# Patient Record
Sex: Male | Born: 1970
Health system: Southern US, Community
[De-identification: ages and names within clinical notes are randomized; demographics above are authoritative.]

## PROBLEM LIST (undated history)

## (undated) DIAGNOSIS — I2510 Atherosclerotic heart disease of native coronary artery without angina pectoris: Secondary | ICD-10-CM

## (undated) DIAGNOSIS — E119 Type 2 diabetes mellitus without complications: Secondary | ICD-10-CM

## (undated) DIAGNOSIS — E785 Hyperlipidemia, unspecified: Secondary | ICD-10-CM

## (undated) DIAGNOSIS — L409 Psoriasis, unspecified: Secondary | ICD-10-CM

## (undated) DIAGNOSIS — K219 Gastro-esophageal reflux disease without esophagitis: Secondary | ICD-10-CM

## (undated) DIAGNOSIS — Z720 Tobacco use: Secondary | ICD-10-CM

## (undated) DIAGNOSIS — I10 Essential (primary) hypertension: Secondary | ICD-10-CM

## (undated) DIAGNOSIS — I219 Acute myocardial infarction, unspecified: Secondary | ICD-10-CM

## (undated) HISTORY — DX: Type 2 diabetes mellitus without complications: E11.9

## (undated) HISTORY — DX: Psoriasis, unspecified: L40.9

## (undated) HISTORY — DX: Acute myocardial infarction, unspecified: I21.9

---

## 1999-05-16 ENCOUNTER — Emergency Department (HOSPITAL_COMMUNITY): Admission: EM | Admit: 1999-05-16 | Discharge: 1999-05-16 | Payer: Self-pay | Admitting: *Deleted

## 2001-07-13 ENCOUNTER — Encounter: Payer: Self-pay | Admitting: Emergency Medicine

## 2001-07-13 ENCOUNTER — Emergency Department (HOSPITAL_COMMUNITY): Admission: EM | Admit: 2001-07-13 | Discharge: 2001-07-13 | Payer: Self-pay | Admitting: Emergency Medicine

## 2003-02-06 ENCOUNTER — Emergency Department (HOSPITAL_COMMUNITY): Admission: EM | Admit: 2003-02-06 | Discharge: 2003-02-06 | Payer: Self-pay | Admitting: Emergency Medicine

## 2004-04-19 ENCOUNTER — Emergency Department (HOSPITAL_COMMUNITY): Admission: EM | Admit: 2004-04-19 | Discharge: 2004-04-19 | Payer: Self-pay | Admitting: Family Medicine

## 2004-04-27 ENCOUNTER — Emergency Department (HOSPITAL_COMMUNITY): Admission: EM | Admit: 2004-04-27 | Discharge: 2004-04-27 | Payer: Self-pay | Admitting: Family Medicine

## 2005-11-08 ENCOUNTER — Emergency Department (HOSPITAL_COMMUNITY): Admission: EM | Admit: 2005-11-08 | Discharge: 2005-11-08 | Payer: Self-pay | Admitting: Emergency Medicine

## 2007-04-28 ENCOUNTER — Emergency Department (HOSPITAL_COMMUNITY): Admission: EM | Admit: 2007-04-28 | Discharge: 2007-04-28 | Payer: Self-pay | Admitting: Family Medicine

## 2008-02-03 ENCOUNTER — Emergency Department (HOSPITAL_COMMUNITY): Admission: EM | Admit: 2008-02-03 | Discharge: 2008-02-03 | Payer: Self-pay | Admitting: Family Medicine

## 2010-03-16 ENCOUNTER — Emergency Department (HOSPITAL_COMMUNITY): Admission: EM | Admit: 2010-03-16 | Discharge: 2010-03-17 | Payer: Self-pay | Admitting: Emergency Medicine

## 2011-05-11 ENCOUNTER — Emergency Department (HOSPITAL_COMMUNITY)
Admission: EM | Admit: 2011-05-11 | Discharge: 2011-05-11 | Disposition: A | Discharge status: Home / Self Care | Payer: Commercial Managed Care - PPO | Attending: Emergency Medicine | Admitting: Emergency Medicine

## 2011-05-11 DIAGNOSIS — R109 Unspecified abdominal pain: Secondary | ICD-10-CM | POA: Insufficient documentation

## 2011-05-11 DIAGNOSIS — R110 Nausea: Secondary | ICD-10-CM | POA: Insufficient documentation

## 2011-05-11 LAB — COMPREHENSIVE METABOLIC PANEL
ALT: 30 U/L (ref 0–53)
AST: 26 U/L (ref 0–37)
Albumin: 3.7 g/dL (ref 3.5–5.2)
Alkaline Phosphatase: 78 U/L (ref 39–117)
BUN: 7 mg/dL (ref 6–23)
CO2: 27 mEq/L (ref 19–32)
Calcium: 9.2 mg/dL (ref 8.4–10.5)
Chloride: 102 mEq/L (ref 96–112)
Creatinine, Ser: 0.86 mg/dL (ref 0.50–1.35)
GFR calc Af Amer: >60 mL/min (ref >60)
GFR calc non Af Amer: >60 mL/min (ref >60)
Glucose, Bld: 106 mg/dL — ABNORMAL HIGH (ref 70–99)
Potassium: 4.1 mEq/L (ref 3.5–5.1)
Sodium: 138 mEq/L (ref 135–145)
Total Bilirubin: 0.3 mg/dL (ref 0.3–1.2)
Total Protein: 7 g/dL (ref 6.0–8.3)

## 2011-05-11 LAB — URINALYSIS, ROUTINE W REFLEX MICROSCOPIC
Glucose, UA: NEGATIVE mg/dL
Leukocytes, UA: NEGATIVE
Protein, ur: NEGATIVE mg/dL
Specific Gravity, Urine: 1.02 (ref 1.005–1.030)
pH: 7.5 (ref 5.0–8.0)

## 2011-05-11 LAB — URINE MICROSCOPIC-ADD ON

## 2011-05-11 LAB — DIFFERENTIAL
Basophils Absolute: 0 10*3/uL (ref 0.0–0.1)
Basophils Relative: 0 % (ref 0–1)
Eosinophils Absolute: 0 10*3/uL (ref 0.0–0.7)
Eosinophils Relative: 0 % (ref 0–5)
Lymphocytes Relative: 29 % (ref 12–46)
Lymphs Abs: 2.5 10*3/uL (ref 0.7–4.0)
Monocytes Absolute: 0.5 10*3/uL (ref 0.1–1.0)
Monocytes Relative: 5 % (ref 3–12)
Neutro Abs: 5.6 10*3/uL (ref 1.7–7.7)
Neutrophils Relative %: 65 % (ref 43–77)

## 2011-05-11 LAB — CBC
HCT: 39.9 % (ref 39.0–52.0)
Hemoglobin: 14.5 g/dL (ref 13.0–17.0)
MCH: 30.7 pg (ref 26.0–34.0)
MCHC: 36.3 g/dL — ABNORMAL HIGH (ref 30.0–36.0)
MCV: 84.5 fL (ref 78.0–100.0)
Platelets: 190 10*3/uL (ref 150–400)
RBC: 4.72 MIL/uL (ref 4.22–5.81)
RDW: 12.2 % (ref 11.5–15.5)
WBC: 8.6 10*3/uL (ref 4.0–10.5)

## 2011-05-12 ENCOUNTER — Emergency Department (HOSPITAL_COMMUNITY): Payer: Commercial Managed Care - PPO

## 2011-05-12 ENCOUNTER — Emergency Department (HOSPITAL_COMMUNITY)
Admission: EM | Admit: 2011-05-12 | Discharge: 2011-05-12 | Disposition: A | Discharge status: Home / Self Care | Payer: Commercial Managed Care - PPO | Attending: Emergency Medicine | Admitting: Emergency Medicine

## 2011-05-12 DIAGNOSIS — R110 Nausea: Secondary | ICD-10-CM | POA: Insufficient documentation

## 2011-05-12 DIAGNOSIS — R109 Unspecified abdominal pain: Secondary | ICD-10-CM | POA: Insufficient documentation

## 2011-05-12 LAB — POCT I-STAT, CHEM 8
Creatinine, Ser: 1.1 mg/dL (ref 0.50–1.35)
HCT: 42 % (ref 39.0–52.0)
Hemoglobin: 14.3 g/dL (ref 13.0–17.0)
Potassium: 3.3 mEq/L — ABNORMAL LOW (ref 3.5–5.1)
Sodium: 139 mEq/L (ref 135–145)
TCO2: 27 mmol/L (ref 0–100)

## 2011-05-12 LAB — CBC
Hemoglobin: 13.4 g/dL (ref 13.0–17.0)
MCH: 29.1 pg (ref 26.0–34.0)
MCHC: 34.2 g/dL (ref 30.0–36.0)
Platelets: 187 10*3/uL (ref 150–400)
RBC: 4.6 MIL/uL (ref 4.22–5.81)

## 2011-05-12 LAB — URINALYSIS, ROUTINE W REFLEX MICROSCOPIC
Hgb urine dipstick: NEGATIVE
Nitrite: NEGATIVE
Protein, ur: NEGATIVE mg/dL
Specific Gravity, Urine: 1.019 (ref 1.005–1.030)
Urobilinogen, UA: 0.2 mg/dL (ref 0.0–1.0)

## 2011-05-12 LAB — DIFFERENTIAL
Basophils Absolute: 0 10*3/uL (ref 0.0–0.1)
Basophils Relative: 0 % (ref 0–1)
Eosinophils Absolute: 0.1 10*3/uL (ref 0.0–0.7)
Monocytes Absolute: 0.5 10*3/uL (ref 0.1–1.0)
Monocytes Relative: 8 % (ref 3–12)
Neutro Abs: 3.7 10*3/uL (ref 1.7–7.7)
Neutrophils Relative %: 54 % (ref 43–77)

## 2012-12-20 DIAGNOSIS — I10 Essential (primary) hypertension: Secondary | ICD-10-CM | POA: Diagnosis present

## 2012-12-20 DIAGNOSIS — I214 Non-ST elevation (NSTEMI) myocardial infarction: Principal | ICD-10-CM | POA: Diagnosis present

## 2012-12-20 DIAGNOSIS — Z8249 Family history of ischemic heart disease and other diseases of the circulatory system: Secondary | ICD-10-CM

## 2012-12-20 DIAGNOSIS — K219 Gastro-esophageal reflux disease without esophagitis: Secondary | ICD-10-CM | POA: Diagnosis present

## 2012-12-20 DIAGNOSIS — F17200 Nicotine dependence, unspecified, uncomplicated: Secondary | ICD-10-CM | POA: Diagnosis present

## 2012-12-20 DIAGNOSIS — Z7982 Long term (current) use of aspirin: Secondary | ICD-10-CM

## 2012-12-20 DIAGNOSIS — I2510 Atherosclerotic heart disease of native coronary artery without angina pectoris: Secondary | ICD-10-CM | POA: Diagnosis present

## 2012-12-20 DIAGNOSIS — E785 Hyperlipidemia, unspecified: Secondary | ICD-10-CM | POA: Diagnosis present

## 2012-12-20 DIAGNOSIS — Z79899 Other long term (current) drug therapy: Secondary | ICD-10-CM

## 2012-12-20 NOTE — ED Notes (Signed)
Pt states for last month he has been having burning CP every other day.  Currently pain has come every day x 3 days. Pain intermittent. Pain exacerbated by exertion (walking). Activity at time of onset was sitting watching movie.

## 2012-12-20 NOTE — ED Notes (Signed)
Pain currently resolved

## 2012-12-21 ENCOUNTER — Encounter (HOSPITAL_COMMUNITY)
Admission: EM | Disposition: A | Discharge status: Home / Self Care | Payer: Self-pay | Source: Home / Self Care | Attending: Internal Medicine

## 2012-12-21 ENCOUNTER — Emergency Department (HOSPITAL_COMMUNITY)
Admit: 2012-12-21 | Discharge: 2012-12-21 | Disposition: A | Discharge status: Home / Self Care | Payer: Self-pay | Attending: Emergency Medicine | Admitting: Emergency Medicine

## 2012-12-21 ENCOUNTER — Encounter (HOSPITAL_COMMUNITY): Payer: Self-pay | Admitting: Internal Medicine

## 2012-12-21 ENCOUNTER — Inpatient Hospital Stay (HOSPITAL_COMMUNITY)
Admission: EM | Admit: 2012-12-21 | Discharge: 2012-12-22 | DRG: 247 | Disposition: A | Discharge status: Home / Self Care | Payer: Commercial Managed Care - PPO | Attending: Internal Medicine | Admitting: Internal Medicine

## 2012-12-21 ENCOUNTER — Other Ambulatory Visit: Payer: Self-pay

## 2012-12-21 DIAGNOSIS — I2510 Atherosclerotic heart disease of native coronary artery without angina pectoris: Secondary | ICD-10-CM

## 2012-12-21 DIAGNOSIS — I10 Essential (primary) hypertension: Secondary | ICD-10-CM | POA: Diagnosis present

## 2012-12-21 DIAGNOSIS — Z720 Tobacco use: Secondary | ICD-10-CM | POA: Diagnosis present

## 2012-12-21 DIAGNOSIS — E785 Hyperlipidemia, unspecified: Secondary | ICD-10-CM

## 2012-12-21 DIAGNOSIS — Z955 Presence of coronary angioplasty implant and graft: Secondary | ICD-10-CM

## 2012-12-21 DIAGNOSIS — K219 Gastro-esophageal reflux disease without esophagitis: Secondary | ICD-10-CM | POA: Diagnosis present

## 2012-12-21 DIAGNOSIS — I214 Non-ST elevation (NSTEMI) myocardial infarction: Secondary | ICD-10-CM

## 2012-12-21 DIAGNOSIS — R079 Chest pain, unspecified: Secondary | ICD-10-CM

## 2012-12-21 DIAGNOSIS — I252 Old myocardial infarction: Secondary | ICD-10-CM | POA: Diagnosis present

## 2012-12-21 HISTORY — PX: LEFT HEART CATHETERIZATION WITH CORONARY ANGIOGRAM: SHX5451

## 2012-12-21 HISTORY — DX: Atherosclerotic heart disease of native coronary artery without angina pectoris: I25.10

## 2012-12-21 HISTORY — DX: Tobacco use: Z72.0

## 2012-12-21 HISTORY — PX: CORONARY ANGIOPLASTY WITH STENT PLACEMENT: SHX49

## 2012-12-21 HISTORY — DX: Essential (primary) hypertension: I10

## 2012-12-21 HISTORY — DX: Hyperlipidemia, unspecified: E78.5

## 2012-12-21 HISTORY — DX: Gastro-esophageal reflux disease without esophagitis: K21.9

## 2012-12-21 LAB — POCT I-STAT TROPONIN I: Troponin i, poc: 0.18 ng/mL (ref 0.00–0.08)

## 2012-12-21 LAB — TROPONIN I: Troponin I: 0.52 ng/mL (ref <0.30)

## 2012-12-21 LAB — LIPID PANEL
Cholesterol: 153 mg/dL (ref 0–200)
Triglycerides: 71 mg/dL (ref <150)
VLDL: 14 mg/dL (ref 0–40)

## 2012-12-21 LAB — PROTIME-INR: INR: 0.93 (ref 0.00–1.49)

## 2012-12-21 LAB — PRO B NATRIURETIC PEPTIDE
Pro B Natriuretic peptide (BNP): 77.4 pg/mL (ref 0–125)
Pro B Natriuretic peptide (BNP): 81.6 pg/mL (ref 0–125)

## 2012-12-21 LAB — COMPREHENSIVE METABOLIC PANEL
Alkaline Phosphatase: 81 U/L (ref 39–117)
BUN: 11 mg/dL (ref 6–23)
Chloride: 103 mEq/L (ref 96–112)
GFR calc Af Amer: >90 mL/min (ref >90)
Glucose, Bld: 97 mg/dL (ref 70–99)
Potassium: 3.9 mEq/L (ref 3.5–5.1)
Total Bilirubin: 0.3 mg/dL (ref 0.3–1.2)
Total Protein: 6.5 g/dL (ref 6.0–8.3)

## 2012-12-21 LAB — CBC
MCH: 30.3 pg (ref 26.0–34.0)
MCV: 84.4 fL (ref 78.0–100.0)
Platelets: 192 10*3/uL (ref 150–400)
Platelets: 223 10*3/uL (ref 150–400)
RBC: 4.62 MIL/uL (ref 4.22–5.81)
RDW: 12.6 % (ref 11.5–15.5)
RDW: 12.6 % (ref 11.5–15.5)
WBC: 8.2 10*3/uL (ref 4.0–10.5)

## 2012-12-21 LAB — BASIC METABOLIC PANEL
CO2: 27 mEq/L (ref 19–32)
Chloride: 100 mEq/L (ref 96–112)
GFR calc Af Amer: >90 mL/min (ref >90)
Potassium: 3.3 mEq/L — ABNORMAL LOW (ref 3.5–5.1)

## 2012-12-21 SURGERY — LEFT HEART CATHETERIZATION WITH CORONARY ANGIOGRAM
Anesthesia: LOCAL

## 2012-12-21 MED ORDER — BIVALIRUDIN 250 MG IV SOLR
250.0000 mg | INTRAVENOUS | Status: DC
  Filled 2012-12-21: qty 250

## 2012-12-21 MED ORDER — DIAZEPAM 2 MG PO TABS
2.0000 mg | ORAL_TABLET | ORAL | Status: AC
  Administered 2012-12-21: 2 mg via ORAL
  Filled 2012-12-21: qty 1

## 2012-12-21 MED ORDER — HEPARIN BOLUS VIA INFUSION
4000.0000 [IU] | Freq: Once | INTRAVENOUS | Status: AC
  Administered 2012-12-21 (×2): 4000 [IU] via INTRAVENOUS

## 2012-12-21 MED ORDER — PANTOPRAZOLE SODIUM 40 MG PO TBEC
40.0000 mg | DELAYED_RELEASE_TABLET | Freq: Every day | ORAL | Status: DC
  Administered 2012-12-21 – 2012-12-22 (×2): 40 mg via ORAL
  Filled 2012-12-21 (×2): qty 1

## 2012-12-21 MED ORDER — NITROGLYCERIN 0.4 MG SL SUBL: 0.4000 mg | SUBLINGUAL_TABLET | SUBLINGUAL | Status: DC | PRN

## 2012-12-21 MED ORDER — SODIUM CHLORIDE 0.9 % IV SOLN: 250.0000 mL | INTRAVENOUS | Status: DC | PRN

## 2012-12-21 MED ORDER — SODIUM CHLORIDE 0.9 % IV SOLN: 1.0000 mL/kg/h | INTRAVENOUS | Status: DC

## 2012-12-21 MED ORDER — ONDANSETRON HCL 4 MG/2ML IJ SOLN
4.0000 mg | Freq: Four times a day (QID) | INTRAMUSCULAR | Status: DC | PRN
  Administered 2012-12-21 – 2012-12-22 (×2): 4 mg via INTRAVENOUS
  Filled 2012-12-21 (×2): qty 2

## 2012-12-21 MED ORDER — LIDOCAINE HCL (PF) 1 % IJ SOLN
INTRAMUSCULAR | Status: AC
  Filled 2012-12-21: qty 30

## 2012-12-21 MED ORDER — OMEPRAZOLE MAGNESIUM 20 MG PO TBEC: 40.0000 mg | DELAYED_RELEASE_TABLET | Freq: Every day | ORAL | Status: DC

## 2012-12-21 MED ORDER — HEPARIN SODIUM (PORCINE) 1000 UNIT/ML IJ SOLN
INTRAMUSCULAR | Status: AC
  Filled 2012-12-21: qty 1

## 2012-12-21 MED ORDER — FENTANYL CITRATE 0.05 MG/ML IJ SOLN
INTRAMUSCULAR | Status: AC
  Filled 2012-12-21: qty 2

## 2012-12-21 MED ORDER — MIDAZOLAM HCL 2 MG/2ML IJ SOLN
INTRAMUSCULAR | Status: AC
  Filled 2012-12-21: qty 2

## 2012-12-21 MED ORDER — NITROGLYCERIN 1 MG/10 ML FOR IR/CATH LAB
INTRA_ARTERIAL | Status: AC
  Filled 2012-12-21: qty 10

## 2012-12-21 MED ORDER — VERAPAMIL HCL 2.5 MG/ML IV SOLN
INTRAVENOUS | Status: AC
  Filled 2012-12-21: qty 2

## 2012-12-21 MED ORDER — ACETAMINOPHEN 325 MG PO TABS
650.0000 mg | ORAL_TABLET | ORAL | Status: DC | PRN
  Administered 2012-12-21 – 2012-12-22 (×2): 650 mg via ORAL
  Filled 2012-12-21 (×2): qty 2

## 2012-12-21 MED ORDER — ASPIRIN 81 MG PO CHEW
324.0000 mg | CHEWABLE_TABLET | ORAL | Status: AC
  Administered 2012-12-21: 324 mg via ORAL
  Filled 2012-12-21: qty 4

## 2012-12-21 MED ORDER — HYDRALAZINE HCL 20 MG/ML IJ SOLN
INTRAMUSCULAR | Status: AC
  Administered 2012-12-22: 10 mg via INTRAVENOUS
  Filled 2012-12-21: qty 1

## 2012-12-21 MED ORDER — SODIUM CHLORIDE 0.9 % IJ SOLN: 3.0000 mL | INTRAMUSCULAR | Status: DC | PRN

## 2012-12-21 MED ORDER — POTASSIUM CHLORIDE CRYS ER 20 MEQ PO TBCR
EXTENDED_RELEASE_TABLET | ORAL | Status: AC
  Filled 2012-12-21: qty 1

## 2012-12-21 MED ORDER — METOPROLOL TARTRATE 12.5 MG HALF TABLET
12.5000 mg | ORAL_TABLET | Freq: Two times a day (BID) | ORAL | Status: DC
  Administered 2012-12-21: 12.5 mg via ORAL
  Filled 2012-12-21 (×2): qty 1

## 2012-12-21 MED ORDER — SODIUM CHLORIDE 0.9 % IV SOLN: INTRAVENOUS | Status: AC

## 2012-12-21 MED ORDER — PRASUGREL HCL 10 MG PO TABS
ORAL_TABLET | ORAL | Status: AC
  Filled 2012-12-21: qty 6

## 2012-12-21 MED ORDER — PRASUGREL HCL 10 MG PO TABS
10.0000 mg | ORAL_TABLET | Freq: Every day | ORAL | Status: DC
  Administered 2012-12-22: 09:00:00 10 mg via ORAL
  Filled 2012-12-21: qty 1

## 2012-12-21 MED ORDER — HEPARIN (PORCINE) IN NACL 100-0.45 UNIT/ML-% IJ SOLN
1000.0000 [IU]/h | INTRAMUSCULAR | Status: DC
  Administered 2012-12-21: 1000 [IU]/h via INTRAVENOUS
  Filled 2012-12-21 (×2): qty 250

## 2012-12-21 MED ORDER — ASPIRIN EC 81 MG PO TBEC
81.0000 mg | DELAYED_RELEASE_TABLET | Freq: Every day | ORAL | Status: DC
  Administered 2012-12-22: 09:00:00 81 mg via ORAL
  Filled 2012-12-21: qty 1

## 2012-12-21 MED ORDER — ASPIRIN 81 MG PO CHEW: 324.0000 mg | CHEWABLE_TABLET | Freq: Once | ORAL | Status: DC

## 2012-12-21 MED ORDER — POTASSIUM CHLORIDE CRYS ER 20 MEQ PO TBCR
20.0000 meq | EXTENDED_RELEASE_TABLET | Freq: Once | ORAL | Status: AC
  Administered 2012-12-21: 20 meq via ORAL

## 2012-12-21 MED ORDER — HYDRALAZINE HCL 20 MG/ML IJ SOLN
10.0000 mg | INTRAMUSCULAR | Status: DC | PRN
  Administered 2012-12-21: 17:00:00 10 mg via INTRAVENOUS
  Filled 2012-12-21 (×2): qty 0.5

## 2012-12-21 MED ORDER — ZOLPIDEM TARTRATE 5 MG PO TABS: 5.0000 mg | ORAL_TABLET | Freq: Every evening | ORAL | Status: DC | PRN

## 2012-12-21 MED ORDER — BIVALIRUDIN 250 MG IV SOLR
INTRAVENOUS | Status: AC
  Filled 2012-12-21: qty 500

## 2012-12-21 MED ORDER — SODIUM CHLORIDE 0.9 % IJ SOLN: 3.0000 mL | Freq: Two times a day (BID) | INTRAMUSCULAR | Status: DC

## 2012-12-21 MED ORDER — ATORVASTATIN CALCIUM 80 MG PO TABS
80.0000 mg | ORAL_TABLET | Freq: Every day | ORAL | Status: DC
  Administered 2012-12-21: 80 mg via ORAL
  Filled 2012-12-21 (×4): qty 1

## 2012-12-21 MED ORDER — OXYCODONE-ACETAMINOPHEN 5-325 MG PO TABS: 1.0000 | ORAL_TABLET | ORAL | Status: DC | PRN

## 2012-12-21 MED ORDER — HEPARIN (PORCINE) IN NACL 2-0.9 UNIT/ML-% IJ SOLN
INTRAMUSCULAR | Status: AC
  Filled 2012-12-21: qty 1000

## 2012-12-21 MED ORDER — ASPIRIN 81 MG PO CHEW
324.0000 mg | CHEWABLE_TABLET | Freq: Once | ORAL | Status: AC
  Administered 2012-12-21: 324 mg via ORAL
  Filled 2012-12-21: qty 4

## 2012-12-21 MED ORDER — MORPHINE SULFATE 2 MG/ML IJ SOLN: 2.0000 mg | INTRAMUSCULAR | Status: DC | PRN

## 2012-12-21 MED ORDER — LISINOPRIL 10 MG PO TABS
10.0000 mg | ORAL_TABLET | Freq: Every day | ORAL | Status: DC
  Administered 2012-12-21: 10 mg via ORAL
  Filled 2012-12-21: qty 1

## 2012-12-21 MED ORDER — LISINOPRIL 20 MG PO TABS
20.0000 mg | ORAL_TABLET | Freq: Every day | ORAL | Status: DC
  Filled 2012-12-21: qty 1

## 2012-12-21 MED ORDER — METOPROLOL TARTRATE 25 MG PO TABS
25.0000 mg | ORAL_TABLET | Freq: Two times a day (BID) | ORAL | Status: DC
  Administered 2012-12-21 – 2012-12-22 (×2): 25 mg via ORAL
  Filled 2012-12-21 (×3): qty 1

## 2012-12-21 NOTE — H&P (View-Only) (Signed)
   Subjective:  Denies CP or dyspnea   Objective:  Filed Vitals:   12/21/12 0039 12/21/12 0200 12/21/12 0244 12/21/12 0422  BP: 152/85 157/84 153/58 180/101  Pulse: 78 67 77 58  Temp:    98.2 F (36.8 C)  TempSrc:    Oral  Resp: 14 19 14 18   Height:    5\' 8"  (1.727 m)  Weight:    194 lb 10.7 oz (88.3 kg)  SpO2: 99% 97% 100% 100%    Intake/Output from previous day: No intake or output data in the 24 hours ending 12/21/12 1610  Physical Exam: Physical exam: Well-developed well-nourished in no acute distress.  Skin is warm and dry.  HEENT is normal.  Neck is supple.  Chest is clear to auscultation with normal expansion.  Cardiovascular exam is regular rate and rhythm.  Abdominal exam nontender or distended. No masses palpated. Extremities show no edema. neuro grossly intact    Lab Results: Basic Metabolic Panel:  Recent Labs  96/04/54 2348 12/21/12 0530  NA 137 138  K 3.3* 3.9  CL 100 103  CO2 27 28  GLUCOSE 82 97  BUN 12 11  CREATININE 0.99 0.85  CALCIUM 9.0 9.0   CBC:  Recent Labs  12/20/12 2348 12/21/12 0530  WBC 8.2 5.5  HGB 14.2 13.0  HCT 39.1 36.2*  MCV 84.6 84.4  PLT 223 192   Cardiac Enzymes:  Recent Labs  12/21/12 0131 12/21/12 0530  TROPONINI 0.52* 0.37*     Assessment/Plan:  1 non-ST elevation myocardial infarction-patient's symptoms are consistent with an acute coronary syndrome. He has had exertional chest pain for one month relieved with rest. It has progressed to rest pain. Troponin is abnormal. Presently pain free. Plan cardiac catheterization. The risks and benefits were discussed and the patient agrees to proceed. Continue aspirin, beta blocker and statin. Check urine drug screen although patient denies cocaine use. 2 tobacco abuse-patient counseled on discontinuing. 3 hypertension-follow blood pressure; elevated this a.m. Add lisinopril.  Olga Millers 12/21/2012, 7:13 AM

## 2012-12-21 NOTE — H&P (Signed)
Bradley Wolfe. is an 42 y.o. male.   Chief Complaint: chest pain HPI: 42 yo man with pmh of Hypertension and tobacco abuse who has had intermittent chest pain over the last month. He characterizes the pain as a pressure sensation, some associated SOB and exertional chest pain at times but not always. He has pressure particularly at night when he is laying down. He thought he might have GERD and attempt tums but no improvement in pain. Given increase in pain he presented to the ER.  Currently CP free. He also has psoriasis, father with MI in 63s and deceased. Mother has had multiple MIs.    No past medical history on file.  No past surgical history on file.  No family history on file. Social History:  reports that he has been smoking.  He does not have any smokeless tobacco history on file. He reports that he drinks about 0.5 ounces of alcohol per week. He reports that he does not use illicit drugs.  Allergies: No Known Allergies   (Not in a hospital admission)  Results for orders placed during the hospital encounter of 12/21/12 (from the past 48 hour(s))  CBC     Status: Abnormal   Collection Time    12/20/12 11:48 PM      Result Value Range   WBC 8.2  4.0 - 10.5 K/uL   RBC 4.62  4.22 - 5.81 MIL/uL   Hemoglobin 14.2  13.0 - 17.0 g/dL   HCT 16.1  09.6 - 04.5 %   MCV 84.6  78.0 - 100.0 fL   MCH 30.7  26.0 - 34.0 pg   MCHC 36.3 (*) 30.0 - 36.0 g/dL   RDW 40.9  81.1 - 91.4 %   Platelets 223  150 - 400 K/uL  BASIC METABOLIC PANEL     Status: Abnormal   Collection Time    12/20/12 11:48 PM      Result Value Range   Sodium 137  135 - 145 mEq/L   Potassium 3.3 (*) 3.5 - 5.1 mEq/L   Chloride 100  96 - 112 mEq/L   CO2 27  19 - 32 mEq/L   Glucose, Bld 82  70 - 99 mg/dL   BUN 12  6 - 23 mg/dL   Creatinine, Ser 7.82  0.50 - 1.35 mg/dL   Calcium 9.0  8.4 - 95.6 mg/dL   GFR calc non Af Amer >90  >90 mL/min   GFR calc Af Amer >90  >90 mL/min   Comment:            The eGFR has  been calculated     using the CKD EPI equation.     This calculation has not been     validated in all clinical     situations.     eGFR's persistently     <90 mL/min signify     possible Chronic Kidney Disease.  PRO B NATRIURETIC PEPTIDE     Status: None   Collection Time    12/20/12 11:48 PM      Result Value Range   Pro B Natriuretic peptide (BNP) 81.6  0 - 125 pg/mL  POCT I-STAT TROPONIN I     Status: Abnormal   Collection Time    12/20/12 11:57 PM      Result Value Range   Troponin i, poc 0.18 (*) 0.00 - 0.08 ng/mL   Comment NOTIFIED PHYSICIAN     Comment 3  Comment: Due to the release kinetics of cTnI,     a negative result within the first hours     of the onset of symptoms does not rule out     myocardial infarction with certainty.     If myocardial infarction is still suspected,     repeat the test at appropriate intervals.  TROPONIN I     Status: Abnormal   Collection Time    12/21/12  1:31 AM      Result Value Range   Troponin I 0.52 (*) <0.30 ng/mL   Comment:            Due to the release kinetics of cTnI,     a negative result within the first hours     of the onset of symptoms does not rule out     myocardial infarction with certainty.     If myocardial infarction is still suspected,     repeat the test at appropriate intervals.     CRITICAL RESULT CALLED TO, READ BACK BY AND VERIFIED WITH:     SANGALANG,R RN 12/21/2012 0216 JORDANS   Dg Chest 2 View  12/21/2012  *RADIOLOGY REPORT*  Clinical Data: Mid chest pain for 3 days.  CHEST - 2 VIEW  Comparison: None.  Findings: Slightly shallow inspiration with elevation of the left hemidiaphragm.  Heart size and pulmonary vascularity are normal for technique.  No focal consolidation or airspace disease in the lungs.  No blunting of costophrenic angles.  No pneumothorax. Mediastinal contours appear intact.  IMPRESSION: No evidence of active pulmonary disease.   Original Report Authenticated By: Burman Nieves, M.D.     Review of Systems  Constitutional: Negative for fever and chills.       Recent cold  HENT: Negative for hearing loss and neck pain.   Eyes: Negative for blurred vision, photophobia and pain.  Respiratory: Positive for shortness of breath. Negative for hemoptysis and sputum production.   Cardiovascular: Positive for chest pain. Negative for palpitations, orthopnea and claudication.  Gastrointestinal: Positive for heartburn. Negative for nausea, vomiting and abdominal pain.  Genitourinary: Negative for dysuria, urgency and frequency.  Musculoskeletal: Negative for myalgias.  Skin: Negative for itching and rash.  Neurological: Negative for dizziness, tingling, tremors and headaches.  Psychiatric/Behavioral: Negative for depression, suicidal ideas and substance abuse.    Blood pressure 157/84, pulse 67, resp. rate 19, SpO2 97.00%. Physical Exam  Nursing note and vitals reviewed. Constitutional: He is oriented to person, place, and time. He appears well-developed and well-nourished. No distress.  HENT:  Head: Normocephalic and atraumatic.  Nose: Nose normal.  Mouth/Throat: Oropharynx is clear and moist. No oropharyngeal exudate.  Eyes: Conjunctivae and EOM are normal. Pupils are equal, round, and reactive to light. No scleral icterus.  Neck: Normal range of motion. Neck supple. No JVD present. No tracheal deviation present. No thyromegaly present.  Cardiovascular: Normal rate, regular rhythm, normal heart sounds and intact distal pulses.  Exam reveals no gallop.   No murmur heard. Respiratory: Effort normal and breath sounds normal. No respiratory distress. He has no wheezes.  GI: Soft. Bowel sounds are normal. He exhibits no distension. There is no tenderness.  Musculoskeletal: Normal range of motion. He exhibits no edema and no tenderness.  Neurological: He is alert and oriented to person, place, and time. No cranial nerve deficit. Coordination normal.  Skin: Skin is  warm. He is not diaphoretic. No erythema.  Psychiatric: He has a normal mood and affect. His behavior  is normal.     Labs reviewed; wbc 8.2, h/h 14.2/39.1, plt 223, na 137, K 3.3, bun/cr 12/0.99, troponin 0.18, bnp 82 EKG sinus bradycardia; inferior twi, ? LVH Problem List Chest pain NSTEMI Hypertension Tobacco abuse + family history of early CAD Psoriasis Assessment/Plan 42 yo man with PMH of hypertension and tobacco abuse who comes in with chest pain. Differential is ACS, pericarditis (improved with sitting up, worse with laying back but currently CP free), pneumothorax, prinzmetal's angina, costochondritis among other etiologies. Given very early CAD in father (54s with several MIs, mother with MIs), psoriasis, smoker, pressure component CP and elevated troponin, I favor NSTEMI/ACS and would proceed to cath lab in AM. Will make NPO, aspirin, heparin for now.  - continue heparin gtt, trend troponins, aspirin 324 mg given, asa 81 mg daily now - likely LHC in AM given risk factors, + troponin, + chest pain and subtle ECG changes - NPO after MN - atorvastatin 80 mg qHS - tsh, hba1c, lipid panel - smoking cessation counseling started already - beta-blocker as able, start ace- if able with bp room  Sheray Grist 12/21/2012, 2:32 AM

## 2012-12-21 NOTE — Progress Notes (Signed)
   Subjective:  Denies CP or dyspnea   Objective:  Filed Vitals:   12/21/12 0039 12/21/12 0200 12/21/12 0244 12/21/12 0422  BP: 152/85 157/84 153/58 180/101  Pulse: 78 67 77 58  Temp:    98.2 F (36.8 C)  TempSrc:    Oral  Resp: 14 19 14 18  Height:    5' 8" (1.727 m)  Weight:    194 lb 10.7 oz (88.3 kg)  SpO2: 99% 97% 100% 100%    Intake/Output from previous day: No intake or output data in the 24 hours ending 12/21/12 0713  Physical Exam: Physical exam: Well-developed well-nourished in no acute distress.  Skin is warm and dry.  HEENT is normal.  Neck is supple.  Chest is clear to auscultation with normal expansion.  Cardiovascular exam is regular rate and rhythm.  Abdominal exam nontender or distended. No masses palpated. Extremities show no edema. neuro grossly intact    Lab Results: Basic Metabolic Panel:  Recent Labs  12/20/12 2348 12/21/12 0530  NA 137 138  K 3.3* 3.9  CL 100 103  CO2 27 28  GLUCOSE 82 97  BUN 12 11  CREATININE 0.99 0.85  CALCIUM 9.0 9.0   CBC:  Recent Labs  12/20/12 2348 12/21/12 0530  WBC 8.2 5.5  HGB 14.2 13.0  HCT 39.1 36.2*  MCV 84.6 84.4  PLT 223 192   Cardiac Enzymes:  Recent Labs  12/21/12 0131 12/21/12 0530  TROPONINI 0.52* 0.37*     Assessment/Plan:  1 non-ST elevation myocardial infarction-patient's symptoms are consistent with an acute coronary syndrome. He has had exertional chest pain for one month relieved with rest. It has progressed to rest pain. Troponin is abnormal. Presently pain free. Plan cardiac catheterization. The risks and benefits were discussed and the patient agrees to proceed. Continue aspirin, beta blocker and statin. Check urine drug screen although patient denies cocaine use. 2 tobacco abuse-patient counseled on discontinuing. 3 hypertension-follow blood pressure; elevated this a.m. Add lisinopril.  Toa Mia 12/21/2012, 7:13 AM    

## 2012-12-21 NOTE — Progress Notes (Signed)
TR BAND REMOVAL  LOCATION:    right radial  DEFLATED PER PROTOCOL:    yes  TIME BAND OFF / DRESSING APPLIED:    1845   SITE UPON ARRIVAL:    Level 0  SITE AFTER BAND REMOVAL:    Level 0  REVERSE ALLEN'S TEST:     positive  CIRCULATION SENSATION AND MOVEMENT:    Within Normal Limits   yes  COMMENTS:   Tolerated procedure well 

## 2012-12-21 NOTE — Progress Notes (Signed)
Utilization Review Completed.Keriann Rankin T2/27/2014  

## 2012-12-21 NOTE — ED Notes (Signed)
Critical labs shown to Dr.Allen

## 2012-12-21 NOTE — CV Procedure (Signed)
Cardiac Catheterization Operative Report  Sher Hellinger [id number] 2/27/20142:58 PM [name]) Sofie Hartigan, MD  Procedure Performed:  1. Left Heart Catheterization 2. Selective Coronary Angiography 3. Left ventricular angiogram 4. PTCA/DES x 1 mid RCA 5. PTCA/DES x 1 mid LAD  Operator: Verne Carrow, MD  Arterial access site:  Right radial artery.   Indication: 42 yo male with history of HTN, tobacco abuse and FH of CAD admitted with NSTEMI.                                       Procedure Details: The risks, benefits, complications, treatment options, and expected outcomes were discussed with the patient. The patient and/or family concurred with the proposed plan, giving informed consent. The patient was brought to the cath lab after IV hydration was begun and oral premedication was given. The patient was further sedated with Versed and Fentanyl. The right wrist was assessed with an Allens test which was positive. The right wrist was prepped and draped in a sterile fashion. 1% lidocaine was used for local anesthesia. Using the modified Seldinger access technique, a 5 French sheath was placed in the right radial artery. 3 mg Verapamil was given through the sheath. 4500 units IV heparin was given. Standard diagnostic catheters were used to perform selective coronary angiography. A pigtail catheter was used to perform a left ventricular angiogram. The patient was found to have a totally occluded mid RCA and a severe stenosis in the mid LAD. We elected to proceed to intervention.   The sheath was exchanged for a 6 [place] system. He was given 60 mg Effient po x 1. A bolus of Angiomax was given and a drip was started. I then engaged the RCA with a JR4 guiding catheter. When the ACT was greater than 200, I passed a BMW wire down the RCA. I then used a 2.5 x 12 mm balloon to pre-dilate the mid vessel in the totally occluded segment. Flow was re-established down the  vessel. I then deployed a 3.5 x 28 mm Xience Xpedition DES in the mid RCA. This was post-dilated with a 3.75 x 20 mm Sherrill balloon x 2. The stenosis was taken from 100% down to 0%. There were no immediate complications. There was excellent flow into the distal vessel with moderate distal disease noted.   I then removed the JR4 guiding catheter and engaged the left main with a JL3.5 guiding catheter. I passed a BMW wire down the LAD. I then pre-dilated the mid stenosis with a 2.5 x 12 mm balloon. I then deployed a 2.75 x 23 mm Xience Xpedition DES in the mid LAD. This was post-dilated with a 3.0 x 15 mm Sarepta balloon x 1. The stenosis was taken from 90% down to 0%. There was excellent flow into the distal vessel.   The sheath was removed from the right radial artery and a Terumo hemostasis band was applied at the arteriotomy site on the right wrist.   There were no immediate complications. The patient was taken to the recovery area in stable condition.   Hemodynamic Findings: Central aortic pressure: 150/87 Left ventricular pressure: 148/9/17  Angiographic Findings:  Left main: No obstructive disease noted.   Left Anterior Descending Artery: Large caliber vessel that courses to the apex. The mid vessel has a 90% stenosis just after the takeoff of a moderate sized diagonal branch. No other  obstructive disease noted in the LAD or Diagonal branches.   Circumflex Artery: Moderate caliber vessel with two moderate caliber marginal branches. The first OM branch has 20% stenosis.   Right Coronary Artery: Large, dominant vessel with 100% mid occlusion.   Left Ventricular Angiogram: LVEF=55-60%  Impression: 1. Severe double vessel CAD 2. Total occlusion of the mid RCA, now s/p successful PCI with DES x 1 mid RCA 3. Severe stenosis mid LAD, now s/p successful PCI with DES x 1 mid LAD 4. Normal LV systolic function  Recommendations: He will need dual anti-platelet therapy with ASA and Effient for at least  one year. Continue statin, beta blocker. Tobacco cessation.        Complications:  None. The patient tolerated the procedure well.

## 2012-12-21 NOTE — Progress Notes (Signed)
ANTICOAGULATION CONSULT NOTE - Initial Consult  Pharmacy Consult for Heparin Indication: chest pain/ACS  No Known Allergies  Patient Measurements: Height: 5\' 8"  (172.7 cm) Weight: 194 lb 10.7 oz (88.3 kg) IBW/kg (Calculated) : 68.4 Heparin Dosing Weight: 85 kg   Vital Signs: Temp: 98.2 F (36.8 C) (02/27 0422) Temp src: Oral (02/27 0422) BP: 180/101 mmHg (02/27 0422) Pulse Rate: 58 (02/27 0422)  Labs:  Recent Labs  12/20/12 2348 12/21/12 0131  HGB 14.2  --   HCT 39.1  --   PLT 223  --   CREATININE 0.99  --   TROPONINI  --  0.52*    Estimated Creatinine Clearance: 105 ml/min (by C-G formula based on Cr of 0.99).   Medical History: Past Medical History  Diagnosis Date  . Hypertension     Medications:  Prescriptions prior to admission  Medication Sig Dispense Refill  . omeprazole (PRILOSEC OTC) 20 MG tablet Take 20 mg by mouth daily.        Assessment: 42 yo male with chest pain for heparin.  Heparin 4000 units IV bolus and 1000 units/hr started in ED at 0130  Goal of Therapy:  Heparin level 0.3-0.7 units/ml Monitor platelets by anticoagulation protocol: Yes   Plan:  Continue Heparin at current rate  Check heparin level in 6 hours. Eddie Candle 12/21/2012,4:34 AM

## 2012-12-21 NOTE — ED Provider Notes (Signed)
History     CSN: [id number]  Arrival date & time 12/20/12  2332      Chief Complaint  Patient presents with  . Chest Pain    (Consider location/radiation/quality/duration/timing/severity/associated sxs/prior treatment) The history is limited by the condition of the patient.   Hx per PT. CP for the last month, burning pain across his chest with associated SOB, especially with exertion and when he walks.  Tonight at home, recurrent pain around 11:30.  Now in the ED his pain is better.  No arm pain, neck pain or back pain, no leg pain or swelling.  He was previously taking prilosec and attributed his pain to acid reflux.  He has not been evaluated for this until today.  At time of evaluation is pain free. Symptoms were mod in severity. Now 0/10.  No past medical history on file.  No past surgical history on file.  No family history on file.  History  Substance Use Topics  . Smoking status: Not on file  . Smokeless tobacco: Not on file  . Alcohol Use: Not on file      Review of Systems  Constitutional: Negative for fever and chills.  HENT: Negative for neck pain and neck stiffness.   Eyes: Negative for pain.  Respiratory: Negative for shortness of breath.   Cardiovascular: Positive for chest pain.  Gastrointestinal: Negative for abdominal pain.  Genitourinary: Negative for dysuria.  Musculoskeletal: Negative for back pain.  Skin: Negative for rash.  Neurological: Negative for headaches.  All other systems reviewed and are negative.    Allergies  Review of patient's allergies indicates no known allergies.  Home Medications   Current Outpatient Rx  Name  Route  Sig  Dispense  Refill  . omeprazole (PRILOSEC OTC) 20 MG tablet   Oral   Take 20 mg by mouth daily.           BP 152/85  Pulse 78  Resp 14  SpO2 99%  Physical Exam  Constitutional: He is oriented to person, place, and time. He appears well-developed and well-nourished.  HENT:  Head:  Normocephalic and atraumatic.  Eyes: Conjunctivae and EOM are normal. Pupils are equal, round, and reactive to light.  Neck: Trachea normal. Neck supple. No thyromegaly present.  Cardiovascular: Normal rate, regular rhythm, S1 normal, S2 normal and normal pulses.     No systolic murmur is present   No diastolic murmur is present  Pulses:      Radial pulses are 2+ on the right side, and 2+ on the left side.  Pulmonary/Chest: Effort normal and breath sounds normal. He has no wheezes. He has no rhonchi. He has no rales. He exhibits no tenderness.  Abdominal: Soft. Normal appearance and bowel sounds are normal. There is no tenderness. There is no CVA tenderness and negative Murphy's sign.  Musculoskeletal:  BLE:s Calves nontender, no cords or erythema, negative Homans sign  Neurological: He is alert and oriented to person, place, and time. He has normal strength. No cranial nerve deficit or sensory deficit. GCS eye subscore is 4. GCS verbal subscore is 5. GCS motor subscore is 6.  Skin: Skin is warm and dry. No rash noted. He is not diaphoretic.  Psychiatric: His speech is normal.  Cooperative and appropriate    ED Course  Procedures (including critical care time)  Results for orders placed during the hospital encounter of 12/21/12  CBC      Result Value Range   WBC 8.2  4.0 - 10.5  K/uL   RBC 4.62  4.22 - 5.81 MIL/uL   Hemoglobin 14.2  13.0 - 17.0 g/dL   HCT 11.9  14.7 - 82.9 %   MCV 84.6  78.0 - 100.0 fL   MCH 30.7  26.0 - 34.0 pg   MCHC 36.3 (*) 30.0 - 36.0 g/dL   RDW 56.2  13.0 - 86.5 %   Platelets 223  150 - 400 K/uL  BASIC METABOLIC PANEL      Result Value Range   Sodium 137  135 - 145 mEq/L   Potassium 3.3 (*) 3.5 - 5.1 mEq/L   Chloride 100  96 - 112 mEq/L   CO2 27  19 - 32 mEq/L   Glucose, Bld 82  70 - 99 mg/dL   BUN 12  6 - 23 mg/dL   Creatinine, Ser 7.84  0.50 - 1.35 mg/dL   Calcium 9.0  8.4 - 69.6 mg/dL   GFR calc non Af Amer >90  >90 mL/min   GFR calc Af Amer >90   >90 mL/min  PRO B NATRIURETIC PEPTIDE      Result Value Range   Pro B Natriuretic peptide (BNP) 81.6  0 - 125 pg/mL  POCT I-STAT TROPONIN I      Result Value Range   Troponin i, poc 0.18 (*) 0.00 - 0.08 ng/mL   Comment NOTIFIED PHYSICIAN     Comment 3            Dg Chest 2 View  12/21/2012  *RADIOLOGY REPORT*  Clinical Data: Mid chest pain for 3 days.  CHEST - 2 VIEW  Comparison: None.  Findings: Slightly shallow inspiration with elevation of the left hemidiaphragm.  Heart size and pulmonary vascularity are normal for technique.  No focal consolidation or airspace disease in the lungs.  No blunting of costophrenic angles.  No pneumothorax. Mediastinal contours appear intact.  IMPRESSION: No evidence of active pulmonary disease.   Original Report Authenticated By: Burman Nieves, M.D.      Date: 12/21/2012  Rate: 58  Rhythm: sinus bradycardia  QRS Axis: normal  Intervals: normal  ST/T Wave abnormalities: nonspecific ST/T changes  Conduction Disutrbances:none  Narrative Interpretation: sinus with STE V2 and T wave inversions III, aVF. No ST depressions  Old EKG Reviewed: no previous ECG   CRITICAL CARE Performed by: Sunnie Nielsen   Total critical care time: 30  Critical care time was exclusive of separately billable procedures and treating other patients.  Critical care was necessary to treat or prevent imminent or life-threatening deterioration.  Critical care was time spent personally by me on the following activities: development of treatment plan with patient and/or surrogate as well as nursing, discussions with consultants, evaluation of patient's response to treatment, examination of patient, obtaining history from patient or surrogate, ordering and performing treatments and interventions, ordering and review of laboratory studies, ordering and review of radiographic studies, pulse oximetry and re-evaluation of patient's condition. ASA. Heparin IV bolus/ drip. Labs and CXR and  ECG. Serial evaluations.   1:31 AM d/w Cardiologist on call, will see in ED. PT remains pain free.  Troponin I sent.   PLAN admit NSTEMI  MDM  CP/ SOB in PT with HTN who smokes tobacco  Abnormal ECG, elevated troponin  Pain free in the ED. ASA, heparin, CAR admit.         Sunnie Nielsen, MD 12/22/12 [phone]

## 2012-12-21 NOTE — Care Management Note (Unsigned)
    Page 1 of 1   12/21/2012     3:57:10 PM   CARE MANAGEMENT NOTE 12/21/2012  Patient:  Bradley Wolfe, Bradley Wolfe   Account Number:  [id number]  Date Initiated:  12/21/2012  Documentation initiated by:  Estes Lehner  Subjective/Objective Assessment:   PT ADM WITH NSTEMI FOR CATH TODAY.  PTA, PT RESIDES AT HOME WITH WIFE, AND IS INDEPENDENT.     Action/Plan:   PT HAS NO INSURANCE AND STATES MAY NEED MED ASSISTANCE AT DC.  HE IS ELIGIBLE FOR MATCH PROGRAM.  WILL FOLLOW FOR HOME NEEDS AS PT PROGRESSES.   Anticipated DC Date:  12/23/2012   Anticipated DC Plan:  HOME/SELF CARE      DC Planning Services  CM consult      Choice offered to / List presented to:             Status of service:  In process, will continue to follow Medicare Important Message given?   (If response is "NO", the following Medicare IM given date fields will be blank) Date Medicare IM given:   Date Additional Medicare IM given:    Discharge Disposition:    Per UR Regulation:    If discussed at Long Length of Stay Meetings, dates discussed:    Comments:

## 2012-12-21 NOTE — Interval H&P Note (Signed)
History and Physical Interval Note:  12/21/2012 1:17 PM  Bradley Wolfe.  has presented today for cardiac cath with the diagnosis of cp/nstemi. The various methods of treatment have been discussed with the patient and family. After consideration of risks, benefits and other options for treatment, the patient has consented to  Procedure(s): LEFT HEART CATHETERIZATION WITH CORONARY ANGIOGRAM (N/A) as a surgical intervention .  The patient's history has been reviewed, patient examined, no change in status, stable for surgery.  I have reviewed the patient's chart and labs.  Questions were answered to the patient's satisfaction.     MCALHANY,CHRISTOPHER

## 2012-12-21 NOTE — Progress Notes (Signed)
ANTICOAGULATION CONSULT NOTE - Initial Consult  Pharmacy Consult for Heparin Indication: chest pain/ACS  No Known Allergies  Patient Measurements: Height: 5\' 8"  (172.7 cm) Weight: 194 lb 10.7 oz (88.3 kg) IBW/kg (Calculated) : 68.4 Heparin Dosing Weight: 85 kg   Vital Signs: Temp: 98.2 F (36.8 C) (02/27 0422) Temp src: Oral (02/27 0422) BP: 180/101 mmHg (02/27 0422) Pulse Rate: 58 (02/27 0422)  Labs:  Recent Labs  12/20/12 2348 12/21/12 0131 12/21/12 0530 12/21/12 0845  HGB 14.2  --  13.0  --   HCT 39.1  --  36.2*  --   PLT 223  --  192  --   LABPROT  --   --   --  12.4  INR  --   --   --  0.93  HEPARINUNFRC  --   --   --  0.32  CREATININE 0.99  --  0.85  --   TROPONINI  --  0.52* 0.37*  --     Estimated Creatinine Clearance: 122.3 ml/min (by C-G formula based on Cr of 0.85).   Medical History: Past Medical History  Diagnosis Date  . Hypertension     Medications:  Prescriptions prior to admission  Medication Sig Dispense Refill  . amLODipine (NORVASC) 5 MG tablet Take 5 mg by mouth daily.      [name] omeprazole (PRILOSEC OTC) 20 MG tablet Take 20 mg by mouth daily.        Assessment: 42 yo male with chest pain for heparin. Heparin level therapeutic on 1000 units/hr, Hgb/Plt stable. Plan for Cath today.  Goal of Therapy:  Heparin level 0.3-0.7 units/ml Monitor platelets by anticoagulation protocol: Yes   Plan:  Continue Heparin at current rate  We will f/u plans after cath   Bayard Hugger, PharmD, BCPS  Clinical Pharmacist  Pager: [phone]

## 2012-12-22 ENCOUNTER — Telehealth: Payer: Self-pay | Admitting: Physician Assistant

## 2012-12-22 ENCOUNTER — Encounter (HOSPITAL_COMMUNITY): Payer: Self-pay | Admitting: Physician Assistant

## 2012-12-22 ENCOUNTER — Other Ambulatory Visit: Payer: Self-pay | Admitting: Physician Assistant

## 2012-12-22 DIAGNOSIS — E785 Hyperlipidemia, unspecified: Secondary | ICD-10-CM

## 2012-12-22 DIAGNOSIS — F17200 Nicotine dependence, unspecified, uncomplicated: Secondary | ICD-10-CM

## 2012-12-22 DIAGNOSIS — I10 Essential (primary) hypertension: Secondary | ICD-10-CM

## 2012-12-22 DIAGNOSIS — I214 Non-ST elevation (NSTEMI) myocardial infarction: Secondary | ICD-10-CM

## 2012-12-22 DIAGNOSIS — Z5181 Encounter for therapeutic drug level monitoring: Secondary | ICD-10-CM

## 2012-12-22 DIAGNOSIS — I2510 Atherosclerotic heart disease of native coronary artery without angina pectoris: Secondary | ICD-10-CM

## 2012-12-22 LAB — DRUGS OF ABUSE SCREEN W/O ALC, ROUTINE URINE
Amphetamine Screen, Ur: NEGATIVE
Barbiturate Quant, Ur: NEGATIVE
Benzodiazepines.: NEGATIVE
Marijuana Metabolite: POSITIVE — AB
Methadone: NEGATIVE
Phencyclidine (PCP): NEGATIVE

## 2012-12-22 LAB — BASIC METABOLIC PANEL
CO2: 24 mEq/L (ref 19–32)
Chloride: 105 mEq/L (ref 96–112)
Creatinine, Ser: 0.93 mg/dL (ref 0.50–1.35)
Glucose, Bld: 94 mg/dL (ref 70–99)

## 2012-12-22 LAB — CBC
HCT: 39.7 % (ref 39.0–52.0)
Hemoglobin: 14.7 g/dL (ref 13.0–17.0)
MCV: 83.4 fL (ref 78.0–100.0)
Platelets: 221 10*3/uL (ref 150–400)
RBC: 4.76 MIL/uL (ref 4.22–5.81)
WBC: 6.7 10*3/uL (ref 4.0–10.5)

## 2012-12-22 MED ORDER — AMLODIPINE BESYLATE 5 MG PO TABS
5.0000 mg | ORAL_TABLET | Freq: Every day | ORAL | Status: DC
Start: 2012-12-22 — End: 2012-12-22
  Administered 2012-12-22: 09:00:00 5 mg via ORAL
  Filled 2012-12-22: qty 1

## 2012-12-22 MED ORDER — INFLUENZA VIRUS VACC SPLIT PF IM SUSP
0.5000 mL | INTRAMUSCULAR | Status: DC | PRN
  Filled 2012-12-22: qty 0.5

## 2012-12-22 MED ORDER — ASPIRIN 81 MG PO TBEC: 81.0000 mg | DELAYED_RELEASE_TABLET | Freq: Every day | ORAL | Status: DC

## 2012-12-22 MED ORDER — METOPROLOL TARTRATE 25 MG PO TABS: 25.0000 mg | ORAL_TABLET | Freq: Two times a day (BID) | ORAL | Status: DC

## 2012-12-22 MED ORDER — LISINOPRIL 20 MG PO TABS
20.0000 mg | ORAL_TABLET | Freq: Every day | ORAL | Status: DC
  Administered 2012-12-22: 20 mg via ORAL
  Filled 2012-12-22 (×2): qty 1

## 2012-12-22 MED ORDER — LISINOPRIL 20 MG PO TABS: 20.0000 mg | ORAL_TABLET | Freq: Every day | ORAL | Status: DC

## 2012-12-22 MED ORDER — NITROGLYCERIN 0.4 MG SL SUBL: 0.4000 mg | SUBLINGUAL_TABLET | SUBLINGUAL | Status: DC | PRN

## 2012-12-22 MED ORDER — PRASUGREL HCL 10 MG PO TABS: 10.0000 mg | ORAL_TABLET | Freq: Every day | ORAL | Status: DC

## 2012-12-22 MED ORDER — ATORVASTATIN CALCIUM 80 MG PO TABS: 80.0000 mg | ORAL_TABLET | Freq: Every day | ORAL | Status: DC

## 2012-12-22 MED FILL — Dextrose Inj 5%: INTRAVENOUS | Qty: 50 | Status: AC

## 2012-12-22 NOTE — Progress Notes (Signed)
Discharge instructions given and reviewed with patient and family. Patient discharged home with family member.

## 2012-12-22 NOTE — Progress Notes (Signed)
12/22/12 1110 In to speak with pt. About Effient medication.  Pt. Has Effient 30day free card, as well as the co-pay card.  Pt. Denies any concerns.  Pt. To dc home today. Tera Mater, RN, BSN NCM [phone]

## 2012-12-22 NOTE — Telephone Encounter (Signed)
New Problem:    I scheduled patient to have a TOC appointment with Tereso Newcomer on 12/27/12 @ 3:20pm.

## 2012-12-22 NOTE — Progress Notes (Signed)
BP 173/96 per NT.  Pt dozing quietly, awakened easily, denies complaints. BP rechecked  168/94.  10 mg IV hydralazine given.

## 2012-12-22 NOTE — Progress Notes (Signed)
    SUBJECTIVE: No chest pain this am. No events overnight.   BP 168/94  Pulse 56  Temp(Src) 98.2 F (36.8 C) (Oral)  Resp 16  Ht 5\' 8"  (1.727 m)  Wt 204 lb 9.4 oz (92.8 kg)  BMI 31.11 kg/m2  SpO2 100%  Intake/Output Summary (Last 24 hours) at 12/22/12 1610 Last data filed at 12/22/12 0700  Gross per 24 hour  Intake 694.75 ml  Output   1100 ml  Net -405.25 ml    PHYSICAL EXAM General: Well developed, well nourished, in no acute distress. Alert and oriented x 3.  Psych:  Good affect, responds appropriately Neck: No JVD. No masses noted.  Lungs: Clear bilaterally with no wheezes or rhonci noted.  Heart: RRR with no murmurs noted. Abdomen: Bowel sounds are present. Soft, non-tender.  Extremities: No lower extremity edema. Right wrist cath site ok.   LABS: Basic Metabolic Panel:  Recent Labs  96/04/54 2348 12/21/12 0530  NA 137 138  K 3.3* 3.9  CL 100 103  CO2 27 28  GLUCOSE 82 97  BUN 12 11  CREATININE 0.99 0.85  CALCIUM 9.0 9.0   CBC:  Recent Labs  12/21/12 0530 12/22/12 0720  WBC 5.5 6.7  HGB 13.0 14.7  HCT 36.2* 39.7  MCV 84.4 83.4  PLT 192 221   Cardiac Enzymes:  Recent Labs  12/21/12 0131 12/21/12 0530  TROPONINI 0.52* 0.37*   Fasting Lipid Panel:  Recent Labs  12/21/12 0530  CHOL 153  HDL 51  LDLCALC 88  TRIG 71  CHOLHDL 3.0    Current Meds: . aspirin EC  81 mg Oral Daily  . atorvastatin  80 mg Oral q1800  . lisinopril  20 mg Oral Daily  . metoprolol tartrate  25 mg Oral BID  . pantoprazole  40 mg Oral Daily  . prasugrel  10 mg Oral Daily     ASSESSMENT AND PLAN:  1. NSTEMI: Pt admitted with NSTEMI. S/p cath yesterday with total occlusion of RCA and severe stenosis mid LAD. Now s/p DES in the LAD and DES in the RCA. Doing well. He will need ASA and Effient for one year. We will ask Case management to assist with paperwork for the Effient Assistance Program. Will continue beta blocker, Ace-inhibitor and statin.   2.  Tobacco abuse:  Cessation recommended.   3. HTN: BP remains elevated overnight. Lisinopril dose increased to 20 mg daily after cath (he only received 10 mg of Lisinopril yesterday). Will not increase beta blocker with bradycardia. Will resume his home dose of Norvasc 5 mg po Qdaily.   4. Dispo: D/C home today if BMET ok. He can f/u with Dr. Jens Som. Would arrange f/u with Tereso Newcomer, PA-C in 1-2 weeks.   MCALHANY,CHRISTOPHER  2/28/20147:26 AM

## 2012-12-22 NOTE — Discharge Summary (Signed)
Discharge Summary   Patient ID: Bradley Iglesia.,  MRN: [id number], DOB/AGE: 11/03/1970 42 y.o.  Admit date: 12/21/2012 Discharge date: 12/22/2012  Primary Physician: Rama (Georgianne Fick) Sofie Hartigan, MD Primary Cardiologist: B. Jens Som, MD  Discharge Diagnoses Principal Problem:   NSTEMI (non-ST elevated myocardial infarction)  - s/p cardiac cath + PCI 12/21/12: 90% mid LAD s/p DES, 20% OM1, 100% mid RCA s/p DES; LVEF 55-60%  - DAPT- ASA/Effient x 12 months Active Problems:   Hypertension  - ACEi and BB added   - BMET on follow-up  - To resume outpatient Norvasc   Tobacco abuse  - Prefers to quit without assistance for now   GERD (gastroesophageal reflux disease)  - Continue PPI   CAD (coronary artery disease), native coronary artery  - ASA/Effient/ACEi/BB/statin/NTG SL PRN   Hyperlipidemia  - Statin added as above  - Check lipid panel/LFTs in 6 weeks  Allergies No Known Allergies  Diagnostic Studies/Procedures  PA/LATERAL CHEST X-RAY - 12/21/12  IMPRESSION:  No evidence of active pulmonary disease.  CARDIAC CATHETERIZATION + PERCUTANEOUS CORONARY INTERVENTION - 12/21/12  Hemodynamic Findings:  Central aortic pressure: 150/87  Left ventricular pressure: 148/9/17  Angiographic Findings:  Left main: No obstructive disease noted.  Left Anterior Descending Artery: Large caliber vessel that courses to the apex. The mid vessel has a 90% stenosis just after the takeoff of a moderate sized diagonal branch. No other obstructive disease noted in the LAD or Diagonal branches.  Circumflex Artery: Moderate caliber vessel with two moderate caliber marginal branches. The first OM branch has 20% stenosis.  Right Coronary Artery: Large, dominant vessel with 100% mid occlusion.  Left Ventricular Angiogram: LVEF=55-60%  Impression:  1. Severe double vessel CAD  2. Total occlusion of the mid RCA, now s/p successful PCI with DES x 1 mid RCA  3. Severe stenosis mid LAD, now s/p  successful PCI with DES x 1 mid LAD  4. Normal LV systolic function  History of Present Illness  Bradley Rueter. is a 42 y.o. male who was admitted to [facility] on 12/20/12 with the above problem list.  He has no prior history of coronary artery disease. He has a history of hypertension tobacco abuse. Has history of psoriasis and is currently enrolled in a study. Father passed of an MI 23s. Mother had multiple MIs. He reported experiencing intermittent chest pain over the past month described as pressure with some associated shortness of breath. There was an occasional exertional component to his discomfort. He also noted the pressure was worse at night upon laying down, and he attributed this to reflux symptoms, however antacids did not improve the pain. The pain worsened thus prompting his ED presentation.  There, EKG revealed sinus bradycardia with inferior T wave inversions in borderline LVH. Point-of-care troponin I returned mildly elevated. Heparin was initiated Chest x-ray as above revealed no acute cardiopulmonary abnormality. These findings, the plan was made to pursue cardiac catheterization.   Hospital Course   He remained stable upon admission. He was noted to be hypertensive. Outpatient Norvasc was continued and a beta blocker was added. Urine drug screen did return positive for THC. TSH returned within normal limits. Hemoglobin A1c returned at 5.6%. He was evaluated by Dr. Jens Som the following morning and the decision was made to proceed with cath. He was informed, consented and prepped for the procedure which was accessed via the right radial artery. As above, this revealed a 90% mid LAD and 100% mid RCA  occlusion s/p DES x 2. LVEF returned at 55-60%. The recommendation was made to pursue dual antiplatelet therapy-aspirin/Effient x 12 months. He tolerated the procedure well without complications. The patient was noted to be hypertensive and an ACE inhibitor was started.  He was also started on high-dose atorvastatin.   He was observed overnight, ambulated without incident with cardiac rehab and was evaluated by Dr. Clifton James this morning and deemed stable for discharge. He will followup within 7 days given post-NSTEMI status. Basic metabolic panel will be drawn at this time given the initiation of an ACE inhibitor. He will be discharged on the medication regimen listed below. Case management has arranged Effient assistance. He has been advised to contact the coordinator of the psoriasis study for which he is currently enrolled to update him on this hospitalization and new medications added. This information, including post cath instructions and activity restrictions, has been clearly outlined in the discharge AVS.   Discharge Vitals:  Blood pressure 168/94, pulse 56, temperature 98.2 F (36.8 C), temperature source Oral, resp. rate 16, height 5\' 8"  (1.727 m), weight 92.8 kg (204 lb 9.4 oz), SpO2 100.00%.   Labs: Recent Labs     12/21/12  0530  12/22/12  0720  WBC  5.5  6.7  HGB  13.0  14.7  HCT  36.2*  39.7  MCV  84.4  83.4  PLT  192  221   Recent Labs Lab 12/20/12 2348 12/21/12 0530 12/22/12 0720  NA 137 138 140  K 3.3* 3.9 3.9  CL 100 103 105  CO2 27 28 24   BUN 12 11 8   CREATININE 0.99 0.85 0.93  CALCIUM 9.0 9.0 9.6  PROT  --  6.5  --   BILITOT  --  0.3  --   ALKPHOS  --  81  --   ALT  --  28  --   AST  --  21  --   GLUCOSE 82 97 94   Recent Labs     12/21/12  0530  HGBA1C  5.6   Recent Labs     12/21/12  0131  12/21/12  0530  TROPONINI  0.52*  0.37*   Recent Labs     12/21/12  0530  CHOL  153  HDL  51  LDLCALC  88  TRIG  71  CHOLHDL  3.0    Recent Labs  12/21/12 0530  TSH 1.405    Disposition:   Future Appointments Provider Department Dept Phone   12/27/2012 3:20 PM Beatrice Lecher, PA [facility] Main Office [place]) [phone]   12/27/2012 3:45 PM [facility] [employer] [employer] Main Office [place])  [phone]         Follow-up Information   Follow up with Tereso Newcomer, PA On 12/27/2012. (At 3:20 PM for follow-up and labwork. )    Contact information:   [address] [place] [address] [phone]       Discharge Medications:    Medication List    TAKE these medications       amLODipine 5 MG tablet  Commonly known as:  NORVASC  Take 5 mg by mouth daily.     aspirin 81 MG EC tablet  Take 1 tablet (81 mg total) by mouth daily.     atorvastatin 80 MG tablet  Commonly known as:  LIPITOR  Take 1 tablet (80 mg total) by mouth daily at 6 PM.     lisinopril 20 MG tablet  Commonly known  as:  PRINIVIL,ZESTRIL  Take 1 tablet (20 mg total) by mouth daily.     metoprolol tartrate 25 MG tablet  Commonly known as:  LOPRESSOR  Take 1 tablet (25 mg total) by mouth 2 (two) times daily.     nitroGLYCERIN 0.4 MG SL tablet  Commonly known as:  NITROSTAT  Place 1 tablet (0.4 mg total) under the tongue every 5 (five) minutes x 3 doses as needed for chest pain.     omeprazole 20 MG tablet  Commonly known as:  PRILOSEC OTC  Take 20 mg by mouth daily.     prasugrel 10 MG Tabs  Commonly known as:  EFFIENT  Take 1 tablet (10 mg total) by mouth daily.       Outstanding Labs/Studies: BMET on 12/27/12  Duration of Discharge Encounter: Greater than 30 minutes including physician time.  Signed, R. Hurman Horn, PA-C 12/22/2012, 8:50 AM

## 2012-12-22 NOTE — Discharge Summary (Signed)
See full note this am. cdm 

## 2012-12-22 NOTE — Progress Notes (Signed)
12/22/12 1430 Pt. wanted the [facility] program for all medications.  This NCM gave pt. MATCH letter to use at any of the pharmacies listed on the [facility] letter.  Mother of pt. stated they would be using [facility] at [address] in [place].  TC to [place], [place], to ask for paper prescriptions for pt. to use with [facility] letter.  Alinda Money, PA to come change prescriptions for pt. Tera Mater, RN, BSN NCM [phone]

## 2012-12-22 NOTE — Progress Notes (Signed)
CARDIAC REHAB PHASE I   PRE:  Rate/Rhythm: 83 SR  BP:  Supine: 167/87  Sitting:   Standing:    SaO2:   MODE:  Ambulation: 1000 ft   POST:  Rate/Rhythem: 78 SR  BP:  Supine:   Sitting: 171/85  Standing:    SaO2:  0740-0850 Pt tolerated ambulation well without c/o of cp or SOB. BP up before and after walk. Completed MI and stent education with pt. He voices understanding. Discussed smoking cessation with pt and gave him tips for quitting and coaching contact number. Pt states that he smokes very little and feels that he will be able to stop. He agrees to [employer]. [facility] in [place], will send referral.  Bradley Wolfe

## 2012-12-25 LAB — THC (MARIJUANA), URINE, CONFIRMATION: Marijuana, Ur-Confirmation: >1000 ng/mL — ABNORMAL HIGH

## 2012-12-25 NOTE — Telephone Encounter (Signed)
Called patient at home to check status (TCM) States he is feeling well without any problems. Taking all meds that were ordered. He is aware of lab and PA appointment on 3/5.

## 2012-12-27 ENCOUNTER — Encounter: Payer: Self-pay | Admitting: Physician Assistant

## 2012-12-27 ENCOUNTER — Other Ambulatory Visit (INDEPENDENT_AMBULATORY_CARE_PROVIDER_SITE_OTHER): Payer: Self-pay

## 2012-12-27 ENCOUNTER — Ambulatory Visit (INDEPENDENT_AMBULATORY_CARE_PROVIDER_SITE_OTHER): Payer: Self-pay | Admitting: Physician Assistant

## 2012-12-27 VITALS — BP 128/72 | HR 48 | Ht 68.0 in | Wt 200.0 lb

## 2012-12-27 DIAGNOSIS — E785 Hyperlipidemia, unspecified: Secondary | ICD-10-CM

## 2012-12-27 DIAGNOSIS — I2510 Atherosclerotic heart disease of native coronary artery without angina pectoris: Secondary | ICD-10-CM

## 2012-12-27 DIAGNOSIS — R0989 Other specified symptoms and signs involving the circulatory and respiratory systems: Secondary | ICD-10-CM

## 2012-12-27 DIAGNOSIS — I214 Non-ST elevation (NSTEMI) myocardial infarction: Secondary | ICD-10-CM

## 2012-12-27 DIAGNOSIS — I10 Essential (primary) hypertension: Secondary | ICD-10-CM

## 2012-12-27 NOTE — Progress Notes (Addendum)
[address] [place], [place]  [address] Phone: [phone], Fax:  [phone]  Date:  12/27/2012   ID:  Bradley Maffucci., DOB [birth year], MRN [id number]  PCP:  Rama Loreen Freud, MD  Primary Cardiologist:  Dr. Olga Millers     History of Present Illness: Bradley Hanners. is a 42 y.o. male who returns for follow up after a recent admission to the hospital for a NSTEMI.  He has a hx of HTN, tobacco abuse, FHx CAD and psoriasis.  Admitted 2/27-2/28 after presenting with worsening chest pain.  LHC 12/21/12: mLAD 90%, OM1 20%, mRCA 100%, EF 55-60%.  PCI: Xience Xpedition DES to mRCA and Xience Xpedition DES to mLAD.  DAPT recommend x 1 year.   Since d/c he has done well.  Has an occasional "twinge" in his chest but no symptoms like prior angina.  No dyspnea.  No syncope.  No orthopnea, PND, edema.    Labs (2/14):  K 3.9, creatinine 0.93, ALT 28, LDL 88, Hgb 14.7, TSH 1.405  Wt Readings from Last 3 Encounters:  12/27/12 200 lb (90.719 kg)  12/22/12 204 lb 9.4 oz (92.8 kg)  12/22/12 204 lb 9.4 oz (92.8 kg)     Past Medical History  Diagnosis Date  . Hypertension   . CAD (coronary artery disease), native coronary artery 12/21/12    a. NSTEMI 2/14 => LHC 12/21/12: mLAD 90%, OM1 20%, mRCA 100%, EF 55-60%.  PCI: Xience Xpedition DES to mRCA and Xience Xpedition DES to mLAD.   [name] Tobacco abuse   . GERD (gastroesophageal reflux disease)   . Hyperlipidemia   . Psoriasis     Current Outpatient Prescriptions  Medication Sig Dispense Refill  . amLODipine (NORVASC) 5 MG tablet Take 5 mg by mouth daily.      [name] aspirin EC 81 MG EC tablet Take 1 tablet (81 mg total) by mouth daily.      [name] atorvastatin (LIPITOR) 80 MG tablet Take 1 tablet (80 mg total) by mouth daily at 6 PM.  30 tablet  3  . lisinopril (PRINIVIL,ZESTRIL) 20 MG tablet Take 1 tablet (20 mg total) by mouth daily.  30 tablet  3  . metoprolol tartrate (LOPRESSOR) 25 MG tablet Take 1  tablet (25 mg total) by mouth 2 (two) times daily.  60 tablet  3  . nitroGLYCERIN (NITROSTAT) 0.4 MG SL tablet Place 1 tablet (0.4 mg total) under the tongue every 5 (five) minutes x 3 doses as needed for chest pain.  25 tablet  3  . omeprazole (PRILOSEC OTC) 20 MG tablet Take 20 mg by mouth daily.      . prasugrel (EFFIENT) 10 MG TABS Take 1 tablet (10 mg total) by mouth daily.  30 tablet  3   No current facility-administered medications for this visit.    Allergies:   No Known Allergies  Social History:  The patient  reports that he has quit smoking. His smoking use included Cigarettes. He has a 5 pack-year smoking history. He does not have any smokeless tobacco history on file. He reports that he drinks about 0.5 ounces of alcohol per week. He reports that he uses illicit drugs (Marijuana).   ROS:  Please see the history of present illness.    All other systems reviewed and negative.   PHYSICAL EXAM: VS:  BP 128/72  Pulse 48  Ht 5\' 8"  (1.727 m)  Wt 200 lb (90.719 kg)  BMI 30.42 kg/m2  Well nourished, well developed, in no acute distress HEENT: normal Neck: no JVD Cardiac:  normal S1, S2; RRR; no murmur Lungs:  clear to auscultation bilaterally, no wheezing, rhonchi or rales Abd: soft, nontender, no hepatomegaly Ext: no edema; right wrist without hematoma or bruit  Skin: warm and dry Neuro:  CNs 2-12 intact, no focal abnormalities noted  EKG:  Sinus brady, HR 48, normal axis, TWI in 3, aVF, no change from prior tracings     ASSESSMENT AND PLAN:  1. Coronary Artery Disease:  Doing well s/p recent NSTEMI treated with DES to the RCA and LAD.  We discussed the importance of dual antiplatelet therapy. Will refer to cardiac rehab.   2. Hypertension:  Controlled.  Continue current therapy.  Check bmet today. 3. Hyperlipidemia:  Check Lipids and LFTs in 6 weeks.   4. Tobacco Abuse:  I congratulated him on quitting smoking. 5. Psoriasis:  Follow up with Dr. Gae Dry in [facility]. 6. Sinus Bradycardia:  Asymptomatic.  Continue current Rx.  7. Disposition:  Follow up with Dr. Olga Millers in 6 weeks.  Luna Glasgow, PA-C  4:00 PM 12/27/2012

## 2012-12-27 NOTE — Patient Instructions (Addendum)
LAB TODAY; BMET  FASTING LIPID AND LIVER PANEL TO BE DONE IN 6 WEEKS  Your physician recommends that you schedule a follow-up appointment in: 6 WEEKS WITH DR. CRENSHAW  You have been referred to CARDIAC REHAB TO BE DONE AT [facility]; DX 272.4, 414.01, 401.1

## 2012-12-28 LAB — BASIC METABOLIC PANEL
CO2: 29 mEq/L (ref 19–32)
Chloride: 102 mEq/L (ref 96–112)
Creatinine, Ser: 1 mg/dL (ref 0.4–1.5)

## 2013-01-08 ENCOUNTER — Encounter: Payer: Commercial Managed Care - PPO | Admitting: Physician Assistant

## 2013-01-18 ENCOUNTER — Other Ambulatory Visit: Payer: Self-pay | Admitting: Physician Assistant

## 2013-01-19 ENCOUNTER — Telehealth: Payer: Self-pay | Admitting: Cardiology

## 2013-01-19 NOTE — Telephone Encounter (Signed)
New Prob   Requesting some information in regards to resources/locations where pt can receive assistance getting his medications. Would like to speak to nurse.

## 2013-01-19 NOTE — Telephone Encounter (Signed)
Spoke with patient's daughter who states her father does not have insurance and will not be able to afford to take a lot of expensive medications.  Patient received coupon from hospital for Effient.  Daughter has taken prescriptions to [facility] and expects to receive a call today regarding cost of each medication.  Advised daughter to talk with pharmacist regarding generics and/or other doses that patient could substitute that would be cheaper.  Advised daughter to call us back if she had questions/concerns and we could evaluate possible alternatives.

## 2013-01-22 ENCOUNTER — Other Ambulatory Visit: Payer: Self-pay | Admitting: *Deleted

## 2013-01-22 MED ORDER — ATORVASTATIN CALCIUM 80 MG PO TABS: ORAL_TABLET | ORAL | Status: DC

## 2013-01-22 MED ORDER — PRASUGREL HCL 10 MG PO TABS: ORAL_TABLET | ORAL | Status: DC

## 2013-01-22 MED ORDER — METOPROLOL TARTRATE 25 MG PO TABS: ORAL_TABLET | ORAL | Status: DC

## 2013-01-22 MED ORDER — NITROGLYCERIN 0.4 MG SL SUBL: 0.4000 mg | SUBLINGUAL_TABLET | SUBLINGUAL | Status: DC | PRN

## 2013-01-22 MED ORDER — LISINOPRIL 20 MG PO TABS: ORAL_TABLET | ORAL | Status: DC

## 2013-01-22 MED ORDER — AMLODIPINE BESYLATE 5 MG PO TABS: 5.0000 mg | ORAL_TABLET | Freq: Every day | ORAL | Status: DC

## 2013-02-09 ENCOUNTER — Other Ambulatory Visit (INDEPENDENT_AMBULATORY_CARE_PROVIDER_SITE_OTHER): Payer: Self-pay

## 2013-02-09 DIAGNOSIS — E785 Hyperlipidemia, unspecified: Secondary | ICD-10-CM

## 2013-02-09 LAB — LIPID PANEL
Cholesterol: 99 mg/dL (ref 0–200)
HDL: 47.5 mg/dL (ref >39.00)
LDL Cholesterol: 41 mg/dL (ref 0–99)
Triglycerides: 53 mg/dL (ref 0.0–149.0)

## 2013-02-09 LAB — HEPATIC FUNCTION PANEL
ALT: 42 U/L (ref 0–53)
Total Bilirubin: 0.6 mg/dL (ref 0.3–1.2)
Total Protein: 7 g/dL (ref 6.0–8.3)

## 2013-02-14 ENCOUNTER — Encounter: Payer: Self-pay | Admitting: Cardiology

## 2013-02-14 ENCOUNTER — Encounter: Payer: Self-pay | Admitting: *Deleted

## 2013-02-15 ENCOUNTER — Encounter: Payer: Self-pay | Admitting: Cardiology

## 2013-02-15 ENCOUNTER — Ambulatory Visit (INDEPENDENT_AMBULATORY_CARE_PROVIDER_SITE_OTHER): Payer: Self-pay | Admitting: Cardiology

## 2013-02-15 VITALS — BP 135/81 | HR 46 | Wt 205.0 lb

## 2013-02-15 DIAGNOSIS — E785 Hyperlipidemia, unspecified: Secondary | ICD-10-CM

## 2013-02-15 DIAGNOSIS — I10 Essential (primary) hypertension: Secondary | ICD-10-CM

## 2013-02-15 DIAGNOSIS — Z720 Tobacco use: Secondary | ICD-10-CM

## 2013-02-15 DIAGNOSIS — F17200 Nicotine dependence, unspecified, uncomplicated: Secondary | ICD-10-CM

## 2013-02-15 DIAGNOSIS — I2510 Atherosclerotic heart disease of native coronary artery without angina pectoris: Secondary | ICD-10-CM

## 2013-02-15 NOTE — Progress Notes (Signed)
   HPI: Pleasant male who returns for follow up of CAD. Admitted 2/14 with NSTEMI. LHC 12/21/12: mLAD 90%, OM1 20%, mRCA 100%, EF 55-60%. PCI: Xience Xpedition DES to mRCA and Xience Xpedition DES to mLAD. DAPT recommend x 1 year. Since he was last seen, the patient denies any dyspnea on exertion, orthopnea, PND, pedal edema, palpitations, syncope or chest pain.    Current Outpatient Prescriptions  Medication Sig Dispense Refill  . amLODipine (NORVASC) 5 MG tablet Take 1 tablet (5 mg total) by mouth daily.  30 tablet  5  . aspirin EC 81 MG EC tablet Take 1 tablet (81 mg total) by mouth daily.      [name] atorvastatin (LIPITOR) 80 MG tablet TAKE ONE TABLET BY MOUTH ONCE DAILY  30 tablet  3  . lisinopril (PRINIVIL,ZESTRIL) 20 MG tablet TAKE ONE TABLET BY MOUTH ONCE DAILY  30 tablet  3  . metoprolol tartrate (LOPRESSOR) 25 MG tablet TAKE ONE TABLET BY MOUTH TWICE DAILY  60 tablet  3  . nitroGLYCERIN (NITROSTAT) 0.4 MG SL tablet Place 1 tablet (0.4 mg total) under the tongue every 5 (five) minutes x 3 doses as needed for chest pain.  25 tablet  3  . omeprazole (PRILOSEC OTC) 20 MG tablet Take 20 mg by mouth as needed.       . prasugrel (EFFIENT) 10 MG TABS TAKE ONE TABLET BY MOUTH ONCE DAILY  30 tablet  3   No current facility-administered medications for this visit.     Past Medical History  Diagnosis Date  . Hypertension   . CAD (coronary artery disease), native coronary artery 12/21/12    a. NSTEMI 2/14 => LHC 12/21/12: mLAD 90%, OM1 20%, mRCA 100%, EF 55-60%.  PCI: Xience Xpedition DES to mRCA and Xience Xpedition DES to mLAD.   [name] Tobacco abuse   . GERD (gastroesophageal reflux disease)   . Hyperlipidemia   . Psoriasis     Past Surgical History  Procedure Laterality Date  . Coronary angioplasty with stent placement  12/21/12    90% mid LAD s/p DES, 20% OM1, 100% mid RCA s/p DES; LVEF 55-60%    History   Social History  . Marital Status: Single    Spouse Name: N/A    Number of  Children: N/A  . Years of Education: N/A   Occupational History  . Not on file.   Social History Main Topics  . Smoking status: Former Smoker -- 0.25 packs/day for 20 years    Types: Cigarettes  . Smokeless tobacco: Not on file  . Alcohol Use: 0.5 oz/week    1 drink(s) per week  . Drug Use: Yes    Special: Marijuana  . Sexually Active: Not on file   Other Topics Concern  . Not on file   Social History Narrative  . No narrative on file    ROS: some pain in left wrist but no fevers or chills, productive cough, hemoptysis, dysphasia, odynophagia, melena, hematochezia, dysuria, hematuria, rash, seizure activity, orthopnea, PND, pedal edema, claudication. Remaining systems are negative.  Physical Exam: Well-developed well-nourished in no acute distress.  Skin is warm and dry.  HEENT is normal.  Neck is supple.  Chest is clear to auscultation with normal expansion.  Cardiovascular exam is regular rate and rhythm.  Abdominal exam nontender or distended. No masses palpated. Extremities show no edema. neuro grossly intact  ECG sinus bradycardia at a rate of 47. No ST changes.

## 2013-02-15 NOTE — Assessment & Plan Note (Signed)
Continue present blood pressure medications. 

## 2013-02-15 NOTE — Assessment & Plan Note (Signed)
Continue aspirin, effient and statin. Discussed lifestyle modification.

## 2013-02-15 NOTE — Assessment & Plan Note (Signed)
Continue statin. 

## 2013-02-15 NOTE — Assessment & Plan Note (Signed)
Patient counseled on discontinuing. 

## 2013-02-15 NOTE — Patient Instructions (Addendum)
Your physician wants you to follow-up in: 6 MONTHS WITH DR CRENSHAW You will receive a reminder letter in the mail two months in advance. If you don't receive a letter, please call our office to schedule the follow-up appointment.  

## 2013-03-12 ENCOUNTER — Telehealth: Payer: Self-pay | Admitting: Cardiology

## 2013-03-12 NOTE — Telephone Encounter (Signed)
New problem    pts wife wants to discuss a medication

## 2013-03-12 NOTE — Telephone Encounter (Signed)
Pt's wife called to see if we have samples for Effient 10 mg. Pt states a coupon was given for him to take to he pharmacy . Coupon did not work and pt needed to pay $300;00 dollars for this medication.  We do not have any samples of Effient 10 mg Pt's wife aware . Wife states pt will run out this medication Friday 03/16/13. She would like for debra to call her tomorrow to see what else pt can take so he can afford.

## 2013-03-12 NOTE — Telephone Encounter (Signed)
Pt wants to be called tomorrow regarding Effient at [phone].

## 2013-03-13 NOTE — Telephone Encounter (Signed)
Left message for pt to call.

## 2013-03-14 NOTE — Telephone Encounter (Signed)
Spoke with pt, aware I have samples of effient and will place at the front desk for pick up. Also I have the paperwork for [facility] for pt assistance. He will come by the office the end of this week so we can get the paperwork completed and sent into the company.

## 2013-04-10 ENCOUNTER — Telehealth: Payer: Self-pay | Admitting: *Deleted

## 2013-04-10 NOTE — Telephone Encounter (Signed)
Spoke with pt, aware effient placed at the front desk for pick up

## 2013-07-16 ENCOUNTER — Telehealth: Payer: Self-pay | Admitting: Cardiology

## 2013-07-16 NOTE — Telephone Encounter (Signed)
Spoke with pt, aware paperwork has been faxed to [facility] for refill request.

## 2013-07-16 NOTE — Telephone Encounter (Signed)
PT NEEDS REORDER EFFIENT THROUGH [facility]

## 2013-08-01 ENCOUNTER — Telehealth: Payer: Self-pay | Admitting: *Deleted

## 2013-08-01 NOTE — Telephone Encounter (Signed)
Left message for pt, effient placed at the front desk for pick up.

## 2013-08-04 ENCOUNTER — Other Ambulatory Visit: Payer: Self-pay | Admitting: Physician Assistant

## 2013-08-21 ENCOUNTER — Encounter: Payer: Self-pay | Admitting: Cardiology

## 2013-08-21 ENCOUNTER — Ambulatory Visit (INDEPENDENT_AMBULATORY_CARE_PROVIDER_SITE_OTHER): Payer: Self-pay | Admitting: Cardiology

## 2013-08-21 VITALS — BP 129/84 | HR 48 | Ht 68.0 in | Wt 213.4 lb

## 2013-08-21 DIAGNOSIS — I2510 Atherosclerotic heart disease of native coronary artery without angina pectoris: Secondary | ICD-10-CM

## 2013-08-21 DIAGNOSIS — Z720 Tobacco use: Secondary | ICD-10-CM

## 2013-08-21 DIAGNOSIS — I10 Essential (primary) hypertension: Secondary | ICD-10-CM

## 2013-08-21 DIAGNOSIS — F17200 Nicotine dependence, unspecified, uncomplicated: Secondary | ICD-10-CM

## 2013-08-21 NOTE — Patient Instructions (Signed)
Your physician wants you to follow-up in: 6 MONTHS WITH DR CRENSHAW You will receive a reminder letter in the mail two months in advance. If you don't receive a letter, please call our office to schedule the follow-up appointment.  

## 2013-08-21 NOTE — Assessment & Plan Note (Signed)
Continue aspirin, effient and statin. 

## 2013-08-21 NOTE — Assessment & Plan Note (Signed)
Continue statin. 

## 2013-08-21 NOTE — Assessment & Plan Note (Signed)
Patient counseled on discontinuing. 

## 2013-08-21 NOTE — Progress Notes (Signed)
      HPI: FU CAD. Admitted 2/14 with NSTEMI. LHC 12/21/12: mLAD 90%, OM1 20%, mRCA 100%, EF 55-60%. PCI: Xience Xpedition DES to mRCA and Xience Xpedition DES to mLAD. DAPT recommend x 1 year. Since he was last seen in April 2014, the patient denies any dyspnea on exertion, orthopnea, PND, pedal edema, palpitations, syncope or chest pain.   Current Outpatient Prescriptions  Medication Sig Dispense Refill  . amLODipine (NORVASC) 5 MG tablet TAKE ONE TABLET BY MOUTH ONCE DAILY  30 tablet  3  . aspirin EC 81 MG EC tablet Take 1 tablet (81 mg total) by mouth daily.      [name] atorvastatin (LIPITOR) 80 MG tablet TAKE ONE TABLET BY MOUTH ONCE DAILY  30 tablet  3  . lisinopril (PRINIVIL,ZESTRIL) 20 MG tablet TAKE ONE TABLET BY MOUTH ONCE DAILY  30 tablet  3  . metoprolol tartrate (LOPRESSOR) 25 MG tablet TAKE ONE TABLET BY MOUTH TWICE DAILY  60 tablet  3  . nitroGLYCERIN (NITROSTAT) 0.4 MG SL tablet Place 1 tablet (0.4 mg total) under the tongue every 5 (five) minutes x 3 doses as needed for chest pain.  25 tablet  3  . omeprazole (PRILOSEC OTC) 20 MG tablet Take 20 mg by mouth as needed.       . prasugrel (EFFIENT) 10 MG TABS TAKE ONE TABLET BY MOUTH ONCE DAILY  30 tablet  3   No current facility-administered medications for this visit.     Past Medical History  Diagnosis Date  . Hypertension   . CAD (coronary artery disease), native coronary artery 12/21/12    a. NSTEMI 2/14 => LHC 12/21/12: mLAD 90%, OM1 20%, mRCA 100%, EF 55-60%.  PCI: Xience Xpedition DES to mRCA and Xience Xpedition DES to mLAD.   [name] Tobacco abuse   . GERD (gastroesophageal reflux disease)   . Hyperlipidemia   . Psoriasis     Past Surgical History  Procedure Laterality Date  . Coronary angioplasty with stent placement  12/21/12    90% mid LAD s/p DES, 20% OM1, 100% mid RCA s/p DES; LVEF 55-60%    History   Social History  . Marital Status: Single    Spouse Name: N/A    Number of Children: N/A  . Years of  Education: N/A   Occupational History  . Not on file.   Social History Main Topics  . Smoking status: Former Smoker -- 0.25 packs/day for 20 years    Types: Cigarettes  . Smokeless tobacco: Not on file  . Alcohol Use: 0.5 oz/week    1 drink(s) per week  . Drug Use: Yes    Special: Marijuana  . Sexual Activity: Not on file   Other Topics Concern  . Not on file   Social History Narrative  . No narrative on file    ROS: no fevers or chills, productive cough, hemoptysis, dysphasia, odynophagia, melena, hematochezia, dysuria, hematuria, rash, seizure activity, orthopnea, PND, pedal edema, claudication. Remaining systems are negative.  Physical Exam: Well-developed well-nourished in no acute distress.  Skin is warm and dry.  HEENT is normal.  Neck is supple.  Chest is clear to auscultation with normal expansion.  Cardiovascular exam is regular rate and rhythm.  Abdominal exam nontender or distended. No masses palpated. Extremities show no edema. neuro grossly intact  ECG sinus bradycardia at a rate of 48. Left ventricular hypertrophy. RV conduction delay.

## 2013-08-21 NOTE — Assessment & Plan Note (Signed)
Blood pressure controlled. Continue present medications. 

## 2013-10-06 ENCOUNTER — Other Ambulatory Visit: Payer: Self-pay | Admitting: Cardiology

## 2013-10-15 ENCOUNTER — Other Ambulatory Visit: Payer: Self-pay | Admitting: Cardiology

## 2013-11-30 ENCOUNTER — Telehealth: Payer: Self-pay | Admitting: *Deleted

## 2013-11-30 NOTE — Telephone Encounter (Signed)
Requests effient samples. I will put them at the front desk for pick up.

## 2013-12-14 ENCOUNTER — Other Ambulatory Visit: Payer: Self-pay

## 2013-12-14 MED ORDER — AMLODIPINE BESYLATE 5 MG PO TABS: ORAL_TABLET | ORAL | Status: DC

## 2014-02-07 ENCOUNTER — Telehealth: Payer: Self-pay | Admitting: *Deleted

## 2014-02-07 NOTE — Telephone Encounter (Signed)
Effient samples left at the front desk for pick up.

## 2014-02-18 ENCOUNTER — Encounter (INDEPENDENT_AMBULATORY_CARE_PROVIDER_SITE_OTHER): Payer: Self-pay

## 2014-02-18 ENCOUNTER — Ambulatory Visit (INDEPENDENT_AMBULATORY_CARE_PROVIDER_SITE_OTHER): Payer: Self-pay | Admitting: Cardiology

## 2014-02-18 ENCOUNTER — Encounter: Payer: Self-pay | Admitting: Cardiology

## 2014-02-18 VITALS — BP 162/92 | HR 50 | Ht 68.0 in | Wt 222.8 lb

## 2014-02-18 DIAGNOSIS — Z720 Tobacco use: Secondary | ICD-10-CM

## 2014-02-18 DIAGNOSIS — F17200 Nicotine dependence, unspecified, uncomplicated: Secondary | ICD-10-CM

## 2014-02-18 DIAGNOSIS — I2510 Atherosclerotic heart disease of native coronary artery without angina pectoris: Secondary | ICD-10-CM

## 2014-02-18 LAB — CBC WITH DIFFERENTIAL/PLATELET
BASOS ABS: 0 10*3/uL (ref 0.0–0.1)
Basophils Relative: 0.7 % (ref 0.0–3.0)
EOS ABS: 0.1 10*3/uL (ref 0.0–0.7)
Eosinophils Relative: 1.8 % (ref 0.0–5.0)
HCT: 41.1 % (ref 39.0–52.0)
Hemoglobin: 13.8 g/dL (ref 13.0–17.0)
LYMPHS PCT: 47.5 % — AB (ref 12.0–46.0)
Lymphs Abs: 2.7 10*3/uL (ref 0.7–4.0)
MCHC: 33.6 g/dL (ref 30.0–36.0)
MCV: 90.3 fl (ref 78.0–100.0)
Monocytes Absolute: 0.4 10*3/uL (ref 0.1–1.0)
Monocytes Relative: 7 % (ref 3.0–12.0)
NEUTROS ABS: 2.5 10*3/uL (ref 1.4–7.7)
Neutrophils Relative %: 43 % (ref 43.0–77.0)
Platelets: 207 10*3/uL (ref 150.0–400.0)
RBC: 4.55 Mil/uL (ref 4.22–5.81)
RDW: 13.1 % (ref 11.5–14.6)
WBC: 5.7 10*3/uL (ref 4.5–10.5)

## 2014-02-18 LAB — BASIC METABOLIC PANEL
BUN: 18 mg/dL (ref 6–23)
CALCIUM: 9.4 mg/dL (ref 8.4–10.5)
CO2: 29 mEq/L (ref 19–32)
Chloride: 105 mEq/L (ref 96–112)
Creatinine, Ser: 0.9 mg/dL (ref 0.4–1.5)
GFR: 118.12 mL/min (ref >60.00)
Glucose, Bld: 87 mg/dL (ref 70–99)
Potassium: 4.3 mEq/L (ref 3.5–5.1)
Sodium: 140 mEq/L (ref 135–145)

## 2014-02-18 LAB — LIPID PANEL
CHOL/HDL RATIO: 3
Cholesterol: 160 mg/dL (ref 0–200)
HDL: 57.7 mg/dL (ref >39.00)
LDL Cholesterol: 92 mg/dL (ref 0–99)
Triglycerides: 54 mg/dL (ref 0.0–149.0)
VLDL: 10.8 mg/dL (ref 0.0–40.0)

## 2014-02-18 LAB — HEPATIC FUNCTION PANEL
ALT: 20 U/L (ref 0–53)
AST: 20 U/L (ref 0–37)
Albumin: 4.1 g/dL (ref 3.5–5.2)
Alkaline Phosphatase: 65 U/L (ref 39–117)
BILIRUBIN TOTAL: 0.4 mg/dL (ref 0.3–1.2)
Bilirubin, Direct: 0 mg/dL (ref 0.0–0.3)
Total Protein: 7.4 g/dL (ref 6.0–8.3)

## 2014-02-18 MED ORDER — AMLODIPINE BESYLATE 10 MG PO TABS: 10.0000 mg | ORAL_TABLET | Freq: Every day | ORAL | Status: DC

## 2014-02-18 NOTE — Assessment & Plan Note (Signed)
Blood pressure elevated. Increase amlodipine to 10 mg daily. Check potassium and renal function.

## 2014-02-18 NOTE — Progress Notes (Signed)
HPI: FU CAD. Admitted 2/14 with NSTEMI. LHC 12/21/12: mLAD 90%, OM1 20%, mRCA 100%, EF 55-60%. PCI: Xience Xpedition DES to mRCA and Xience Xpedition DES to mLAD. DAPT recommend x 1 year. Since he was last seen in Oct 2014, the patient denies any dyspnea on exertion, orthopnea, PND, pedal edema, palpitations, syncope or chest pain.   Current Outpatient Prescriptions  Medication Sig Dispense Refill  . amLODipine (NORVASC) 5 MG tablet TAKE ONE TABLET BY MOUTH ONCE DAILY  30 tablet  3  . aspirin EC 81 MG EC tablet Take 1 tablet (81 mg total) by mouth daily.      [name]. atorvastatin (LIPITOR) 80 MG tablet TAKE ONE TABLET BY MOUTH ONCE DAILY  30 tablet  3  . atorvastatin (LIPITOR) 80 MG tablet TAKE ONE TABLET BY MOUTH ONCE DAILY  30 tablet  3  . lisinopril (PRINIVIL,ZESTRIL) 20 MG tablet TAKE ONE TABLET BY MOUTH ONCE DAILY  30 tablet  3  . metoprolol tartrate (LOPRESSOR) 25 MG tablet TAKE ONE TABLET BY MOUTH TWICE DAILY  60 tablet  3  . nitroGLYCERIN (NITROSTAT) 0.4 MG SL tablet Place 1 tablet (0.4 mg total) under the tongue every 5 (five) minutes x 3 doses as needed for chest pain.  25 tablet  3  . omeprazole (PRILOSEC OTC) 20 MG tablet Take 20 mg by mouth as needed.       . prasugrel (EFFIENT) 10 MG TABS TAKE ONE TABLET BY MOUTH ONCE DAILY  30 tablet  3   No current facility-administered medications for this visit.     Past Medical History  Diagnosis Date  . Hypertension   . CAD (coronary artery disease), native coronary artery 12/21/12    a. NSTEMI 2/14 => LHC 12/21/12: mLAD 90%, OM1 20%, mRCA 100%, EF 55-60%.  PCI: Xience Xpedition DES to mRCA and Xience Xpedition DES to mLAD.   [name]. Tobacco abuse   . GERD (gastroesophageal reflux disease)   . Hyperlipidemia   . Psoriasis     Past Surgical History  Procedure Laterality Date  . Coronary angioplasty with stent placement  12/21/12    90% mid LAD s/p DES, 20% OM1, 100% mid RCA s/p DES; LVEF 55-60%    History   Social History  .  Marital Status: Single    Spouse Name: N/A    Number of Children: N/A  . Years of Education: N/A   Occupational History  . Not on file.   Social History Main Topics  . Smoking status: Current Some Day Smoker -- 0.25 packs/day for 20 years    Types: Cigarettes  . Smokeless tobacco: Not on file  . Alcohol Use: 0.5 oz/week    1 drink(s) per week  . Drug Use: Yes    Special: Marijuana  . Sexual Activity: Not on file   Other Topics Concern  . Not on file   Social History Narrative  . No narrative on file    ROS: Low back pain that has now improved but no fevers or chills, productive cough, hemoptysis, dysphasia, odynophagia, melena, hematochezia, dysuria, hematuria, rash, seizure activity, orthopnea, PND, pedal edema, claudication. Remaining systems are negative.  Physical Exam: Well-developed well-nourished in no acute distress.  Skin is warm and dry.  HEENT is normal.  Neck is supple.  Chest is clear to auscultation with normal expansion.  Cardiovascular exam is regular rate and rhythm.  Abdominal exam nontender or distended. No masses palpated. Extremities show no edema. neuro grossly intact  ECG Sinus bradycardia at a rate of 50. RV conduction delay. No ST changes.

## 2014-02-18 NOTE — Assessment & Plan Note (Signed)
Patient counseled on discontinuing. 

## 2014-02-18 NOTE — Assessment & Plan Note (Signed)
Continue aspirin and statin. Discontinue effient as stents were placed over one year ago. He has ecchymosis on his finger. Check platelet count.

## 2014-02-18 NOTE — Patient Instructions (Signed)
Your physician wants you to follow-up in: ONE YEAR WITH DR Shelda PalRENSHAW You will receive a reminder letter in the mail two months in advance. If you don't receive a letter, please call our office to schedule the follow-up appointment.   STOP EFFIENT  INCREASE AMLODIPINE TO 10 MG ONCE DAILY  Your physician recommends that you HAVE LAB WORK TODAY

## 2014-02-18 NOTE — Assessment & Plan Note (Signed)
Continue statin. Check lipids and liver. 

## 2014-02-20 ENCOUNTER — Encounter: Payer: Self-pay | Admitting: *Deleted

## 2014-03-05 ENCOUNTER — Other Ambulatory Visit: Payer: Self-pay | Admitting: Cardiology

## 2014-08-24 ENCOUNTER — Other Ambulatory Visit: Payer: Self-pay

## 2014-08-24 MED ORDER — METOPROLOL TARTRATE 25 MG PO TABS: ORAL_TABLET | ORAL | Status: DC

## 2014-10-03 ENCOUNTER — Encounter (HOSPITAL_COMMUNITY): Payer: Self-pay | Admitting: Cardiovascular Disease

## 2015-02-18 NOTE — Progress Notes (Signed)
      HPI: FU CAD. Admitted 2/14 with NSTEMI. LHC 12/21/12: mLAD 90%, OM1 20%, mRCA 100%, EF 55-60%. PCI: Xience Xpedition DES to mRCA and Xience Xpedition DES to mLAD. Since he was last seen the patient denies any dyspnea on exertion, orthopnea, PND, pedal edema, palpitations, syncope or chest pain.   Current Outpatient Prescriptions  Medication Sig Dispense Refill  . amLODipine (NORVASC) 10 MG tablet Take 1 tablet (10 mg total) by mouth daily. TAKE ONE TABLET BY MOUTH ONCE DAILY 90 tablet 3  . aspirin EC 81 MG EC tablet Take 1 tablet (81 mg total) by mouth daily.    [name]. atorvastatin (LIPITOR) 80 MG tablet TAKE ONE TABLET BY MOUTH ONCE DAILY 30 tablet 3  . lisinopril (PRINIVIL,ZESTRIL) 20 MG tablet TAKE ONE TABLET BY MOUTH ONCE DAILY 30 tablet 9  . metoprolol tartrate (LOPRESSOR) 25 MG tablet TAKE ONE TABLET BY MOUTH TWICE DAILY 60 tablet 6  . nitroGLYCERIN (NITROSTAT) 0.4 MG SL tablet Place 1 tablet (0.4 mg total) under the tongue every 5 (five) minutes x 3 doses as needed for chest pain. 25 tablet 3   No current facility-administered medications for this visit.     Past Medical History  Diagnosis Date  . Hypertension   . CAD (coronary artery disease), native coronary artery 12/21/12    a. NSTEMI 2/14 => LHC 12/21/12: mLAD 90%, OM1 20%, mRCA 100%, EF 55-60%.  PCI: Xience Xpedition DES to mRCA and Xience Xpedition DES to mLAD.   [name]. Tobacco abuse   . GERD (gastroesophageal reflux disease)   . Hyperlipidemia   . Psoriasis     Past Surgical History  Procedure Laterality Date  . Coronary angioplasty with stent placement  12/21/12    90% mid LAD s/p DES, 20% OM1, 100% mid RCA s/p DES; LVEF 55-60%  . Left heart catheterization with coronary angiogram N/A 12/21/2012    Procedure: LEFT HEART CATHETERIZATION WITH CORONARY ANGIOGRAM;  Surgeon: Kathleene Hazelhristopher D McAlhany, MD;  [facility];  Service: Cardiovascular;  Laterality: N/A;    History   Social History  . Marital Status:  Single    Spouse Name: N/A  . Number of Children: N/A  . Years of Education: N/A   Occupational History  . Not on file.   Social History Main Topics  . Smoking status: Current Some Day Smoker -- 0.25 packs/day for 20 years    Types: Cigarettes  . Smokeless tobacco: Not on file  . Alcohol Use: 0.6 oz/week    1 Standard drinks or equivalent per week  . Drug Use: Yes    Special: Marijuana  . Sexual Activity: Not on file   Other Topics Concern  . Not on file   Social History Narrative    ROS: recent URI but no hemoptysis, dysphasia, odynophagia, melena, hematochezia, dysuria, hematuria, rash, seizure activity, orthopnea, PND, pedal edema, claudication. Remaining systems are negative.  Physical Exam: Well-developed well-nourished in no acute distress.  Skin is warm and dry.  HEENT is normal.  Neck is supple.  Chest is clear to auscultation with normal expansion.  Cardiovascular exam is regular rate and rhythm.  Abdominal exam nontender or distended. No masses palpated. Extremities show no edema. neuro grossly intact  ECG sinus rhythm at a rate of 61. No ST changes.

## 2015-02-20 ENCOUNTER — Encounter: Payer: Self-pay | Admitting: Cardiology

## 2015-02-20 ENCOUNTER — Ambulatory Visit (INDEPENDENT_AMBULATORY_CARE_PROVIDER_SITE_OTHER): Payer: Self-pay | Admitting: Cardiology

## 2015-02-20 VITALS — BP 140/82 | HR 61 | Ht 68.0 in | Wt 210.0 lb

## 2015-02-20 DIAGNOSIS — I2510 Atherosclerotic heart disease of native coronary artery without angina pectoris: Secondary | ICD-10-CM

## 2015-02-20 MED ORDER — LISINOPRIL 40 MG PO TABS: 40.0000 mg | ORAL_TABLET | Freq: Every day | ORAL | Status: DC

## 2015-02-20 NOTE — Patient Instructions (Signed)
Your physician wants you to follow-up in: ONE YEAR WITH DR Shelda PalRENSHAW You will receive a reminder letter in the mail two months in advance. If you don't receive a letter, please call our office to schedule the follow-up appointment.   INCREASE LISINOPRIL TO 40 MG ONCE DAILY=2 OF THE 20 MG TABLETS ONCE DAILY  Your physician recommends that you return for lab work in: ONE WEEK-DO NOT EAT PRIOR TO LAB WORK

## 2015-02-20 NOTE — Assessment & Plan Note (Signed)
Continue statin. Check lipids and liver. 

## 2015-02-20 NOTE — Assessment & Plan Note (Signed)
Patient counseled on discontinuing. 

## 2015-02-20 NOTE — Assessment & Plan Note (Signed)
Continue aspirin and statin. 

## 2015-02-20 NOTE — Assessment & Plan Note (Signed)
Blood pressure elevated. Increase lisinopril to 40 mg daily. Check potassium and renal function in 1 week.

## 2015-03-10 ENCOUNTER — Other Ambulatory Visit: Payer: Self-pay | Admitting: Physician Assistant

## 2015-04-07 ENCOUNTER — Other Ambulatory Visit: Payer: Self-pay

## 2015-04-07 DIAGNOSIS — Z720 Tobacco use: Secondary | ICD-10-CM

## 2015-04-07 DIAGNOSIS — I2510 Atherosclerotic heart disease of native coronary artery without angina pectoris: Secondary | ICD-10-CM

## 2015-04-07 MED ORDER — ATORVASTATIN CALCIUM 80 MG PO TABS: ORAL_TABLET | ORAL | Status: DC

## 2015-04-07 MED ORDER — AMLODIPINE BESYLATE 10 MG PO TABS: 10.0000 mg | ORAL_TABLET | Freq: Every day | ORAL | Status: DC

## 2015-04-29 ENCOUNTER — Telehealth: Payer: Self-pay | Admitting: Cardiology

## 2015-04-29 NOTE — Telephone Encounter (Signed)
[name]  

## 2015-04-29 NOTE — Telephone Encounter (Signed)
Patient called in reporting pain that "feels like heartburn". Notes intermittent, has not happened consistently but perhaps every day or two for a few days. Pain in chest and in back - notes 3 out of 10, dull in nature. Denies SOB, other symptoms.  Asked about Nitro SL use, he states he has med but has not made use.  He is asymptomatic at this time - advised and gave directions on Nitro SL use if chest pain returns.  Pt declines to go to ED for eval - requesting appt.  Informed him i would defer to Dr. Jens Somrenshaw - see if same-day/flex or asap PA visit needed. Pt voiced understanding.

## 2015-04-29 NOTE — Telephone Encounter (Signed)
Request sent to scheduling.

## 2015-05-01 ENCOUNTER — Encounter: Payer: Self-pay | Admitting: Cardiology

## 2015-05-01 ENCOUNTER — Ambulatory Visit (INDEPENDENT_AMBULATORY_CARE_PROVIDER_SITE_OTHER): Payer: Self-pay | Admitting: Cardiology

## 2015-05-01 VITALS — BP 154/88 | HR 48 | Ht 68.0 in | Wt 217.2 lb

## 2015-05-01 DIAGNOSIS — I10 Essential (primary) hypertension: Secondary | ICD-10-CM

## 2015-05-01 DIAGNOSIS — I2510 Atherosclerotic heart disease of native coronary artery without angina pectoris: Secondary | ICD-10-CM

## 2015-05-01 DIAGNOSIS — R001 Bradycardia, unspecified: Secondary | ICD-10-CM

## 2015-05-01 DIAGNOSIS — Z720 Tobacco use: Secondary | ICD-10-CM

## 2015-05-01 DIAGNOSIS — I25118 Atherosclerotic heart disease of native coronary artery with other forms of angina pectoris: Secondary | ICD-10-CM

## 2015-05-01 MED ORDER — AMLODIPINE BESYLATE 10 MG PO TABS: 10.0000 mg | ORAL_TABLET | Freq: Every day | ORAL | Status: DC

## 2015-05-01 MED ORDER — NITROGLYCERIN 0.4 MG SL SUBL: SUBLINGUAL_TABLET | SUBLINGUAL | Status: DC

## 2015-05-01 MED ORDER — METOPROLOL TARTRATE 25 MG PO TABS: ORAL_TABLET | ORAL | Status: DC

## 2015-05-01 MED ORDER — LISINOPRIL 40 MG PO TABS: 40.0000 mg | ORAL_TABLET | Freq: Every day | ORAL | Status: DC

## 2015-05-01 NOTE — Progress Notes (Signed)
Cardiology Office Note   Date:  05/01/2015   ID:  Bradley MaffucciAlvin G Grunwald Wolfe., DOB 1971/08/25, MRN [id number]  PCP:  Rama Loreen Freud([name]) Clarkra, MD  Cardiologist:  Dr. Velta AddisonB. Crenshaw     Chief Complaint  Patient presents with  . Follow-up    CAD      History of Present Illness: Bradley Wolfe. is a 44 y.o. male who presents for chest pain.  Has been slowly increasing in intensity. No associated symptoms. He has some dizziness when lying flat.  None with activity.  He also complains of anxiety.  Hx in  2/14 with NSTEMI. LHC 12/21/12: mLAD 90%, OM1 20%, mRCA 100%, EF 55-60%. PCI: Xience Xpedition DES to mRCA and Xience Xpedition DES to mLAD. Since he was last seen by Dr. Jens Somrenshaw 01/2015 he has developed these symptoms.  He continues to smoke but only 1 or 2 cigarettes a day and we discussed he should stop.   He has not been taking the amlodipine- he could not afford but he can now. We will reorder.  Past Medical History  Diagnosis Date  . Hypertension   . CAD (coronary artery disease), native coronary artery 12/21/12    a. NSTEMI 2/14 => LHC 12/21/12: mLAD 90%, OM1 20%, mRCA 100%, EF 55-60%.  PCI: Xience Xpedition DES to mRCA and Xience Xpedition DES to mLAD.   Bradley Wolfe. Tobacco abuse   . GERD (gastroesophageal reflux disease)   . Hyperlipidemia   . Psoriasis     Past Surgical History  Procedure Laterality Date  . Coronary angioplasty with stent placement  12/21/12    90% mid LAD s/p DES, 20% OM1, 100% mid RCA s/p DES; LVEF 55-60%  . Left heart catheterization with coronary angiogram N/A 12/21/2012    Procedure: LEFT HEART CATHETERIZATION WITH CORONARY ANGIOGRAM;  Surgeon: Kathleene Hazelhristopher D McAlhany, MD;  [facility];  Service: Cardiovascular;  Laterality: N/A;     Current Outpatient Prescriptions  Medication Sig Dispense Refill  . amLODipine (NORVASC) 10 MG tablet Take 1 tablet (10 mg total) by mouth daily. TAKE ONE TABLET BY MOUTH ONCE DAILY 90 tablet 3  . aspirin EC  81 MG EC tablet Take 1 tablet (81 mg total) by mouth daily.    Bradley Wolfe. atorvastatin (LIPITOR) 80 MG tablet TAKE ONE TABLET BY MOUTH ONCE DAILY 30 tablet 6  . lisinopril (PRINIVIL,ZESTRIL) 40 MG tablet Take 1 tablet (40 mg total) by mouth daily. 90 tablet 3  . metoprolol tartrate (LOPRESSOR) 25 MG tablet TAKE ONE TABLET BY MOUTH TWICE DAILY 60 tablet 6  . NITROSTAT 0.4 MG SL tablet DISSOLVE ONE TABLET UNDER THE TONGUE EVERY 5 MINUTES AS NEEDED FOR CHEST PAIN.  DO NOT EXCEED A TOTAL OF 3 DOSES IN 15 MINUTES 25 tablet 5   No current facility-administered medications for this visit.    Allergies:   Review of patient's allergies indicates no known allergies.    Social History:  The patient  reports that he has been smoking Cigarettes.  He has a 5 pack-year smoking history. He does not have any smokeless tobacco history on file. He reports that he drinks about 0.6 oz of alcohol per week. He reports that he uses illicit drugs (Marijuana).   Family History:  The patient's family history includes Heart attack in his father and mother.    ROS:  General:no colds or fevers,  weight up and down Skin:no rashes or ulcers HEENT:no blurred vision, no congestion CV:see HPI PUL:see HPI GI:no diarrhea  constipation or melena, no indigestion GU:no hematuria, no dysuria MS:no joint pain, no claudication Neuro:no syncope, no lightheadedness Endo:no diabetes, no thyroid disease  Wt Readings from Last 3 Encounters:  05/01/15 217 lb 3.2 oz (98.521 kg)  02/20/15 210 lb (95.255 kg)  02/18/14 222 lb 12.8 oz (101.061 kg)     PHYSICAL EXAM: VS:  BP 154/88 mmHg  Pulse 48  Ht  (1.727 m)  Wt 217 lb 3.2 oz (98.521 kg)  BMI 33.03 kg/m2  SpO2 96% , BMI Body mass index is 33.03 kg/(m^2). General:Pleasant affect, NAD Skin:Warm and dry, brisk capillary refill HEENT:normocephalic, sclera clear, mucus membranes moist Neck:supple, no JVD, no bruits  Heart:S1S2 RRR without murmur, gallup, rub or click,  With movement  his HR increases Lungs:clear without rales, rhonchi, or wheezes ZOX:WRUE, non tender, + BS, do not palpate liver spleen or masses Ext:no lower ext edema, 2+ pedal pulses, 2+ radial pulses Neuro:alert and oriented X 3, MAE, follows commands, + facial symmetry    EKG:  EKG is ordered today. The ekg ordered today demonstrates SB at 66 with LVH and no acute changes from previous EKG.    Recent Labs: No results found for requested labs within last 365 days.    Lipid Panel    Component Value Date/Time   CHOL 160 02/18/2014 1026   TRIG 54.0 02/18/2014 1026   HDL 57.70 02/18/2014 1026   CHOLHDL 3 02/18/2014 1026   VLDL 10.8 02/18/2014 1026   LDLCALC 92 02/18/2014 1026       Other studies Reviewed: Additional studies/ records that were reviewed today include: previous visits. Previous cath.    ASSESSMENT AND PLAN:  1.  Chest pain, slow increase of intensity and frequency.  Will plan for lexiscan myoview and follow up after study in about 2 weeks, though if test + will address earlier.Bradley Wolfe  2.EKG with bradycardia but no symptoms, discussed with Dr. Elease Hashimoto - will leave at current dose.  3.  HTN resume his amlodipine which may help with his chest pain as well.  4. Hyperlipidemia continue statin     Current medicines are reviewed with the patient today.  The patient Has no concerns regarding medicines.  The following changes have been made:  See above Labs/ tests ordered today include:see above  Disposition:   FU:  see above  Signed, Leone Brand, NP  05/01/2015 11:35 AM    [facility] [address] [place], [place], [place]  [address]/ [address] [place], [place] Phone: [phone]; Fax: [phone]  [phone]

## 2015-05-01 NOTE — Patient Instructions (Addendum)
Medication Instructions:  Your physician recommends that you continue on your current medications as directed. Please refer to the Current Medication list given to you today.  Labwork: NONE  Testing/Procedures: Your physician has requested that you have a lexiscan myoview. For further information please visit [email or web address]. Please follow instruction sheet, as given.  Follow-Up: Your physician recommends that you schedule a follow-up appointment in: 2 weeks with Flex for results.   Any Other Special Instructions Will Be Listed Below (If Applicable).

## 2015-05-02 ENCOUNTER — Encounter: Payer: Self-pay | Admitting: Cardiology

## 2015-05-02 ENCOUNTER — Telehealth (HOSPITAL_COMMUNITY): Payer: Self-pay | Admitting: *Deleted

## 2015-05-02 NOTE — Telephone Encounter (Signed)
Patient given detailed instructions per Myocardial Perfusion Study Information Sheet for test on 05/05/15 at 8:30. Patient Notified to arrive 15 minutes early, and that it is imperative to arrive on time for appointment to keep from having the test rescheduled. Patient verbalized understanding. Daneil DolinSharon S Brooks

## 2015-05-05 ENCOUNTER — Ambulatory Visit (HOSPITAL_COMMUNITY): Payer: Self-pay | Attending: Internal Medicine

## 2015-05-05 DIAGNOSIS — R9439 Abnormal result of other cardiovascular function study: Secondary | ICD-10-CM | POA: Insufficient documentation

## 2015-05-05 DIAGNOSIS — I25118 Atherosclerotic heart disease of native coronary artery with other forms of angina pectoris: Secondary | ICD-10-CM | POA: Insufficient documentation

## 2015-05-05 DIAGNOSIS — I10 Essential (primary) hypertension: Secondary | ICD-10-CM | POA: Insufficient documentation

## 2015-05-05 LAB — MYOCARDIAL PERFUSION IMAGING
CHL CUP NUCLEAR SDS: 0
CSEPPHR: 78 {beats}/min
LVDIAVOL: 153 mL
LVSYSVOL: 79 mL
RATE: 0.29
Rest HR: 41 {beats}/min
SRS: 0
SSS: 0
TID: 1.11

## 2015-05-05 MED ORDER — REGADENOSON 0.4 MG/5ML IV SOLN
0.4000 mg | Freq: Once | INTRAVENOUS | Status: AC
  Administered 2015-05-05: 0.4 mg via INTRAVENOUS

## 2015-05-05 MED ORDER — TECHNETIUM TC 99M SESTAMIBI GENERIC - CARDIOLITE
10.9000 | Freq: Once | INTRAVENOUS | Status: AC | PRN
  Administered 2015-05-05: 10.9 via INTRAVENOUS

## 2015-05-05 MED ORDER — TECHNETIUM TC 99M SESTAMIBI GENERIC - CARDIOLITE
30.6000 | Freq: Once | INTRAVENOUS | Status: AC | PRN
  Administered 2015-05-05: 31 via INTRAVENOUS

## 2015-05-15 ENCOUNTER — Encounter: Payer: Self-pay | Admitting: Physician Assistant

## 2015-05-15 ENCOUNTER — Ambulatory Visit (INDEPENDENT_AMBULATORY_CARE_PROVIDER_SITE_OTHER): Payer: Self-pay | Admitting: Physician Assistant

## 2015-05-15 VITALS — BP 142/84 | HR 53 | Ht 68.0 in | Wt 217.0 lb

## 2015-05-15 DIAGNOSIS — Z79899 Other long term (current) drug therapy: Secondary | ICD-10-CM

## 2015-05-15 DIAGNOSIS — E785 Hyperlipidemia, unspecified: Secondary | ICD-10-CM

## 2015-05-15 LAB — HEPATIC FUNCTION PANEL
ALBUMIN: 4.4 g/dL (ref 3.5–5.2)
ALT: 35 U/L (ref 0–53)
AST: 26 U/L (ref 0–37)
Alkaline Phosphatase: 81 U/L (ref 39–117)
BILIRUBIN DIRECT: 0.1 mg/dL (ref 0.0–0.3)
Total Bilirubin: 0.5 mg/dL (ref 0.2–1.2)
Total Protein: 7.1 g/dL (ref 6.0–8.3)

## 2015-05-15 LAB — BASIC METABOLIC PANEL
BUN: 11 mg/dL (ref 6–23)
CALCIUM: 9.4 mg/dL (ref 8.4–10.5)
CO2: 25 mEq/L (ref 19–32)
Chloride: 105 mEq/L (ref 96–112)
Creatinine, Ser: 0.87 mg/dL (ref 0.40–1.50)
GFR: 122.13 mL/min (ref >60.00)
Glucose, Bld: 84 mg/dL (ref 70–99)
Potassium: 4.3 mEq/L (ref 3.5–5.1)
Sodium: 137 mEq/L (ref 135–145)

## 2015-05-15 LAB — LIPID PANEL
Cholesterol: 128 mg/dL (ref 0–200)
HDL: 52.6 mg/dL (ref >39.00)
LDL CALC: 64 mg/dL (ref 0–99)
NONHDL: 75.4
Total CHOL/HDL Ratio: 2
Triglycerides: 59 mg/dL (ref 0.0–149.0)
VLDL: 11.8 mg/dL (ref 0.0–40.0)

## 2015-05-15 NOTE — Patient Instructions (Signed)
Medication Instructions:  Your physician recommends that you continue on your current medications as directed. Please refer to the Current Medication list given to you today.  Labwork: Your physician recommends that you have lab work today. BMET, Lipids and Hepatic panel.  Testing/Procedures: NONE  Follow-Up: Your physician wants you to follow-up in: April 2016 with Dr. Jens Som. You will receive a reminder letter in the mail two months in advance. If you don't receive a letter, please call our office to schedule the follow-up appointment.

## 2015-05-15 NOTE — Progress Notes (Signed)
Cardiology Office Note   Date:  05/15/2015   ID:  Bradley Wolfe., DOB [birth year], MRN [id number]  PCP:  Rama Loreen Freud, MD  Cardiologist:  Dr Rosemarie Beath, PA-C   Follow-up after stress testing  History of Present Illness: Bradley Wolfe. is a 44 y.o. male with a history of NSTEMI. LHC 12/21/12: mLAD 90%, OM1 20%, mRCA 100%, EF 55-60%. PCI: Xience Xpedition DES to mRCA and Xience Xpedition DES to mLAD.   Bradley Wolfe. presents for followup after his stress test. Bradley Wolfe has had a decrease in his episodes of chest pain. He has only had two recently, the same as the chest pain he had before the stress test. These episodes are not associated with exertion and resolved spontaneously. He feels he is doing well, and is trying to be compliant with his blood pressure medications, although he admits he has not taken them consistently.   Past Medical History  Diagnosis Date  . Hypertension   . CAD (coronary artery disease), native coronary artery 12/21/12    a. NSTEMI 2/14 => LHC 12/21/12: mLAD 90%, OM1 20%, mRCA 100%, EF 55-60%.  PCI: Xience Xpedition DES to mRCA and Xience Xpedition DES to mLAD.   [name] Tobacco abuse   . GERD (gastroesophageal reflux disease)   . Hyperlipidemia   . Psoriasis     Past Surgical History  Procedure Laterality Date  . Coronary angioplasty with stent placement  12/21/12    90% mid LAD s/p DES, 20% OM1, 100% mid RCA s/p DES; LVEF 55-60%  . Left heart catheterization with coronary angiogram N/A 12/21/2012    Procedure: LEFT HEART CATHETERIZATION WITH CORONARY ANGIOGRAM;  Surgeon: Kathleene Hazel, MD;  Location: [facility] CATH LAB;  Service: Cardiovascular;  Laterality: N/A;    Current Outpatient Prescriptions  Medication Sig Dispense Refill  . amLODipine (NORVASC) 10 MG tablet Take 1 tablet (10 mg total) by mouth daily. TAKE ONE TABLET BY MOUTH ONCE DAILY 30 tablet 11  . aspirin EC 81 MG EC tablet  Take 1 tablet (81 mg total) by mouth daily.    [name] atorvastatin (LIPITOR) 80 MG tablet TAKE ONE TABLET BY MOUTH ONCE DAILY 30 tablet 6  . lisinopril (PRINIVIL,ZESTRIL) 40 MG tablet Take 1 tablet (40 mg total) by mouth daily. 30 tablet 11  . metoprolol tartrate (LOPRESSOR) 25 MG tablet TAKE ONE TABLET BY MOUTH TWICE DAILY 60 tablet 11  . nitroGLYCERIN (NITROSTAT) 0.4 MG SL tablet DISSOLVE ONE TABLET UNDER THE TONGUE EVERY 5 MINUTES AS NEEDED FOR CHEST PAIN.  DO NOT EXCEED A TOTAL OF 3 DOSES IN 15 MINUTES 25 tablet 5   No current facility-administered medications for this visit.    Allergies:   Review of patient's allergies indicates no known allergies.    Social History:  The patient  reports that he has been smoking Cigarettes.  He has a 5 pack-year smoking history. He has never used smokeless tobacco. He reports that he drinks about 0.6 oz of alcohol per week. He reports that he uses illicit drugs (Marijuana).   Family History:  The patient's family history includes Heart attack in his father and mother.    ROS:  Please see the history of present illness. All other systems are reviewed and negative.    PHYSICAL EXAM: VS:  BP 142/84 mmHg  Pulse 53  Ht 5\' 8"  (1.727 m)  Wt 217 lb (98.431 kg)  BMI 33.00 kg/m2  SpO2 98% ,  BMI Body mass index is 33 kg/(m^2). GEN: Well nourished, well developed, in no acute distress HEENT: normal, With poor dentition  Neck: no JVD, carotid bruits, or masses Cardiac: RRR; no murmurs, rubs, or gallops,no edema  Respiratory:  clear to auscultation bilaterally, normal work of breathing GI: soft, nontender, nondistended, + BS MS: no deformity or atrophy; of note, nails are neatly trimmed except for both fifth fingers which have longer nails Skin: warm and dry, no rash Neuro:  Strength and sensation are intact Psych: euthymic mood, full affect   EKG:  EKG is not ordered today.   Recent Labs: 05/15/2015: ALT 35; BUN 11; Creatinine, Ser 0.87; Potassium 4.3;  Sodium 137  MV 05/05/2015  Nuclear stress EF: 49%.  There was no ST segment deviation noted during stress.  This is a low risk study.  The left ventricular ejection fraction is mildly decreased (45-54%).  Low risk stress nuclear study with a moderate size, medium intensity, partially reversible inferior defect consistent with diaphragmatic attenuation vs small prior infarct; very mild inferior ischemia; EF 49 with global hypokinesis.      Lipid Panel    Component Value Date/Time   CHOL 128 05/15/2015 1102   TRIG 59.0 05/15/2015 1102   HDL 52.60 05/15/2015 1102   CHOLHDL 2 05/15/2015 1102   VLDL 11.8 05/15/2015 1102   LDLCALC 64 05/15/2015 1102     Wt Readings from Last 3 Encounters:  05/15/15 217 lb (98.431 kg)  05/05/15 217 lb (98.431 kg)  05/01/15 217 lb 3.2 oz (98.521 kg)     Other studies Reviewed: Additional studies/ records that were reviewed today include: Previous cath report and office visits  ASSESSMENT AND PLAN:  1.  Chest pain, history of CAD: Continue current therapy, compliance with heart-healthy lifestyle modifications and medications emphasized. He has not eaten today, so we will go ahead and draw the labs that were ordered in April. Follow up with Dr. Jens Som as scheduled.  2. Hyperlipidemia: Continue statin, see above  Current medicines are reviewed at length with the patient today.  The patient does not have concerns regarding medicines.  The following changes have been made:  no change  Labs/ tests ordered today include:   Orders Placed This Encounter  Procedures  . Basic Metabolic Panel (BMET)  . Lipid Profile  . Hepatic function panel     Disposition:   FU with Dr. Jens Som as scheduled  Signed, Leanna Battles  05/15/2015 2:00 PM    [facility] [address] [place], [place], [place]  [address] Phone: [phone]; Fax: [phone]

## 2015-08-12 ENCOUNTER — Encounter (HOSPITAL_COMMUNITY): Payer: Self-pay | Admitting: Emergency Medicine

## 2015-08-12 ENCOUNTER — Emergency Department (HOSPITAL_COMMUNITY)
Admission: EM | Admit: 2015-08-12 | Discharge: 2015-08-12 | Disposition: A | Discharge status: Home / Self Care | Payer: Self-pay | Attending: Emergency Medicine | Admitting: Emergency Medicine

## 2015-08-12 DIAGNOSIS — E785 Hyperlipidemia, unspecified: Secondary | ICD-10-CM | POA: Insufficient documentation

## 2015-08-12 DIAGNOSIS — Z8719 Personal history of other diseases of the digestive system: Secondary | ICD-10-CM | POA: Insufficient documentation

## 2015-08-12 DIAGNOSIS — M546 Pain in thoracic spine: Secondary | ICD-10-CM | POA: Insufficient documentation

## 2015-08-12 DIAGNOSIS — I10 Essential (primary) hypertension: Secondary | ICD-10-CM | POA: Insufficient documentation

## 2015-08-12 DIAGNOSIS — Z7982 Long term (current) use of aspirin: Secondary | ICD-10-CM | POA: Insufficient documentation

## 2015-08-12 DIAGNOSIS — Z720 Tobacco use: Secondary | ICD-10-CM | POA: Insufficient documentation

## 2015-08-12 DIAGNOSIS — Z79899 Other long term (current) drug therapy: Secondary | ICD-10-CM | POA: Insufficient documentation

## 2015-08-12 DIAGNOSIS — R079 Chest pain, unspecified: Secondary | ICD-10-CM | POA: Insufficient documentation

## 2015-08-12 DIAGNOSIS — I2510 Atherosclerotic heart disease of native coronary artery without angina pectoris: Secondary | ICD-10-CM | POA: Insufficient documentation

## 2015-08-12 MED ORDER — DIAZEPAM 2 MG PO TABS
2.0000 mg | ORAL_TABLET | Freq: Once | ORAL | Status: AC
  Administered 2015-08-12: 2 mg via ORAL
  Filled 2015-08-12: qty 1

## 2015-08-12 MED ORDER — DIAZEPAM 5 MG PO TABS: 2.5000 mg | ORAL_TABLET | Freq: Four times a day (QID) | ORAL | Status: DC | PRN

## 2015-08-12 MED ORDER — OXYCODONE HCL 5 MG PO TABS: 5.0000 mg | ORAL_TABLET | ORAL | Status: DC | PRN

## 2015-08-12 MED ORDER — OXYCODONE HCL 5 MG PO TABS
5.0000 mg | ORAL_TABLET | Freq: Once | ORAL | Status: AC
Start: 2015-08-12 — End: 2015-08-12
  Administered 2015-08-12: 5 mg via ORAL
  Filled 2015-08-12: qty 1

## 2015-08-12 NOTE — ED Notes (Signed)
Pt states neck pain started then moved into back. Now hurts in chest and shoulder. Worse pain with inspiration and movement. Neck pain resolved but still has pain behind R shoulder blade and chest area.

## 2015-08-12 NOTE — Discharge Instructions (Signed)
Take tylenol 1000mg  every 6 hours for pain.  Take the pain medicine if you need to ontop of the tylenol.  Follow up with your PCP in 1-2 days.  Back Pain, Adult Back pain is very common in adults.The cause of back pain is rarely dangerous and the pain often gets better over time.The cause of your back pain may not be known. Some common causes of back pain include:  Strain of the muscles or ligaments supporting the spine.  Wear and tear (degeneration) of the spinal disks.  Arthritis.  Direct injury to the back. For many people, back pain may return. Since back pain is rarely dangerous, most people can learn to manage this condition on their own. HOME CARE INSTRUCTIONS Watch your back pain for any changes. The following actions may help to lessen any discomfort you are feeling:  Remain active. It is stressful on your back to sit or stand in one place for long periods of time. Do not sit, drive, or stand in one place for more than 30 minutes at a time. Take short walks on even surfaces as soon as you are able.Try to increase the length of time you walk each day.  Exercise regularly as directed by your health care provider. Exercise helps your back heal faster. It also helps avoid future injury by keeping your muscles strong and flexible.  Do not stay in bed.Resting more than 1-2 days can delay your recovery.  Pay attention to your body when you bend and lift. The most comfortable positions are those that put less stress on your recovering back. Always use proper lifting techniques, including:  Bending your knees.  Keeping the load close to your body.  Avoiding twisting.  Find a comfortable position to sleep. Use a firm mattress and lie on your side with your knees slightly bent. If you lie on your back, put a pillow under your knees.  Avoid feeling anxious or stressed.Stress increases muscle tension and can worsen back pain.It is important to recognize when you are anxious or  stressed and learn ways to manage it, such as with exercise.  Take medicines only as directed by your health care provider. Over-the-counter medicines to reduce pain and inflammation are often the most helpful.Your health care provider may prescribe muscle relaxant drugs.These medicines help dull your pain so you can more quickly return to your normal activities and healthy exercise.  Apply ice to the injured area:  Put ice in a plastic bag.  Place a towel between your skin and the bag.  Leave the ice on for 20 minutes, 2-3 times a day for the first 2-3 days. After that, ice and heat may be alternated to reduce pain and spasms.  Maintain a healthy weight. Excess weight puts extra stress on your back and makes it difficult to maintain good posture. SEEK MEDICAL CARE IF:  You have pain that is not relieved with rest or medicine.  You have increasing pain going down into the legs or buttocks.  You have pain that does not improve in one week.  You have night pain.  You lose weight.  You have a fever or chills. SEEK IMMEDIATE MEDICAL CARE IF:   You develop new bowel or bladder control problems.  You have unusual weakness or numbness in your arms or legs.  You develop nausea or vomiting.  You develop abdominal pain.  You feel faint.   This information is not intended to replace advice given to you by your health care  provider. Make sure you discuss any questions you have with your health care provider.   Document Released: 10/11/2005 Document Revised: 11/01/2014 Document Reviewed: 02/12/2014 Elsevier Interactive Patient Education [employer].
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
## 2015-08-12 NOTE — ED Notes (Signed)
Pain in back and chest, increased with movement and breathing.  No other symptoms with this
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
## 2015-08-15 ENCOUNTER — Ambulatory Visit (INDEPENDENT_AMBULATORY_CARE_PROVIDER_SITE_OTHER): Payer: Self-pay | Admitting: Physician Assistant

## 2015-08-15 ENCOUNTER — Encounter: Payer: Self-pay | Admitting: Physician Assistant

## 2015-08-15 VITALS — BP 136/92 | HR 60 | Ht 68.0 in | Wt 221.2 lb

## 2015-08-15 DIAGNOSIS — Z720 Tobacco use: Secondary | ICD-10-CM

## 2015-08-15 DIAGNOSIS — I10 Essential (primary) hypertension: Secondary | ICD-10-CM

## 2015-08-15 DIAGNOSIS — E785 Hyperlipidemia, unspecified: Secondary | ICD-10-CM

## 2015-08-15 DIAGNOSIS — M546 Pain in thoracic spine: Secondary | ICD-10-CM

## 2015-08-15 DIAGNOSIS — J069 Acute upper respiratory infection, unspecified: Secondary | ICD-10-CM

## 2015-08-15 DIAGNOSIS — Z9861 Coronary angioplasty status: Secondary | ICD-10-CM

## 2015-08-15 DIAGNOSIS — I2510 Atherosclerotic heart disease of native coronary artery without angina pectoris: Secondary | ICD-10-CM

## 2015-08-15 NOTE — Patient Instructions (Addendum)
Medication Instructions:  Your physician recommends that you continue on your current medications as directed. Please refer to the Current Medication list given to you today.     Labwork: None ordered    Testing/Procedures: None ordered  Follow-Up: Your physician wants you to follow-up in: 6 MONTHS WITH DR. CRENSHAW.  You will receive a reminder letter in the mail two months in advance. If you don't receive a letter, please call our office to schedule the follow-up appointment.    Any Other Special Instructions Will Be Listed Below (If Applicable).  It is ok to try:  Dextromethorphan                        Guaifenesin                       Tylenol   AVOID:  Sudafed               Preudoephedrine               Phelyrephrine               Afrin nasal spray   Please monitor your blood pressure.  If it is running GREATER than 130/80, please call our office [phone].

## 2016-05-28 ENCOUNTER — Other Ambulatory Visit: Payer: Self-pay | Admitting: Cardiology

## 2016-05-28 DIAGNOSIS — I2510 Atherosclerotic heart disease of native coronary artery without angina pectoris: Secondary | ICD-10-CM

## 2016-05-28 DIAGNOSIS — I25118 Atherosclerotic heart disease of native coronary artery with other forms of angina pectoris: Secondary | ICD-10-CM

## 2016-05-28 NOTE — Telephone Encounter (Signed)
Rx(s) sent to pharmacy electronically.  

## 2016-05-31 ENCOUNTER — Emergency Department (HOSPITAL_COMMUNITY): Payer: Self-pay

## 2016-05-31 ENCOUNTER — Observation Stay (HOSPITAL_COMMUNITY): Payer: Self-pay

## 2016-05-31 ENCOUNTER — Encounter (HOSPITAL_COMMUNITY): Payer: Self-pay | Admitting: *Deleted

## 2016-05-31 ENCOUNTER — Observation Stay (HOSPITAL_BASED_OUTPATIENT_CLINIC_OR_DEPARTMENT_OTHER): Payer: Self-pay

## 2016-05-31 ENCOUNTER — Observation Stay (HOSPITAL_COMMUNITY)
Admission: EM | Admit: 2016-05-31 | Discharge: 2016-05-31 | Disposition: A | Discharge status: Home / Self Care | Payer: Self-pay | Attending: Internal Medicine | Admitting: Internal Medicine

## 2016-05-31 DIAGNOSIS — F17210 Nicotine dependence, cigarettes, uncomplicated: Secondary | ICD-10-CM | POA: Insufficient documentation

## 2016-05-31 DIAGNOSIS — Z955 Presence of coronary angioplasty implant and graft: Secondary | ICD-10-CM | POA: Insufficient documentation

## 2016-05-31 DIAGNOSIS — R61 Generalized hyperhidrosis: Secondary | ICD-10-CM | POA: Insufficient documentation

## 2016-05-31 DIAGNOSIS — E785 Hyperlipidemia, unspecified: Secondary | ICD-10-CM | POA: Insufficient documentation

## 2016-05-31 DIAGNOSIS — Z7982 Long term (current) use of aspirin: Secondary | ICD-10-CM | POA: Insufficient documentation

## 2016-05-31 DIAGNOSIS — R110 Nausea: Principal | ICD-10-CM | POA: Diagnosis present

## 2016-05-31 DIAGNOSIS — L409 Psoriasis, unspecified: Secondary | ICD-10-CM | POA: Insufficient documentation

## 2016-05-31 DIAGNOSIS — Z79899 Other long term (current) drug therapy: Secondary | ICD-10-CM | POA: Insufficient documentation

## 2016-05-31 DIAGNOSIS — I2510 Atherosclerotic heart disease of native coronary artery without angina pectoris: Secondary | ICD-10-CM

## 2016-05-31 DIAGNOSIS — M791 Myalgia: Secondary | ICD-10-CM | POA: Insufficient documentation

## 2016-05-31 DIAGNOSIS — I252 Old myocardial infarction: Secondary | ICD-10-CM | POA: Insufficient documentation

## 2016-05-31 DIAGNOSIS — I10 Essential (primary) hypertension: Secondary | ICD-10-CM | POA: Insufficient documentation

## 2016-05-31 DIAGNOSIS — R079 Chest pain, unspecified: Secondary | ICD-10-CM | POA: Insufficient documentation

## 2016-05-31 LAB — CBC WITH DIFFERENTIAL/PLATELET
BASOS ABS: 0 10*3/uL (ref 0.0–0.1)
BASOS ABS: 0.1 10*3/uL (ref 0.0–0.1)
Basophils Relative: 1 %
Basophils Relative: 1 %
EOS PCT: 1 %
Eosinophils Absolute: 0.2 10*3/uL (ref 0.0–0.7)
Eosinophils Absolute: 0.1 10*3/uL (ref 0.0–0.7)
Eosinophils Relative: 2 %
HEMATOCRIT: 37.5 % — AB (ref 39.0–52.0)
HEMATOCRIT: 39.2 % (ref 39.0–52.0)
HEMOGLOBIN: 13.2 g/dL (ref 13.0–17.0)
Hemoglobin: 12.8 g/dL — ABNORMAL LOW (ref 13.0–17.0)
LYMPHS ABS: 2.9 10*3/uL (ref 0.7–4.0)
LYMPHS ABS: 5.8 10*3/uL — AB (ref 0.7–4.0)
LYMPHS PCT: 47 %
LYMPHS PCT: 66 %
MCH: 29.3 pg (ref 26.0–34.0)
MCH: 29.4 pg (ref 26.0–34.0)
MCHC: 33.7 g/dL (ref 30.0–36.0)
MCHC: 34.1 g/dL (ref 30.0–36.0)
MCV: 87.1 fL (ref 78.0–100.0)
MCV: 86 fL (ref 78.0–100.0)
MONO ABS: 0.4 10*3/uL (ref 0.1–1.0)
MONOS PCT: 7 %
MONOS PCT: 7 %
Monocytes Absolute: 0.6 10*3/uL (ref 0.1–1.0)
NEUTROS ABS: 2.7 10*3/uL (ref 1.7–7.7)
Neutro Abs: 2.1 10*3/uL (ref 1.7–7.7)
Neutrophils Relative %: 24 %
Neutrophils Relative %: 44 %
PLATELETS: 211 10*3/uL (ref 150–400)
Platelets: 237 10*3/uL (ref 150–400)
RBC: 4.36 MIL/uL (ref 4.22–5.81)
RBC: 4.5 MIL/uL (ref 4.22–5.81)
RDW: 12.3 % (ref 11.5–15.5)
RDW: 12.3 % (ref 11.5–15.5)
WBC: 6.1 10*3/uL (ref 4.0–10.5)
WBC: 8.8 10*3/uL (ref 4.0–10.5)

## 2016-05-31 LAB — URINALYSIS, ROUTINE W REFLEX MICROSCOPIC
Bilirubin Urine: NEGATIVE
Glucose, UA: NEGATIVE mg/dL
Ketones, ur: NEGATIVE mg/dL
LEUKOCYTES UA: NEGATIVE
NITRITE: NEGATIVE
PH: 6 (ref 5.0–8.0)
Protein, ur: NEGATIVE mg/dL
SPECIFIC GRAVITY, URINE: 1.019 (ref 1.005–1.030)

## 2016-05-31 LAB — RAPID URINE DRUG SCREEN, HOSP PERFORMED
AMPHETAMINES: NOT DETECTED
BARBITURATES: NOT DETECTED
Benzodiazepines: NOT DETECTED
COCAINE: NOT DETECTED
OPIATES: NOT DETECTED
TETRAHYDROCANNABINOL: POSITIVE — AB

## 2016-05-31 LAB — URINE MICROSCOPIC-ADD ON

## 2016-05-31 LAB — COMPREHENSIVE METABOLIC PANEL
ALBUMIN: 3.8 g/dL (ref 3.5–5.0)
ALT: 31 U/L (ref 17–63)
AST: 29 U/L (ref 15–41)
Alkaline Phosphatase: 82 U/L (ref 38–126)
Anion gap: 6 (ref 5–15)
BILIRUBIN TOTAL: 0.4 mg/dL (ref 0.3–1.2)
BUN: 11 mg/dL (ref 6–20)
CO2: 26 mmol/L (ref 22–32)
Calcium: 8.6 mg/dL — ABNORMAL LOW (ref 8.9–10.3)
Chloride: 103 mmol/L (ref 101–111)
Creatinine, Ser: 1 mg/dL (ref 0.61–1.24)
GFR calc Af Amer: >60 mL/min (ref >60)
GFR calc non Af Amer: >60 mL/min (ref >60)
GLUCOSE: 120 mg/dL — AB (ref 65–99)
POTASSIUM: 3.5 mmol/L (ref 3.5–5.1)
SODIUM: 135 mmol/L (ref 135–145)
TOTAL PROTEIN: 6.5 g/dL (ref 6.5–8.1)

## 2016-05-31 LAB — HEPATIC FUNCTION PANEL
ALBUMIN: 3.6 g/dL (ref 3.5–5.0)
ALK PHOS: 83 U/L (ref 38–126)
ALT: 31 U/L (ref 17–63)
AST: 24 U/L (ref 15–41)
Bilirubin, Direct: <0.1 mg/dL — ABNORMAL LOW (ref 0.1–0.5)
TOTAL PROTEIN: 6.5 g/dL (ref 6.5–8.1)
Total Bilirubin: 0.5 mg/dL (ref 0.3–1.2)

## 2016-05-31 LAB — TROPONIN I
Troponin I: <0.03 ng/mL (ref <0.03)
Troponin I: <0.03 ng/mL (ref <0.03)

## 2016-05-31 LAB — GLUCOSE, CAPILLARY
Glucose-Capillary: 91 mg/dL (ref 65–99)
Glucose-Capillary: 115 mg/dL — ABNORMAL HIGH (ref 65–99)

## 2016-05-31 LAB — BASIC METABOLIC PANEL
ANION GAP: 7 (ref 5–15)
BUN: 9 mg/dL (ref 6–20)
CALCIUM: 9 mg/dL (ref 8.9–10.3)
CO2: 26 mmol/L (ref 22–32)
Chloride: 104 mmol/L (ref 101–111)
Creatinine, Ser: 0.86 mg/dL (ref 0.61–1.24)
GFR calc Af Amer: >60 mL/min (ref >60)
GFR calc non Af Amer: >60 mL/min (ref >60)
GLUCOSE: 98 mg/dL (ref 65–99)
Potassium: 3.8 mmol/L (ref 3.5–5.1)
Sodium: 137 mmol/L (ref 135–145)

## 2016-05-31 LAB — ECHOCARDIOGRAM COMPLETE
HEIGHTINCHES: 68 in
Weight: 3273.6 oz

## 2016-05-31 LAB — CK: Total CK: 172 U/L (ref 49–397)

## 2016-05-31 LAB — I-STAT TROPONIN, ED: Troponin i, poc: 0.01 ng/mL (ref 0.00–0.08)

## 2016-05-31 MED ORDER — OXYCODONE HCL 5 MG PO TABS: 5.0000 mg | ORAL_TABLET | ORAL | Status: DC | PRN

## 2016-05-31 MED ORDER — DIAZEPAM 5 MG PO TABS: 2.5000 mg | ORAL_TABLET | Freq: Four times a day (QID) | ORAL | Status: DC | PRN

## 2016-05-31 MED ORDER — ONDANSETRON HCL 4 MG/2ML IJ SOLN: 4.0000 mg | Freq: Four times a day (QID) | INTRAMUSCULAR | Status: DC | PRN

## 2016-05-31 MED ORDER — AMLODIPINE BESYLATE 10 MG PO TABS
10.0000 mg | ORAL_TABLET | Freq: Every day | ORAL | Status: DC
  Administered 2016-05-31: 10 mg via ORAL
  Filled 2016-05-31: qty 1

## 2016-05-31 MED ORDER — ASPIRIN 81 MG PO CHEW
324.0000 mg | CHEWABLE_TABLET | Freq: Once | ORAL | Status: AC
  Administered 2016-05-31: 324 mg via ORAL
  Filled 2016-05-31: qty 4

## 2016-05-31 MED ORDER — NITROGLYCERIN 0.4 MG SL SUBL: 0.4000 mg | SUBLINGUAL_TABLET | SUBLINGUAL | Status: DC | PRN

## 2016-05-31 MED ORDER — LISINOPRIL 40 MG PO TABS
40.0000 mg | ORAL_TABLET | Freq: Every day | ORAL | Status: DC
  Administered 2016-05-31: 40 mg via ORAL
  Filled 2016-05-31: qty 1

## 2016-05-31 MED ORDER — ONDANSETRON HCL 4 MG PO TABS: 4.0000 mg | ORAL_TABLET | Freq: Four times a day (QID) | ORAL | Status: DC | PRN

## 2016-05-31 MED ORDER — METOPROLOL TARTRATE 25 MG PO TABS
25.0000 mg | ORAL_TABLET | Freq: Two times a day (BID) | ORAL | Status: DC
  Administered 2016-05-31: 25 mg via ORAL
  Filled 2016-05-31: qty 1

## 2016-05-31 MED ORDER — ASPIRIN EC 81 MG PO TBEC
81.0000 mg | DELAYED_RELEASE_TABLET | Freq: Every day | ORAL | Status: DC
  Administered 2016-05-31: 81 mg via ORAL
  Filled 2016-05-31: qty 1

## 2016-05-31 MED ORDER — ATORVASTATIN CALCIUM 80 MG PO TABS
80.0000 mg | ORAL_TABLET | Freq: Every day | ORAL | Status: DC
  Administered 2016-05-31: 80 mg via ORAL
  Filled 2016-05-31: qty 1

## 2016-05-31 NOTE — ED Notes (Signed)
Pt returned from XR at this time.  Pt in NAD.  Will continue to closely monitor pt.

## 2016-05-31 NOTE — ED Provider Notes (Signed)
By signing my name below, I, Rosario Adie, attest that this documentation has been prepared under the direction and in the presence of Kristen N Ward, DO. Electronically Signed: Rosario Adie, ED Scribe. 05/31/16. 1:08 AM.  TIME SEEN:  First MD Initiated Contact with Patient 05/31/16 0101    CHIEF COMPLAINT:  Chief Complaint  Patient presents with  . Chest Pain  . Nausea   HPI: HPI Comments: Bradley Wolfe. is a 45 y.o. male with a PMHx of CAD, GERD, NSTEMI, HLD, psoriasis, and HTN, who presents to the Emergency Department complaining of gradual onset, unchanged, constant nausea x ~2 days. Pt reports associated chills, diffuse myalgias, and diarrhea since the onset of his nausea. Additionally, pt notes that he has had mild, intermittent, mid-sternal CP over the past few days described as a pressure, with associated SOB and diaphoresis. Per pt, he has felt chest pain while in the ED, but states that it as only been mild and intermittent. Pt reports that his CP is exacerbated with exertional activities. Pt reports that he does not remember his symptoms from his previous cardiac catheterization in 2014 but has a stent. Denies any recent tick exposures. No recent travel outside of the country. No recent sick contact with similar symptoms. Denies cough, emesis, dysuria, hematuria, abdominal pain,new rash, or any other associated symptoms.   PCP: Rama (Georgianne Fick) Sofie Hartigan, MD (Inactive) - states he hasn't seen this physician in over 2 years Cardiologist - [facility]  ROS: See HPI Constitutional: + chills 2 days ago Eyes: no drainage  ENT: no runny nose   Cardiovascular: chest pain  Resp: SOB  GI: no vomiting GU: no dysuria Integumentary: no rash  Allergy: no hives  Musculoskeletal: no leg swelling  Neurological: no slurred speech ROS otherwise negative  PAST MEDICAL HISTORY/PAST SURGICAL HISTORY:  Past Medical History:  Diagnosis Date  . CAD (coronary artery  disease), native coronary artery 12/21/12   a. NSTEMI 2/14 => LHC 12/21/12: mLAD 90%, OM1 20%, mRCA 100%, EF 55-60%.  PCI: Xience Xpedition DES to mRCA and Xience Xpedition DES to mLAD.   [name] GERD (gastroesophageal reflux disease)   . Hyperlipidemia   . Hypertension   . Psoriasis   . Tobacco abuse     MEDICATIONS:  Prior to Admission medications   Medication Sig Start Date End Date Taking? Authorizing Provider  amLODipine (NORVASC) 10 MG tablet Take 1 tablet (10 mg total) by mouth daily. PLEASE CONTACT OFFICE FOR ADDITIONAL REFILLS 05/28/16   Lewayne Bunting, MD  aspirin EC 81 MG EC tablet Take 1 tablet (81 mg total) by mouth daily. 12/22/12   Roger A Arguello, PA-C  atorvastatin (LIPITOR) 80 MG tablet TAKE ONE TABLET BY MOUTH ONCE DAILY 04/07/15   Lewayne Bunting, MD  diazepam (VALIUM) 5 MG tablet Take 0.5 tablets (2.5 mg total) by mouth every 6 (six) hours as needed for anxiety (spasms). 08/12/15   Melene Plan, DO  lisinopril (PRINIVIL,ZESTRIL) 40 MG tablet Take 1 tablet (40 mg total) by mouth daily. PLEASE CONTACT OFFICE FOR ADDITIONAL REFILLS 05/28/16   Lewayne Bunting, MD  metoprolol tartrate (LOPRESSOR) 25 MG tablet TAKE ONE TABLET BY MOUTH TWICE DAILY 05/01/15   Leone Brand, NP  nitroGLYCERIN (NITROSTAT) 0.4 MG SL tablet DISSOLVE ONE TABLET UNDER THE TONGUE EVERY 5 MINUTES AS NEEDED FOR CHEST PAIN.  DO NOT EXCEED A TOTAL OF 3 DOSES IN 15 MINUTES 05/01/15   Leone Brand, NP  oxyCODONE (ROXICODONE) 5 MG  immediate release tablet Take 1 tablet (5 mg total) by mouth every 4 (four) hours as needed for severe pain. 08/12/15   Melene Planan Floyd, DO    ALLERGIES:  No Known Allergies  SOCIAL HISTORY:  Social History  Substance Use Topics  . Smoking status: Light Tobacco Smoker    Packs/day: 0.25    Years: 20.00    Types: Cigarettes  . Smokeless tobacco: Never Used  . Alcohol use 0.6 oz/week    1 Standard drinks or equivalent per week    FAMILY HISTORY: Family History  Problem Relation Age of  Onset  . Heart attack Father   . Heart attack Mother     EXAM: BP 143/85 (BP Location: Left Arm)   Pulse 60   Temp 98.2 F (36.8 C) (Oral)   Resp 14   Ht 5\' 8"  (1.727 m)   Wt 221 lb (100.2 kg)   SpO2 100%   BMI 33.60 kg/m  CONSTITUTIONAL: Alert and oriented and responds appropriately to questions. Well-appearing; well-nourished HEAD: Normocephalic EYES: Conjunctivae clear, PERRL ENT: normal nose; no rhinorrhea; moist mucous membranes NECK: Supple, no meningismus, no LAD  CARD: RRR; S1 and S2 appreciated; no murmurs, no clicks, no rubs, no gallops RESP: Normal chest excursion without splinting or tachypnea; breath sounds clear and equal bilaterally; no wheezes, no rhonchi, no rales, no hypoxia or respiratory distress, speaking full sentences ABD/GI: Normal bowel sounds; non-distended; soft, non-tender, no rebound, no guarding, no peritoneal signs BACK:  The back appears normal and is non-tender to palpation, there is no CVA tenderness EXT: Normal ROM in all joints; non-tender to palpation; no edema; normal capillary refill; no cyanosis, no calf tenderness or swelling    SKIN: Normal color for age and race; warm; plaque-like lesions diffusely to his torso and extremities consistent with psoriasis with no erythema, warmth or drainage. NEURO: Moves all extremities equally, sensation to light touch intact diffusely, cranial nerves II through XII intact PSYCH: The patient's mood and manner are appropriate. Grooming and personal hygiene are appropriate.  MEDICAL DECISION MAKING: Patient here with symptoms of intermittent chest pressure, shortness of breath, diaphoresis and nausea. States he feels "run down". No sign of infection on exam and he has no fever. No leukocytosis. I'm concerned this could be his anginal equivalent. Last cardiac catheterization was in 2014 and had a stent placed. We'll obtain labs, chest x-ray. We'll give aspirin and nitroglycerin if chest pain returns. Anticipate he  will need admission for chest pain rule out.  ED PROGRESS: Patient's labs are unremarkable. Troponin negative. Chest x-ray clear. We'll discuss with medicine for admission.  2:50 AM  Discussed patient's case with hospitalist, Dr. Toniann FailKakrakandy.  Recommend admission to telemetry, observation bed.  I will place holding orders per their request. Patient and family (if present) updated with plan. Care transferred to hospitalist service.  I reviewed all nursing notes, vitals, pertinent old records, EKGs, labs, imaging (as available).    EKG Interpretation  Date/Time:  Monday May 31 2016 00:17:36 EDT Ventricular Rate:  58 PR Interval:  160 QRS Duration: 100 QT Interval:  420 QTC Calculation: 412 R Axis:   50 Text Interpretation:  Sinus bradycardia Otherwise normal ECG No significant change since last tracing Confirmed by WARD,  DO, KRISTEN [phone]) on 05/31/2016 12:55:33 AM       I personally performed the services described in this documentation, which was scribed in my presence. The recorded information has been reviewed and is accurate.       Baxter HireKristen  N Ward, DO 05/31/16 [id number]

## 2016-05-31 NOTE — ED Notes (Signed)
Patient transported to X-ray at this time.  Pt in NAD at this time.

## 2016-05-31 NOTE — H&P (Signed)
History and Physical    Bradley MaffucciAlvin G Laws Jr. ZOX:[id number] DOB: 1971/02/15 DOA: 05/31/2016  PCP: Rama (Georgianne FickAjith Ramachandran) [name], MD (Inactive)  Patient coming from: Home.  Chief Complaint: Nausea.  HPI: Bradley Maffuccilvin G Stogdill Jr. is a 45 y.o. male with CAD status post stenting, hypertension, hyperlipidemia presents to the ER because of nausea. In addition patient states 3-4 days ago patient had spells of diaphoresis. A week ago he felt that he may have sprained his back which has resolved. Denies any chest pain. Last 2 days he has been experiencing nausea which is not related to eating. Denies any abdominal pain or diarrhea. Given the patient's cardiac history patient has been admitted for further observation to rule out ACS and also to check for any GI source.   ED Course: See history of present illness.  Review of Systems: As per HPI, rest all negative.   Past Medical History:  Diagnosis Date  . CAD (coronary artery disease), native coronary artery 12/21/12   a. NSTEMI 2/14 => LHC 12/21/12: mLAD 90%, OM1 20%, mRCA 100%, EF 55-60%.  PCI: Xience Xpedition DES to mRCA and Xience Xpedition DES to mLAD.   [name]. GERD (gastroesophageal reflux disease)   . Hyperlipidemia   . Hypertension   . Psoriasis   . Tobacco abuse     Past Surgical History:  Procedure Laterality Date  . CORONARY ANGIOPLASTY WITH STENT PLACEMENT  12/21/12   90% mid LAD s/p DES, 20% OM1, 100% mid RCA s/p DES; LVEF 55-60%  . LEFT HEART CATHETERIZATION WITH CORONARY ANGIOGRAM N/A 12/21/2012   Procedure: LEFT HEART CATHETERIZATION WITH CORONARY ANGIOGRAM;  Surgeon: Kathleene Hazelhristopher D McAlhany, MD;  [facility];  Service: Cardiovascular;  Laterality: N/A;     reports that he has been smoking Cigarettes.  He has a 5.00 pack-year smoking history. He has never used smokeless tobacco. He reports that he drinks about 0.6 oz of alcohol per week . He reports that he uses drugs, including Marijuana.  No Known  Allergies  Family History  Problem Relation Age of Onset  . Heart attack Father   . Heart attack Mother     Prior to Admission medications   Medication Sig Start Date End Date Taking? Authorizing Provider  amLODipine (NORVASC) 10 MG tablet Take 1 tablet (10 mg total) by mouth daily. PLEASE CONTACT OFFICE FOR ADDITIONAL REFILLS 05/28/16   Lewayne BuntingBrian S Crenshaw, MD  aspirin EC 81 MG EC tablet Take 1 tablet (81 mg total) by mouth daily. 12/22/12   Roger A Arguello, PA-C  atorvastatin (LIPITOR) 80 MG tablet TAKE ONE TABLET BY MOUTH ONCE DAILY 04/07/15   Lewayne BuntingBrian S Crenshaw, MD  diazepam (VALIUM) 5 MG tablet Take 0.5 tablets (2.5 mg total) by mouth every 6 (six) hours as needed for anxiety (spasms). 08/12/15   Melene Planan Floyd, DO  lisinopril (PRINIVIL,ZESTRIL) 40 MG tablet Take 1 tablet (40 mg total) by mouth daily. PLEASE CONTACT OFFICE FOR ADDITIONAL REFILLS 05/28/16   Lewayne BuntingBrian S Crenshaw, MD  metoprolol tartrate (LOPRESSOR) 25 MG tablet TAKE ONE TABLET BY MOUTH TWICE DAILY 05/01/15   Leone BrandLaura R Ingold, NP  nitroGLYCERIN (NITROSTAT) 0.4 MG SL tablet DISSOLVE ONE TABLET UNDER THE TONGUE EVERY 5 MINUTES AS NEEDED FOR CHEST PAIN.  DO NOT EXCEED A TOTAL OF 3 DOSES IN 15 MINUTES 05/01/15   Leone BrandLaura R Ingold, NP  oxyCODONE (ROXICODONE) 5 MG immediate release tablet Take 1 tablet (5 mg total) by mouth every 4 (four) hours as needed for severe pain. 08/12/15  Melene Plan, DO    Physical Exam: Vitals:   05/31/16 0230 05/31/16 0245 05/31/16 0300 05/31/16 0359  BP: 125/71 126/76 133/84 (!) 162/80  Pulse: (!) 55 (!) 52 (!) 57 (!) 56  Resp: 17 17 18 18   Temp:    97.9 F (36.6 C)  TempSrc:    Oral  SpO2: 93% 95% 95% 100%  Weight:    204 lb 9.6 oz (92.8 kg)  Height:    5\' 8"  (1.727 m)      Constitutional: Not in distress. Vitals:   05/31/16 0230 05/31/16 0245 05/31/16 0300 05/31/16 0359  BP: 125/71 126/76 133/84 (!) 162/80  Pulse: (!) 55 (!) 52 (!) 57 (!) 56  Resp: 17 17 18 18   Temp:    97.9 F (36.6 C)  TempSrc:    Oral   SpO2: 93% 95% 95% 100%  Weight:    204 lb 9.6 oz (92.8 kg)  Height:    5\' 8"  (1.727 m)   Eyes: Anicteric no pallor. ENMT: No discharge from the ears eyes nose and mouth. Neck: No mass felt. No JVD appreciated. Respiratory: No rhonchi or crepitations. Cardiovascular: S1 and S2 heard. Abdomen: Soft nontender bowel sounds present. Musculoskeletal: No edema. Skin: Has chronic skin changes from psoriasis. Neurologic: Alert awake oriented to time place and person. Moves all extremities. Psychiatric: Appears normal.   Labs on Admission: I have personally reviewed following labs and imaging studies  CBC:  Recent Labs Lab 05/31/16 0034  WBC 8.8  NEUTROABS 2.1  HGB 13.2  HCT 39.2  MCV 87.1  PLT 237   Basic Metabolic Panel:  Recent Labs Lab 05/31/16 0034  NA 135  K 3.5  CL 103  CO2 26  GLUCOSE 120*  BUN 11  CREATININE 1.00  CALCIUM 8.6*   GFR: Estimated Creatinine Clearance: 103.2 mL/min (by C-G formula based on SCr of 1 mg/dL). Liver Function Tests:  Recent Labs Lab 05/31/16 0034  AST 29  ALT 31  ALKPHOS 82  BILITOT 0.4  PROT 6.5  ALBUMIN 3.8   No results for input(s): LIPASE, AMYLASE in the last 168 hours. No results for input(s): AMMONIA in the last 168 hours. Coagulation Profile: No results for input(s): INR, PROTIME in the last 168 hours. Cardiac Enzymes: No results for input(s): CKTOTAL, CKMB, CKMBINDEX, TROPONINI in the last 168 hours. BNP (last 3 results) No results for input(s): PROBNP in the last 8760 hours. HbA1C: No results for input(s): HGBA1C in the last 72 hours. CBG: No results for input(s): GLUCAP in the last 168 hours. Lipid Profile: No results for input(s): CHOL, HDL, LDLCALC, TRIG, CHOLHDL, LDLDIRECT in the last 72 hours. Thyroid Function Tests: No results for input(s): TSH, T4TOTAL, FREET4, T3FREE, THYROIDAB in the last 72 hours. Anemia Panel: No results for input(s): VITAMINB12, FOLATE, FERRITIN, TIBC, IRON, RETICCTPCT in the  last 72 hours. Urine analysis:    Component Value Date/Time   COLORURINE YELLOW 05/31/2016 0035   APPEARANCEUR CLEAR 05/31/2016 0035   LABSPEC 1.019 05/31/2016 0035   PHURINE 6.0 05/31/2016 0035   GLUCOSEU NEGATIVE 05/31/2016 0035   HGBUR SMALL (A) 05/31/2016 0035   BILIRUBINUR NEGATIVE 05/31/2016 0035   KETONESUR NEGATIVE 05/31/2016 0035   PROTEINUR NEGATIVE 05/31/2016 0035   UROBILINOGEN 0.2 05/12/2011 0105   NITRITE NEGATIVE 05/31/2016 0035   LEUKOCYTESUR NEGATIVE 05/31/2016 0035   Sepsis Labs: @LABRCNTIP (procalcitonin:4,lacticidven:4) )No results found for this or any previous visit (from the past 240 hour(s)).   Radiological Exams on Admission: Dg Chest 2 View  Result Date: 05/31/2016 CLINICAL DATA:  Chest pain. EXAM: CHEST  2 VIEW COMPARISON:  12/21/2012 FINDINGS: Low lung volumes with crowding of bronchovascular tree are. The heart size is normal. No pulmonary edema, pleural effusion, focal airspace disease or pneumothorax. No acute osseous abnormality is seen. IMPRESSION: No active cardiopulmonary disease. Electronically Signed   By: Rubye Oaks M.D.   On: 05/31/2016 01:08    EKG: Independently reviewed. Sinus bradycardia and beats around 57 bpm.  Assessment/Plan Principal Problem:   Nausea without vomiting Active Problems:   Hypertension   CAD (coronary artery disease), native coronary artery   Nausea    1. Nausea and diaphoresis with history of CAD status post stenting - will cycle cardiac markers to rule out ACS. Check sonogram of the abdomen and if it shows any gallstones will need further workup for this. Continue aspirin and metoprolol and statins. 2. Hypertension - on lisinopril and amlodipine and metoprolol. 3. Hyperlipidemia on statins.   DVT prophylaxis: SCDs pending sonogram results. Code Status: Full code.  Family Communication: No family at the bedside.  Disposition Plan: Home.  Consults called: None.  Admission status: Observation. Telemetry.     Eduard Clos MD [facility] Pager [phone].  If 7PM-7AM, please contact night-coverage [email or web address] Password [email or web address]  05/31/2016, 4:31 AM

## 2016-05-31 NOTE — ED Triage Notes (Signed)
Patient presents stating he started feeling bad 2-3 days ago thinking he might have the flu.  Has continued with feeling bad.  Stated CP comes and goes

## 2016-05-31 NOTE — Progress Notes (Signed)
PROGRESS NOTE  Bradley MaffucciAlvin G Wolfe Jr. ZOX:[id number] DOB: [birth year] DOA: 05/31/2016 PCP: [name]([name]) [name], MD (Inactive)  Brief History:  45 year old male with history of CAD status post PCI, hypertension, hyperlipidemia presented with 2 to three-day history of nausea, Subjective fevers and chills, and diaphoresis. Apparently, the patient felt strained his back a week ago, but upon further questioning, he felt achy all over with fatigue. The patient had NSTEMI  Feb 2014 and he underwent left HEART catheterization 12/21/2012 with placement of DES to the LAD and RCA. He endorses compliance with all his medications. Apparently, there was concern given the patient's present symptoms and cardiac history. The patient was admitted for further rule out of ACS. The patient denied any chest discomfort or shortness of breath or worsening dyspnea on exertion. There is no dizziness or syncope.  Medical records reviewed and summarized  Assessment/Plan: Nausea and diaphoresis -I feel that the patient's constellation of symptoms is more consistent with a recent viral type syndrome and less likely acute coronary syndrome -Finished cycle troponins -order Echocardiogram -05/31/2016 abdominal ultrasound--negative -05/30/2016 EKG--sinus rhythm, nonspecific ST changes -personally reviewedChest x-ray negative acute findings--chronic bronchitic markings -05/05/2015 Myoview--low risk; EF 49%  Myalgia -check CPK on lipitor  CAD -no chest pain -EKG--sinus rhythm, nonspecific ST changes -continue ASA, statin  HTN -continue amlodipine, lisinopril and metoprolol tartrate    Disposition Plan:   Home 8/8 if stable Family Communication:   NoFamily at bedside  Consultants:  none  Code Status:  FULL  DVT Prophylaxis:  [place] Lovenox   Procedures: As Listed in Progress Note Above  Antibiotics: None    Subjective: Patient denies fevers, chills, headache, chest pain, dyspnea,  nausea, vomiting, diarrhea, abdominal pain, dysuria, hematuria, hematochezia, and melena.   Objective: Vitals:   05/31/16 0230 05/31/16 0245 05/31/16 0300 05/31/16 0359  BP: 125/71 126/76 133/84 (!) 162/80  Pulse: (!) 55 (!) 52 (!) 57 (!) 56  Resp: 17 17 18 18   Temp:    97.9 F (36.6 C)  TempSrc:    Oral  SpO2: 93% 95% 95% 100%  Weight:    92.8 kg (204 lb 9.6 oz)  Height:    5\' 8"  (1.727 m)    Intake/Output Summary (Last 24 hours) at 05/31/16 1030 Last data filed at 05/31/16 0934  Gross per 24 hour  Intake                0 ml  Output              850 ml  Net             -850 ml   Weight change:  Exam:   General:  Pt is alert, follows commands appropriately, not in acute distress  HEENT: No icterus, No thrush, No neck mass, [place]/AT  Cardiovascular: RRR, S1/S2, no rubs, no gallops  Respiratory: CTA bilaterally, no wheezing, no crackles, no rhonchi  Abdomen: Soft/+BS, non tender, non distended, no guarding  Extremities: No edema, No lymphangitis, No petechiae, No rashes, no synovitis   Data Reviewed: I have personally reviewed following labs and imaging studies Basic Metabolic Panel:  Recent Labs Lab 05/31/16 0034 05/31/16 0532  NA 135 137  K 3.5 3.8  CL 103 104  CO2 26 26  GLUCOSE 120* 98  BUN 11 9  CREATININE 1.00 0.86  CALCIUM 8.6* 9.0   Liver Function Tests:  Recent Labs Lab 05/31/16 0034 05/31/16 0532  AST 29  24  ALT 31 31  ALKPHOS 82 83  BILITOT 0.4 0.5  PROT 6.5 6.5  ALBUMIN 3.8 3.6   No results for input(s): LIPASE, AMYLASE in the last 168 hours. No results for input(s): AMMONIA in the last 168 hours. Coagulation Profile: No results for input(s): INR, PROTIME in the last 168 hours. CBC:  Recent Labs Lab 05/31/16 0034 05/31/16 0532  WBC 8.8 6.1  NEUTROABS 2.1 2.7  HGB 13.2 12.8*  HCT 39.2 37.5*  MCV 87.1 86.0  PLT 237 211   Cardiac Enzymes:  Recent Labs Lab 05/31/16 0532  TROPONINI <0.03   BNP: Invalid input(s):  POCBNP CBG:  Recent Labs Lab 05/31/16 0810  GLUCAP 91   HbA1C: No results for input(s): HGBA1C in the last 72 hours. Urine analysis:    Component Value Date/Time   COLORURINE YELLOW 05/31/2016 0035   APPEARANCEUR CLEAR 05/31/2016 0035   LABSPEC 1.019 05/31/2016 0035   PHURINE 6.0 05/31/2016 0035   GLUCOSEU NEGATIVE 05/31/2016 0035   HGBUR SMALL (A) 05/31/2016 0035   BILIRUBINUR NEGATIVE 05/31/2016 0035   KETONESUR NEGATIVE 05/31/2016 0035   PROTEINUR NEGATIVE 05/31/2016 0035   UROBILINOGEN 0.2 05/12/2011 0105   NITRITE NEGATIVE 05/31/2016 0035   LEUKOCYTESUR NEGATIVE 05/31/2016 0035   Sepsis Labs: (procalcitonin:4,lacticidven:4) )No results found for this or any previous visit (from the past 240 hour(s)).   Scheduled Meds: . amLODipine  10 mg Oral Daily  . aspirin EC  81 mg Oral Daily  . atorvastatin  80 mg Oral q1800  . lisinopril  40 mg Oral Daily  . metoprolol tartrate  25 mg Oral BID   Continuous Infusions:   Procedures/Studies: Dg Chest 2 View  Result Date: 05/31/2016 CLINICAL DATA:  Chest pain. EXAM: CHEST  2 VIEW COMPARISON:  12/21/2012 FINDINGS: Low lung volumes with crowding of bronchovascular tree are. The heart size is normal. No pulmonary edema, pleural effusion, focal airspace disease or pneumothorax. No acute osseous abnormality is seen. IMPRESSION: No active cardiopulmonary disease. Electronically Signed   By: Rubye Oaks M.D.   On: 05/31/2016 01:08   US Abdomen Complete  Result Date: 05/31/2016 CLINICAL DATA:  Acute onset of nausea and intermittent generalized chest pain. Initial encounter. EXAM: ABDOMEN ULTRASOUND COMPLETE COMPARISON:  CT of the abdomen and pelvis from 05/12/2011 FINDINGS: Gallbladder: No gallstones or wall thickening visualized. No sonographic Murphy sign noted by sonographer. Common bile duct: Diameter: 0.2 cm, within normal limits in caliber. Liver: No focal lesion identified. Within normal limits in parenchymal  echogenicity. IVC: No abnormality visualized. Pancreas: Visualized portion unremarkable. Spleen: Size and appearance within normal limits. Right Kidney: Length: 9.2 cm. Echogenicity within normal limits. No mass or hydronephrosis visualized. Left Kidney: Length: 9.9 cm. Echogenicity within normal limits. No mass or hydronephrosis visualized. Abdominal aorta: No aneurysm visualized. Difficult to fully characterize due to overlying bowel gas. Other findings: None. IMPRESSION: Unremarkable abdominal ultrasound. Electronically Signed   By: Roanna Raider M.D.   On: 05/31/2016 05:35    Analisia Kingsford, DO  [facility] Pager [phone]  If 7PM-7AM, please contact night-coverage [email or web address] Password [email or web address] 05/31/2016, 10:30 AM   LOS: 0 days

## 2016-05-31 NOTE — ED Notes (Signed)
Attempted to call report at this time 

## 2016-05-31 NOTE — Progress Notes (Signed)
  Echocardiogram 2D Echocardiogram has been performed.  Arvil ChacoFoster, Varshini Arrants 05/31/2016, 2:33 PM

## 2016-05-31 NOTE — Progress Notes (Signed)
Patient given discharge instructions and all questions answered.  Patient discharged via wheelchair with all belongings.   

## 2016-05-31 NOTE — Discharge Summary (Signed)
Physician Discharge Summary  Bradley MaffucciAlvin G Brun Jr. ZOX:[id number] DOB: [birth year] DOA: 05/31/2016  PCP: Bradley Wolfe(Bradley Wolfe) Bradley Hartiganlarkra, MD (Inactive)  Admit date: 05/31/2016 Discharge date: 05/31/2016  Admitted From: Home Disposition:  Home  Recommendations for Outpatient Follow-up:  1. Follow up with PCP in 1-2 weeks   Home Health:No Equipment/Devices:None  Discharge Condition: Stable CODE STATUS: FULL Diet recommendation: Heart Healthy   Brief/Interim Summary: 45 year old male with history of CAD status post PCI, hypertension, hyperlipidemia presented with 2 to three-day history of nausea, Subjective fevers and chills, and diaphoresis. Apparently, the patient felt strained his back a week ago, but upon further questioning, he felt achy all over with fatigue. The patient had NSTEMI  Feb 2014 and he underwent left HEART catheterization 12/21/2012 with placement of DES to the LAD and RCA. He endorses compliance with all his medications. Apparently, there was concern given the patient's present symptoms and cardiac history. The patient was admitted for further rule out of ACS. The patient denied any chest discomfort or shortness of breath or worsening dyspnea on exertion. There is no dizziness or syncope.   Discharge Diagnoses:  Nausea and diaphoresis -I feel that the patient's constellation of symptoms is more consistent with a recent viral type syndrome and less likely acute coronary syndrome -Finished cycle troponins--neg x 3 -order Echocardiogram--EF 55-60 percent, no WMA, trace TR -05/31/2016 abdominal ultrasound--negative -05/30/2016 EKG--sinus rhythm, nonspecific ST changes -personally reviewedChest x-ray negative acute findings--chronic bronchitic markings -05/05/2015 Myoview--low risk; EF 49%  Myalgia -check CPK on lipitor--172  CAD -no chest pain -EKG--sinus rhythm, nonspecific ST changes -continue ASA, statin  HTN -continue amlodipine, lisinopril and metoprolol  tartrate -controlled during the hospitalization    Discharge Instructions  Discharge Instructions    Diet - low sodium heart healthy    Complete by:  As directed   Increase activity slowly    Complete by:  As directed       Medication List    TAKE these medications   amLODipine 10 MG tablet Commonly known as:  NORVASC Take 1 tablet (10 mg total) by mouth daily. PLEASE CONTACT OFFICE FOR ADDITIONAL REFILLS   aspirin 81 MG EC tablet Take 1 tablet (81 mg total) by mouth daily.   atorvastatin 80 MG tablet Commonly known as:  LIPITOR TAKE ONE TABLET BY MOUTH ONCE DAILY What changed:  how much to take  how to take this  when to take this  additional instructions   lisinopril 40 MG tablet Commonly known as:  PRINIVIL,ZESTRIL Take 1 tablet (40 mg total) by mouth daily. PLEASE CONTACT OFFICE FOR ADDITIONAL REFILLS   metoprolol tartrate 25 MG tablet Commonly known as:  LOPRESSOR TAKE ONE TABLET BY MOUTH TWICE DAILY What changed:  how much to take  how to take this  when to take this  additional instructions   nitroGLYCERIN 0.4 MG SL tablet Commonly known as:  NITROSTAT DISSOLVE ONE TABLET UNDER THE TONGUE EVERY 5 MINUTES AS NEEDED FOR CHEST PAIN.  DO NOT EXCEED A TOTAL OF 3 DOSES IN 15 MINUTES   PSORIASIN EX Apply 1 application topically as needed (for psoriasis).       No Known Allergies  Consultations:  none   Procedures/Studies: Dg Chest 2 View  Result Date: 05/31/2016 CLINICAL DATA:  Chest pain. EXAM: CHEST  2 VIEW COMPARISON:  12/21/2012 FINDINGS: Low lung volumes with crowding of bronchovascular tree are. The heart size is normal. No pulmonary edema, pleural effusion, focal airspace disease or pneumothorax. No acute osseous abnormality is  seen. IMPRESSION: No active cardiopulmonary disease. Electronically Signed   By: Rubye Oaks M.D.   On: 05/31/2016 01:08   US Abdomen Complete  Result Date: 05/31/2016 CLINICAL DATA:  Acute onset of nausea  and intermittent generalized chest pain. Initial encounter. EXAM: ABDOMEN ULTRASOUND COMPLETE COMPARISON:  CT of the abdomen and pelvis from 05/12/2011 FINDINGS: Gallbladder: No gallstones or wall thickening visualized. No sonographic Murphy sign noted by sonographer. Common bile duct: Diameter: 0.2 cm, within normal limits in caliber. Liver: No focal lesion identified. Within normal limits in parenchymal echogenicity. IVC: No abnormality visualized. Pancreas: Visualized portion unremarkable. Spleen: Size and appearance within normal limits. Right Kidney: Length: 9.2 cm. Echogenicity within normal limits. No mass or hydronephrosis visualized. Left Kidney: Length: 9.9 cm. Echogenicity within normal limits. No mass or hydronephrosis visualized. Abdominal aorta: No aneurysm visualized. Difficult to fully characterize due to overlying bowel gas. Other findings: None. IMPRESSION: Unremarkable abdominal ultrasound. Electronically Signed   By: Roanna Raider M.D.   On: 05/31/2016 05:35         Discharge Exam: Vitals:   05/31/16 0359 05/31/16 1200  BP: (!) 162/80 136/78  Pulse: (!) 56 (!) 49  Resp: 18 16  Temp: 97.9 F (36.6 C) 97.9 F (36.6 C)   Vitals:   05/31/16 0245 05/31/16 0300 05/31/16 0359 05/31/16 1200  BP: 126/76 133/84 (!) 162/80 136/78  Pulse: (!) 52 (!) 57 (!) 56 (!) 49  Resp: 17 18 18 16   Temp:   97.9 F (36.6 C) 97.9 F (36.6 C)  TempSrc:   Oral Oral  SpO2: 95% 95% 100% 99%  Weight:   92.8 kg (204 lb 9.6 oz)   Height:   5\' 8"  (1.727 m)     General: Pt is alert, awake, not in acute distress Cardiovascular: RRR, S1/S2 +, no rubs, no gallops Respiratory: CTA bilaterally, no wheezing, no rhonchi Abdominal: Soft, NT, ND, bowel sounds + Extremities: no edema, no cyanosis   The results of significant diagnostics from this hospitalization (including imaging, microbiology, ancillary and laboratory) are listed below for reference.    Significant Diagnostic Studies: Dg Chest 2  View  Result Date: 05/31/2016 CLINICAL DATA:  Chest pain. EXAM: CHEST  2 VIEW COMPARISON:  12/21/2012 FINDINGS: Low lung volumes with crowding of bronchovascular tree are. The heart size is normal. No pulmonary edema, pleural effusion, focal airspace disease or pneumothorax. No acute osseous abnormality is seen. IMPRESSION: No active cardiopulmonary disease. Electronically Signed   By: Rubye Oaks M.D.   On: 05/31/2016 01:08   US Abdomen Complete  Result Date: 05/31/2016 CLINICAL DATA:  Acute onset of nausea and intermittent generalized chest pain. Initial encounter. EXAM: ABDOMEN ULTRASOUND COMPLETE COMPARISON:  CT of the abdomen and pelvis from 05/12/2011 FINDINGS: Gallbladder: No gallstones or wall thickening visualized. No sonographic Murphy sign noted by sonographer. Common bile duct: Diameter: 0.2 cm, within normal limits in caliber. Liver: No focal lesion identified. Within normal limits in parenchymal echogenicity. IVC: No abnormality visualized. Pancreas: Visualized portion unremarkable. Spleen: Size and appearance within normal limits. Right Kidney: Length: 9.2 cm. Echogenicity within normal limits. No mass or hydronephrosis visualized. Left Kidney: Length: 9.9 cm. Echogenicity within normal limits. No mass or hydronephrosis visualized. Abdominal aorta: No aneurysm visualized. Difficult to fully characterize due to overlying bowel gas. Other findings: None. IMPRESSION: Unremarkable abdominal ultrasound. Electronically Signed   By: Roanna Raider M.D.   On: 05/31/2016 05:35     Microbiology: No results found for this or any previous visit (from  the past 240 hour(s)).   Labs: Basic Metabolic Panel:  Recent Labs Lab 05/31/16 0034 05/31/16 0532  NA 135 137  K 3.5 3.8  CL 103 104  CO2 26 26  GLUCOSE 120* 98  BUN 11 9  CREATININE 1.00 0.86  CALCIUM 8.6* 9.0   Liver Function Tests:  Recent Labs Lab 05/31/16 0034 05/31/16 0532  AST 29 24  ALT 31 31  ALKPHOS 82 83  BILITOT  0.4 0.5  PROT 6.5 6.5  ALBUMIN 3.8 3.6   No results for input(s): LIPASE, AMYLASE in the last 168 hours. No results for input(s): AMMONIA in the last 168 hours. CBC:  Recent Labs Lab 05/31/16 0034 05/31/16 0532  WBC 8.8 6.1  NEUTROABS 2.1 2.7  HGB 13.2 12.8*  HCT 39.2 37.5*  MCV 87.1 86.0  PLT 237 211   Cardiac Enzymes:  Recent Labs Lab 05/31/16 0532 05/31/16 1133 05/31/16 1640  CKTOTAL  --  172  --   TROPONINI <0.03 <0.03 <0.03   BNP: Invalid input(s): POCBNP CBG:  Recent Labs Lab 05/31/16 0810 05/31/16 1638  GLUCAP 91 115*    Time coordinating discharge:  Greater than 30 minutes  Signed:  Rylan Bernard, DO [facility] Pager: [phone] 05/31/2016, 5:52 PM

## 2016-06-07 ENCOUNTER — Telehealth: Payer: Self-pay | Admitting: Cardiology

## 2016-06-07 DIAGNOSIS — I25118 Atherosclerotic heart disease of native coronary artery with other forms of angina pectoris: Secondary | ICD-10-CM

## 2016-06-07 DIAGNOSIS — I2510 Atherosclerotic heart disease of native coronary artery without angina pectoris: Secondary | ICD-10-CM

## 2016-06-07 MED ORDER — AMLODIPINE BESYLATE 10 MG PO TABS: 10.0000 mg | ORAL_TABLET | Freq: Every day | ORAL | 0 refills | Status: DC

## 2016-06-07 MED ORDER — LISINOPRIL 40 MG PO TABS: 40.0000 mg | ORAL_TABLET | Freq: Every day | ORAL | 0 refills | Status: DC

## 2016-06-07 NOTE — Telephone Encounter (Signed)
New message        *STAT* If patient is at the pharmacy, call can be transferred to refill team.   1. Which medications need to be refilled? (please list name of each medication and dose if known) lisinpril 40mg ,amlodipine 10mg   2. Which pharmacy/location (including street and city if local pharmacy) is medication to be sent to? [email or web address]  3. Do they need a 30 day or 90 day supply? 30 day---pt has an appt on 8-28

## 2016-06-07 NOTE — Telephone Encounter (Signed)
Rx(s) sent to pharmacy electronically.  

## 2016-06-21 ENCOUNTER — Ambulatory Visit (INDEPENDENT_AMBULATORY_CARE_PROVIDER_SITE_OTHER): Payer: Self-pay | Admitting: Physician Assistant

## 2016-06-21 ENCOUNTER — Encounter: Payer: Self-pay | Admitting: Physician Assistant

## 2016-06-21 VITALS — BP 134/70 | HR 64 | Ht 68.0 in | Wt 206.6 lb

## 2016-06-21 DIAGNOSIS — I25118 Atherosclerotic heart disease of native coronary artery with other forms of angina pectoris: Secondary | ICD-10-CM

## 2016-06-21 DIAGNOSIS — E785 Hyperlipidemia, unspecified: Secondary | ICD-10-CM

## 2016-06-21 DIAGNOSIS — Z720 Tobacco use: Secondary | ICD-10-CM

## 2016-06-21 DIAGNOSIS — I10 Essential (primary) hypertension: Secondary | ICD-10-CM

## 2016-06-21 DIAGNOSIS — R0789 Other chest pain: Secondary | ICD-10-CM

## 2016-06-21 MED ORDER — LISINOPRIL 40 MG PO TABS: 40.0000 mg | ORAL_TABLET | Freq: Every day | ORAL | 6 refills | Status: DC

## 2016-06-21 MED ORDER — ATORVASTATIN CALCIUM 80 MG PO TABS: ORAL_TABLET | ORAL | 6 refills | Status: DC

## 2016-06-21 MED ORDER — METOPROLOL TARTRATE 25 MG PO TABS: ORAL_TABLET | ORAL | 6 refills | Status: DC

## 2016-06-21 MED ORDER — ASPIRIN 81 MG PO TBEC: 81.0000 mg | DELAYED_RELEASE_TABLET | Freq: Every day | ORAL | 6 refills | Status: DC

## 2016-06-21 MED ORDER — AMLODIPINE BESYLATE 10 MG PO TABS: 10.0000 mg | ORAL_TABLET | Freq: Every day | ORAL | 6 refills | Status: DC

## 2016-06-21 NOTE — Progress Notes (Signed)
Cardiology Office Note    Date:  06/21/2016   ID:  Bradley Maffucci., DOB 07-Sep-1971, MRN [id number]  PCP:  [name]) Sofie Hartigan, MD (Inactive)  Cardiologist:  Dr. Jens Som  Chief Complaint  Patient presents with  . Hospitalization Follow-up    seen for Dr. Jens Som  pt c/o occasional random dizziness, occasional SOB on exertion, cramps in fingers/legs/feet  . Medication Problem    pt is not sure what he takes; girlfriend does his medicines.     History of Present Illness:  Bradley Hinnant. is a 45 y.o. male with PMH of CAD (NSTEMI 11/2012 s/p DES to [facility] and DES to mLAD, EF 55-60%), HTN, tobacco abuse, GERD, HLD, Psoriasis. He had a mildly on 05/05/2015 which showed EF 49%, low risk study, moderate sized, medium intensity, partially reversible inferior defect consistent with diaphragmatic attenuation versus small prior infarct, very mild inferior ischemia. Medical therapy was continued. His last visit in cardiology office was in October 2016 at which time she was doing well, he did have atypical back pain management by muscle relaxer. He was supposed to follow-up in April, however did not follow up until now.   He was recently admitted to the hospital on 05/31/2016, he arrived shortly after midnight and discharged by afternoon. He had gradual onset, unchanged, constant nausea for 2 days. The symptom was associated with chills, myalgia and his diarrhea. He also had intermittent midsternal chest pain as well. He was admitted to internal medicine service. Serial troponin was negative 3. Abdominal ultrasound did not reveal acute process. Urine drug test was positive for marijuana. Echocardiogram obtained on 05/31/2016 showed EF 55-60%, normal wall motion, trace TR. It was felt that his constellation of symptoms is more consistent with recent viral type syndrome than ACS. He was subsequently released from the hospital.  He presents today for cardiology office visit. Since his  discharge, he still has occasional right-sided chest discomfort. But he says it is not associated with exertion. He does occasionally work by [employer]. However he has not had a fixed job for the past 14 years. As result, he says he could barely pay for his current medication even though all of his meds are generic. He says his nausea is still going on, however has improved. He says he has not smoked for 4 month.    Past Medical History:  Diagnosis Date  . CAD (coronary artery disease), native coronary artery 12/21/12   a. NSTEMI 2/14 => LHC 12/21/12: mLAD 90%, OM1 20%, mRCA 100%, EF 55-60%.  PCI: Xience Xpedition DES to mRCA and Xience Xpedition DES to mLAD.   [name] GERD (gastroesophageal reflux disease)   . Hyperlipidemia   . Hypertension   . Psoriasis   . Tobacco abuse     Past Surgical History:  Procedure Laterality Date  . CORONARY ANGIOPLASTY WITH STENT PLACEMENT  12/21/12   90% mid LAD s/p DES, 20% OM1, 100% mid RCA s/p DES; LVEF 55-60%  . LEFT HEART CATHETERIZATION WITH CORONARY ANGIOGRAM N/A 12/21/2012   Procedure: LEFT HEART CATHETERIZATION WITH CORONARY ANGIOGRAM;  Surgeon: Kathleene Hazel, MD;  Location: [facility] CATH LAB;  Service: Cardiovascular;  Laterality: N/A;    Current Medications: Outpatient Medications Prior to Visit  Medication Sig Dispense Refill  . Coal Tar Extract (PSORIASIN EX) Apply 1 application topically as needed (for psoriasis).    . nitroGLYCERIN (NITROSTAT) 0.4 MG SL tablet DISSOLVE ONE TABLET UNDER THE TONGUE EVERY 5 MINUTES AS NEEDED FOR CHEST  PAIN.  DO NOT EXCEED A TOTAL OF 3 DOSES IN 15 MINUTES 25 tablet 5  . amLODipine (NORVASC) 10 MG tablet Take 1 tablet (10 mg total) by mouth daily. 30 tablet 0  . aspirin EC 81 MG EC tablet Take 1 tablet (81 mg total) by mouth daily.    [name] atorvastatin (LIPITOR) 80 MG tablet TAKE ONE TABLET BY MOUTH ONCE DAILY (Patient taking differently: Take 80 mg by mouth daily. ) 30 tablet 6  . lisinopril (PRINIVIL,ZESTRIL) 40  MG tablet Take 1 tablet (40 mg total) by mouth daily. 30 tablet 0  . metoprolol tartrate (LOPRESSOR) 25 MG tablet TAKE ONE TABLET BY MOUTH TWICE DAILY (Patient taking differently: Take 25 mg by mouth 2 (two) times daily. ) 60 tablet 11   No facility-administered medications prior to visit.      Allergies:   Review of patient's allergies indicates no known allergies.   Social History   Social History  . Marital status: Single    Spouse name: N/A  . Number of children: N/A  . Years of education: N/A   Social History Main Topics  . Smoking status: Former Smoker    Packs/day: 0.25    Years: 20.00    Types: Cigarettes    Quit date: 02/20/2016  . Smokeless tobacco: Never Used  . Alcohol use 0.6 oz/week    1 Standard drinks or equivalent per week  . Drug use:     Types: Marijuana  . Sexual activity: Not Asked   Other Topics Concern  . None   Social History Narrative  . None     Family History:  The patient's family history includes Heart attack in his father and mother.   ROS:   Please see the history of present illness.    ROS All other systems reviewed and are negative.   PHYSICAL EXAM:   VS:  BP 134/70 (BP Location: Left Arm, Patient Position: Sitting, Cuff Size: Normal)   Pulse 64   Ht 5\' 8"  (1.727 m)   Wt 206 lb 9.6 oz (93.7 kg)   BMI 31.41 kg/m    GEN: Well nourished, well developed, in no acute distress  HEENT: normal  Neck: no JVD, carotid bruits, or masses Cardiac: RRR; no murmurs, rubs, or gallops,no edema  Respiratory:  clear to auscultation bilaterally, normal work of breathing GI: soft, nontender, nondistended, + BS MS: no deformity or atrophy  Skin: warm and dry, no rash Neuro:  Alert and Oriented x 3, Strength and sensation are intact Psych: euthymic mood, full affect  Wt Readings from Last 3 Encounters:  06/21/16 206 lb 9.6 oz (93.7 kg)  05/31/16 204 lb 9.6 oz (92.8 kg)  08/15/15 221 lb 3.2 oz (100.3 kg)      Studies/Labs Reviewed:   EKG:   EKG is not ordered today.    Recent Labs: 05/31/2016: ALT 31; BUN 9; Creatinine, Ser 0.86; Hemoglobin 12.8; Platelets 211; Potassium 3.8; Sodium 137   Lipid Panel    Component Value Date/Time   CHOL 128 05/15/2015 1102   TRIG 59.0 05/15/2015 1102   HDL 52.60 05/15/2015 1102   CHOLHDL 2 05/15/2015 1102   VLDL 11.8 05/15/2015 1102   LDLCALC 64 05/15/2015 1102    Additional studies/ records that were reviewed today include:   Myoview 05/05/2015 Study Highlights    Nuclear stress EF: 49%.  There was no ST segment deviation noted during stress.  This is a low risk study.  The left ventricular ejection fraction  is mildly decreased (45-54%).   Low risk stress nuclear study with a moderate size, medium intensity, partially reversible inferior defect consistent with diaphragmatic attenuation vs small prior infarct; very mild inferior ischemia; EF 49 with global hypokinesis.    Abdomen U/S 05/31/2016 FINDINGS: Gallbladder: No gallstones or wall thickening visualized. No sonographic Murphy sign noted by sonographer.  Common bile duct: Diameter: 0.2 cm, within normal limits in caliber.  Liver: No focal lesion identified. Within normal limits in parenchymal echogenicity.  IVC: No abnormality visualized.  Pancreas: Visualized portion unremarkable.  Spleen: Size and appearance within normal limits.  Right Kidney: Length: 9.2 cm. Echogenicity within normal limits. No mass or hydronephrosis visualized.  Left Kidney: Length: 9.9 cm. Echogenicity within normal limits. No mass or hydronephrosis visualized.  Abdominal aorta: No aneurysm visualized. Difficult to fully characterize due to overlying bowel gas.  Other findings: None.  IMPRESSION: Unremarkable abdominal ultrasound.   Echo 05/31/2016 LV EF: 55% -   60%  ------------------------------------------------------------------- Indications:      CAD of native vessels  414.01.  ------------------------------------------------------------------- History:   PMH:  Nausea and diaphoresis. Myalgia. Hypertension.  ------------------------------------------------------------------- Study Conclusions  - Left ventricle: The cavity size was normal. Wall thickness was   normal. Systolic function was normal. The estimated ejection   fraction was in the range of 55% to 60%. Wall motion was normal;   there were no regional wall motion abnormalities.  Impressions:  - Normal LV systolic and distolic function; trace TR.  ASSESSMENT:    1. Coronary artery disease involving native coronary artery of native heart with other form of angina pectoris (HCC)   2. Hyperlipidemia   3. Essential hypertension   4. Tobacco abuse   5. Atypical chest pain      PLAN:  In order of problems listed above:  1. CAD: Although he does have intermittent chest discomfort, it is located on the right side, does not related to exertion. Recent echocardiogram reassuring. Also he says the right-sided chest discomfort is more noticeable with deep inspiration making it very atypical. I recommended continue observation for now.  2. HTN: His systolic blood pressure is in the 130s today, however he says he keep missing the night dose of metoprolol. He did not take his metoprolol last night either. Although I did mention we can potentially change it to Toprol-XL which is a once daily to her, however he could barely afford the current generic metoprolol tartrate, therefore I will hold off on switching to Toprol-XL which likely is more expensive.  3. HLD: repeat fasting lipid.   4. H/o psoriasis: diffuse skin lesion noted on bilateral legs. He says he has not seen by Internal Medicine or dermatology since he lost his insurance. [facility] to establish with a PCP.     Medication Adjustments/Labs and Tests Ordered: Current medicines are  reviewed at length with the patient today.  Concerns regarding medicines are outlined above.  Medication changes, Labs and Tests ordered today are listed in the Patient Instructions below. Patient Instructions  Medication Instructions:  Your physician recommends that you continue on your current medications as directed. Please refer to the Current Medication list given to you today.  Labwork: Your physician recommends that you return for lab work in: TODAY--lipid  Testing/Procedures: None ordered  Follow-Up: Your physician wants you to follow-up in: 6 MONTHS WITH DR CRENSHAW. You will receive a reminder letter in the mail two months in advance. If you don't receive  a letter, please call our office to schedule the follow-up appointment.   Any Other Special Instructions Will Be Listed Below (If Applicable). REFERRAL to [facility]. [address] [phone]    If you need a refill on your cardiac medications before your next appointment, please call your pharmacy.     Ramond Dial, [place]  06/21/2016 10:11 AM    [facility] [address] [place], [place], [place]  [address] Phone: [phone]; Fax: [phone]

## 2016-06-21 NOTE — Patient Instructions (Addendum)
Medication Instructions:  Your physician recommends that you continue on your current medications as directed. Please refer to the Current Medication list given to you today.  Labwork: Your physician recommends that you return for lab work in: TODAY--lipid  Testing/Procedures: None ordered  Follow-Up: Your physician wants you to follow-up in: 6 MONTHS WITH DR CRENSHAW. You will receive a reminder letter in the mail two months in advance. If you don't receive a letter, please call our office to schedule the follow-up appointment.   Any Other Special Instructions Will Be Listed Below (If Applicable). REFERRAL to [facility]. [address] [phone]    If you need a refill on your cardiac medications before your next appointment, please call your pharmacy.

## 2016-07-09 ENCOUNTER — Emergency Department (HOSPITAL_COMMUNITY)
Admission: EM | Admit: 2016-07-09 | Discharge: 2016-07-09 | Disposition: A | Discharge status: Home / Self Care | Payer: Self-pay | Attending: Emergency Medicine | Admitting: Emergency Medicine

## 2016-07-09 ENCOUNTER — Encounter (HOSPITAL_COMMUNITY): Payer: Self-pay | Admitting: *Deleted

## 2016-07-09 DIAGNOSIS — I10 Essential (primary) hypertension: Secondary | ICD-10-CM | POA: Insufficient documentation

## 2016-07-09 DIAGNOSIS — B9789 Other viral agents as the cause of diseases classified elsewhere: Secondary | ICD-10-CM | POA: Insufficient documentation

## 2016-07-09 DIAGNOSIS — R590 Localized enlarged lymph nodes: Secondary | ICD-10-CM

## 2016-07-09 DIAGNOSIS — Z87891 Personal history of nicotine dependence: Secondary | ICD-10-CM | POA: Insufficient documentation

## 2016-07-09 DIAGNOSIS — J029 Acute pharyngitis, unspecified: Secondary | ICD-10-CM

## 2016-07-09 DIAGNOSIS — I2510 Atherosclerotic heart disease of native coronary artery without angina pectoris: Secondary | ICD-10-CM | POA: Insufficient documentation

## 2016-07-09 DIAGNOSIS — Z955 Presence of coronary angioplasty implant and graft: Secondary | ICD-10-CM | POA: Insufficient documentation

## 2016-07-09 DIAGNOSIS — J028 Acute pharyngitis due to other specified organisms: Secondary | ICD-10-CM | POA: Insufficient documentation

## 2016-07-09 DIAGNOSIS — Z7982 Long term (current) use of aspirin: Secondary | ICD-10-CM | POA: Insufficient documentation

## 2016-07-09 DIAGNOSIS — Z79899 Other long term (current) drug therapy: Secondary | ICD-10-CM | POA: Insufficient documentation

## 2016-07-09 DIAGNOSIS — I252 Old myocardial infarction: Secondary | ICD-10-CM | POA: Insufficient documentation

## 2016-07-09 LAB — RAPID STREP SCREEN (MED CTR MEBANE ONLY): STREPTOCOCCUS, GROUP A SCREEN (DIRECT): NEGATIVE

## 2016-07-09 MED ORDER — IBUPROFEN 400 MG PO TABS
600.0000 mg | ORAL_TABLET | Freq: Once | ORAL | Status: AC
  Administered 2016-07-09: 600 mg via ORAL
  Filled 2016-07-09: qty 1

## 2016-07-09 MED ORDER — MAGIC MOUTHWASH
5.0000 mL | Freq: Once | ORAL | Status: AC
  Administered 2016-07-09: 5 mL via ORAL
  Filled 2016-07-09: qty 5

## 2016-07-09 MED ORDER — IBUPROFEN 600 MG PO TABS: 600.0000 mg | ORAL_TABLET | Freq: Four times a day (QID) | ORAL | 0 refills | Status: DC | PRN

## 2016-07-09 NOTE — ED Provider Notes (Signed)
[facility]-EMERGENCY DEPT Provider Note   CSN: [id number] Arrival date & time: 07/09/16  [id number]  By signing my name below, I, Soijett Blue, attest that this documentation has been prepared under the direction and in the presence of Derwood KaplanAnkit Jordan Pardini, MD. Electronically Signed: Soijett Blue, ED Scribe. 07/09/16. 3:40 AM.   History   Chief Complaint Chief Complaint  Patient presents with  . Sore Throat    HPI  Bradley Maffuccilvin G Aust Jr. is a 45 y.o. male with a PMHx of HTN, hyperlipidemia, who presents to the Emergency Department complaining of sore throat onset 3-4 days. Pt denies sick contacts. He states that he is having associated symptoms of painful swallowing and left sided neck pain. He states that he has not tried any medications for the relief for his symptoms. He denies trouble swallowing, fever, chills, nausea, vomiting, rhinorrhea, watery eyes, voice change, and any other symptoms. Denies PMHx of DM, rheumatoid arthritis, HIV, or sarcoidosis. Pt denies IV drug use. Pt denies having a PCP but he is working on obtaining [facility] and he has an appointment for 9/20. Pt reports that he does have a cardiologist.      The history is provided by the patient. No language interpreter was used.    Past Medical History:  Diagnosis Date  . CAD (coronary artery disease), native coronary artery 12/21/12   a. NSTEMI 2/14 => LHC 12/21/12: mLAD 90%, OM1 20%, mRCA 100%, EF 55-60%.  PCI: Xience Xpedition DES to mRCA and Xience Xpedition DES to mLAD.   [name]. GERD (gastroesophageal reflux disease)   . Hyperlipidemia   . Hypertension   . Psoriasis   . Tobacco abuse     Patient Active Problem List   Diagnosis Date Noted  . Chest pain 05/31/2016  . Nausea without vomiting 05/31/2016  . Nausea 05/31/2016  . CAD (coronary artery disease), native coronary artery 12/22/2012  . Hyperlipidemia 12/22/2012  . NSTEMI (non-ST elevated myocardial infarction) (HCC) 12/21/2012  .  Hypertension 12/21/2012  . Tobacco abuse 12/21/2012  . GERD (gastroesophageal reflux disease) 12/21/2012    Past Surgical History:  Procedure Laterality Date  . CORONARY ANGIOPLASTY WITH STENT PLACEMENT  12/21/12   90% mid LAD s/p DES, 20% OM1, 100% mid RCA s/p DES; LVEF 55-60%  . LEFT HEART CATHETERIZATION WITH CORONARY ANGIOGRAM N/A 12/21/2012   Procedure: LEFT HEART CATHETERIZATION WITH CORONARY ANGIOGRAM;  Surgeon: Kathleene Hazelhristopher D McAlhany, MD;  [facility];  Service: Cardiovascular;  Laterality: N/A;       Home Medications    Prior to Admission medications   Medication Sig Start Date End Date Taking? Authorizing Provider  amLODipine (NORVASC) 10 MG tablet Take 1 tablet (10 mg total) by mouth daily. 06/21/16   Azalee CourseHao Meng, PA  aspirin 81 MG EC tablet Take 1 tablet (81 mg total) by mouth daily. 06/21/16   Azalee CourseHao Meng, PA  atorvastatin (LIPITOR) 80 MG tablet TAKE ONE TABLET BY MOUTH ONCE DAILY 06/21/16   Azalee CourseHao Meng, PA  [employer] Tar Extract (PSORIASIN EX) Apply 1 application topically as needed (for psoriasis).    Historical Provider, MD  lisinopril (PRINIVIL,ZESTRIL) 40 MG tablet Take 1 tablet (40 mg total) by mouth daily. 06/21/16   Azalee CourseHao Meng, PA  metoprolol tartrate (LOPRESSOR) 25 MG tablet TAKE ONE TABLET BY MOUTH TWICE DAILY 06/21/16   Azalee CourseHao Meng, PA  nitroGLYCERIN (NITROSTAT) 0.4 MG SL tablet DISSOLVE ONE TABLET UNDER THE TONGUE EVERY 5 MINUTES AS NEEDED FOR CHEST PAIN.  DO  NOT EXCEED A TOTAL OF 3 DOSES IN 15 MINUTES 05/01/15   Leone Brand, NP    Family History Family History  Problem Relation Age of Onset  . Heart attack Father   . Heart attack Mother     Social History Social History  Substance Use Topics  . Smoking status: Former Smoker    Packs/day: 0.25    Years: 20.00    Types: Cigarettes    Quit date: 02/20/2016  . Smokeless tobacco: Never Used  . Alcohol use 0.6 oz/week    1 Standard drinks or equivalent per week     Allergies   Review of patient's allergies  indicates no known allergies.   Review of Systems Review of Systems  Constitutional: Negative for chills and fever.  HENT: Positive for sore throat. Negative for rhinorrhea, trouble swallowing and voice change.        + painful swallowing  Gastrointestinal: Negative for nausea and vomiting.     Physical Exam Updated Vital Signs BP 126/72   Pulse (!) 53   Temp 98.4 F (36.9 C) (Oral)   Resp 16   Ht 5\' 8"  (1.727 m)   Wt 206 lb (93.4 kg)   SpO2 98%   BMI 31.32 kg/m   Physical Exam  Constitutional: He is oriented to person, place, and time. He appears well-developed and well-nourished. No distress.  HENT:  Head: Normocephalic and atraumatic.  Mouth/Throat: Uvula is midline, oropharynx is clear and moist and mucous membranes are normal.  Eyes: EOM are normal.  Neck: Normal range of motion. Neck supple.  No trismus, no drooling  Cardiovascular: Normal rate.   Pulmonary/Chest: Effort normal. No stridor. No respiratory distress.  Abdominal: He exhibits no distension.  Musculoskeletal: Normal range of motion.  Lymphadenopathy:    He has cervical adenopathy.  Bilateral anterior cervical LAD.  Neurological: He is alert and oriented to person, place, and time.  Skin: Skin is warm and dry.  Psychiatric: He has a normal mood and affect. His behavior is normal.  Nursing note and vitals reviewed.    ED Treatments / Results  DIAGNOSTIC STUDIES: Oxygen Saturation is 98% on RA, nl by my interpretation.    COORDINATION OF CARE: 3:39 AM Discussed treatment plan with pt at bedside which includes rapid strep screen and culture, alternation ibuprofen and tylenol PRN and pt agreed to plan.   Labs (all labs ordered are listed, but only abnormal results are displayed) Labs Reviewed  RAPID STREP SCREEN (NOT AT [facility])  CULTURE, GROUP A STREP [facility])    Procedures Procedures (including critical care time)  Medications Ordered in ED Medications  ibuprofen (ADVIL,MOTRIN) tablet 600  mg (not administered)  magic mouthwash (not administered)     Initial Impression / Assessment and Plan / ED Course  I have reviewed the triage vital signs and the nursing notes.  Pertinent labs that were available during my care of the patient were reviewed by me and considered in my medical decision making (see chart for details).  Clinical Course    I personally performed the services described in this documentation, which was scribed in my presence. The recorded information has been reviewed and is accurate.  DDX includes: Viral syndrome Pharyngitis Mononucleosis  Pt comes in with sore throat. Back of the throat is normal. Mild cervical lymphadenopathy. Pt is non toxic, immunocompetent gentleman with no hard signs of airway obstruction or deep space infection.   Final Clinical Impressions(s) / ED Diagnoses   Final diagnoses:  Viral  pharyngitis  Cervical lymphadenopathy    New Prescriptions New Prescriptions   No medications on file        Derwood Kaplan, MD 07/09/16 [phone]

## 2016-07-09 NOTE — ED Triage Notes (Signed)
Patient presents with c/o sore throat for 3 days.  Tongue with white film on it.

## 2016-07-11 LAB — CULTURE, GROUP A STREP (THRC)

## 2016-07-14 ENCOUNTER — Ambulatory Visit: Payer: Self-pay | Attending: Internal Medicine | Admitting: Internal Medicine

## 2016-07-14 ENCOUNTER — Encounter: Payer: Self-pay | Admitting: Internal Medicine

## 2016-07-14 DIAGNOSIS — Z7982 Long term (current) use of aspirin: Secondary | ICD-10-CM | POA: Insufficient documentation

## 2016-07-14 DIAGNOSIS — I10 Essential (primary) hypertension: Secondary | ICD-10-CM | POA: Insufficient documentation

## 2016-07-14 DIAGNOSIS — I2510 Atherosclerotic heart disease of native coronary artery without angina pectoris: Secondary | ICD-10-CM | POA: Insufficient documentation

## 2016-07-14 DIAGNOSIS — Z79899 Other long term (current) drug therapy: Secondary | ICD-10-CM | POA: Insufficient documentation

## 2016-07-14 DIAGNOSIS — E785 Hyperlipidemia, unspecified: Secondary | ICD-10-CM | POA: Insufficient documentation

## 2016-07-14 DIAGNOSIS — R6884 Jaw pain: Secondary | ICD-10-CM | POA: Insufficient documentation

## 2016-07-14 DIAGNOSIS — L409 Psoriasis, unspecified: Secondary | ICD-10-CM | POA: Insufficient documentation

## 2016-07-14 DIAGNOSIS — Z87891 Personal history of nicotine dependence: Secondary | ICD-10-CM | POA: Insufficient documentation

## 2016-07-14 NOTE — Progress Notes (Signed)
Patient is here for HFU  Patient complains of left side neck pain being present. Patient states he was told he has swollen lymph nodes.  Patient has taken medication today and patient has eaten today.  Patient request dermatology referral for chronic psoriasis.  [facility] note  Sent to ED for jaw pain- sxs resolving on ibuprofen. No fever or chills, no tooth pain  Long term psoriasis- getting worse. He describes previous immune therapy with specacular results.  Past Medical History:  Diagnosis Date  . CAD (coronary artery disease), native coronary artery 12/21/12   a. NSTEMI 2/14 => LHC 12/21/12: mLAD 90%, OM1 20%, mRCA 100%, EF 55-60%.  PCI: Xience Xpedition DES to mRCA and Xience Xpedition DES to mLAD.   [name] GERD (gastroesophageal reflux disease)   . Hyperlipidemia   . Hypertension   . Psoriasis   . Tobacco abuse     Social History   Social History  . Marital status: Single    Spouse name: N/A  . Number of children: N/A  . Years of education: N/A   Occupational History  . Not on file.   Social History Main Topics  . Smoking status: Former Smoker    Packs/day: 0.25    Years: 20.00    Types: Cigarettes    Quit date: 02/20/2016  . Smokeless tobacco: Never Used  . Alcohol use 0.6 oz/week    1 Standard drinks or equivalent per week  . Drug use:     Types: Marijuana  . Sexual activity: Not on file   Other Topics Concern  . Not on file   Social History Narrative  . No narrative on file    Past Surgical History:  Procedure Laterality Date  . CORONARY ANGIOPLASTY WITH STENT PLACEMENT  12/21/12   90% mid LAD s/p DES, 20% OM1, 100% mid RCA s/p DES; LVEF 55-60%  . LEFT HEART CATHETERIZATION WITH CORONARY ANGIOGRAM N/A 12/21/2012   Procedure: LEFT HEART CATHETERIZATION WITH CORONARY ANGIOGRAM;  Surgeon: Kathleene Hazel, MD;  Location: [facility] CATH LAB;  Service: Cardiovascular;  Laterality: N/A;    Family History  Problem Relation Age of Onset  . Heart attack Father   .  Heart attack Mother     No Known Allergies  Current Outpatient Prescriptions on File Prior to Visit  Medication Sig Dispense Refill  . amLODipine (NORVASC) 10 MG tablet Take 1 tablet (10 mg total) by mouth daily. 30 tablet 6  . aspirin 81 MG EC tablet Take 1 tablet (81 mg total) by mouth daily. 30 tablet 6  . atorvastatin (LIPITOR) 80 MG tablet TAKE ONE TABLET BY MOUTH ONCE DAILY 30 tablet 6  . Coal Tar Extract (PSORIASIN EX) Apply 1 application topically as needed (for psoriasis).    [name] ibuprofen (ADVIL,MOTRIN) 600 MG tablet Take 1 tablet (600 mg total) by mouth every 6 (six) hours as needed. 30 tablet 0  . lisinopril (PRINIVIL,ZESTRIL) 40 MG tablet Take 1 tablet (40 mg total) by mouth daily. 30 tablet 6  . metoprolol tartrate (LOPRESSOR) 25 MG tablet TAKE ONE TABLET BY MOUTH TWICE DAILY 60 tablet 6  . nitroGLYCERIN (NITROSTAT) 0.4 MG SL tablet DISSOLVE ONE TABLET UNDER THE TONGUE EVERY 5 MINUTES AS NEEDED FOR CHEST PAIN.  DO NOT EXCEED A TOTAL OF 3 DOSES IN 15 MINUTES 25 tablet 5   No current facility-administered medications on file prior to visit.      patient denies chest pain, shortness of breath, orthopnea. Denies lower extremity edema, abdominal pain, change  in appetite, change in bowel movements. Patient denies rashes, musculoskeletal complaints. No other specific complaints in a complete review of systems.   BP 127/69 (BP Location: Right Arm, Patient Position: Sitting, Cuff Size: Normal)   Pulse 61   Temp 98.2 F (36.8 C) (Oral)   Resp 18   Ht 5\' 8"  (1.727 m)   Wt 213 lb 6.4 oz (96.8 kg)   SpO2 98%   BMI 32.45 kg/m  nad Chest CTA HEENT- no adenopathy, poor dentition, no abscess Skin- marked plaques on arms legs, torso  Psoriasis i'll try to refer to dermatology If unable to do so would consider immune therapy- it has worked before when other topical ans systemic therapies have not  Jaw pain-- resolving

## 2016-07-14 NOTE — Assessment & Plan Note (Signed)
i'll try to refer to dermatology If unable to do so would consider immune therapy- it has worked before when other topical ans systemic therapies have not

## 2016-07-14 NOTE — Patient Instructions (Signed)
We will try to arrange dermatology appointment  Call back if jaw pain does not completely resolve

## 2016-09-01 ENCOUNTER — Telehealth: Payer: Self-pay | Admitting: Cardiology

## 2016-09-01 DIAGNOSIS — I25118 Atherosclerotic heart disease of native coronary artery with other forms of angina pectoris: Secondary | ICD-10-CM

## 2016-09-01 DIAGNOSIS — R0789 Other chest pain: Secondary | ICD-10-CM

## 2016-09-01 MED ORDER — METOPROLOL TARTRATE 25 MG PO TABS: ORAL_TABLET | ORAL | 6 refills | Status: DC

## 2016-09-01 MED ORDER — LISINOPRIL 40 MG PO TABS: 40.0000 mg | ORAL_TABLET | Freq: Every day | ORAL | 6 refills | Status: DC

## 2016-09-01 MED ORDER — ATORVASTATIN CALCIUM 80 MG PO TABS: ORAL_TABLET | ORAL | 6 refills | Status: DC

## 2016-09-01 MED ORDER — AMLODIPINE BESYLATE 10 MG PO TABS: 10.0000 mg | ORAL_TABLET | Freq: Every day | ORAL | 6 refills | Status: DC

## 2016-09-01 NOTE — Telephone Encounter (Signed)
New message        *STAT* If patient is at the pharmacy, call can be transferred to refill team.   1. Which medications need to be refilled? (please list name of each medication and dose if known) amlodipine 10mg , lisinopril 40mg , metoprolol 25mg , atorvastatin 80mg   2. Which pharmacy/location (including street and city if local pharmacy) is medication to be sent to? [email or web address]  3. Do they need a 30 day or 90 day supply? 30day

## 2016-09-01 NOTE — Telephone Encounter (Signed)
Rx has been sent to the pharmacy electronically. ° °

## 2016-09-28 ENCOUNTER — Encounter: Payer: Self-pay | Admitting: Internal Medicine

## 2016-09-28 ENCOUNTER — Ambulatory Visit: Payer: Self-pay | Attending: Internal Medicine | Admitting: Internal Medicine

## 2016-09-28 VITALS — BP 144/79 | HR 50 | Temp 98.8°F | Resp 16 | Wt 211.4 lb

## 2016-09-28 DIAGNOSIS — M791 Myalgia, unspecified site: Secondary | ICD-10-CM

## 2016-09-28 DIAGNOSIS — E669 Obesity, unspecified: Secondary | ICD-10-CM

## 2016-09-28 DIAGNOSIS — Z125 Encounter for screening for malignant neoplasm of prostate: Secondary | ICD-10-CM

## 2016-09-28 DIAGNOSIS — Z114 Encounter for screening for human immunodeficiency virus [HIV]: Secondary | ICD-10-CM

## 2016-09-28 DIAGNOSIS — E119 Type 2 diabetes mellitus without complications: Secondary | ICD-10-CM | POA: Insufficient documentation

## 2016-09-28 DIAGNOSIS — I10 Essential (primary) hypertension: Secondary | ICD-10-CM | POA: Insufficient documentation

## 2016-09-28 DIAGNOSIS — Z7982 Long term (current) use of aspirin: Secondary | ICD-10-CM | POA: Insufficient documentation

## 2016-09-28 DIAGNOSIS — I2510 Atherosclerotic heart disease of native coronary artery without angina pectoris: Secondary | ICD-10-CM | POA: Insufficient documentation

## 2016-09-28 DIAGNOSIS — E1169 Type 2 diabetes mellitus with other specified complication: Secondary | ICD-10-CM

## 2016-09-28 DIAGNOSIS — Z131 Encounter for screening for diabetes mellitus: Secondary | ICD-10-CM

## 2016-09-28 DIAGNOSIS — Z87891 Personal history of nicotine dependence: Secondary | ICD-10-CM | POA: Insufficient documentation

## 2016-09-28 DIAGNOSIS — Z23 Encounter for immunization: Secondary | ICD-10-CM | POA: Insufficient documentation

## 2016-09-28 DIAGNOSIS — Z79899 Other long term (current) drug therapy: Secondary | ICD-10-CM | POA: Insufficient documentation

## 2016-09-28 DIAGNOSIS — M7521 Bicipital tendinitis, right shoulder: Secondary | ICD-10-CM | POA: Insufficient documentation

## 2016-09-28 DIAGNOSIS — D649 Anemia, unspecified: Secondary | ICD-10-CM | POA: Insufficient documentation

## 2016-09-28 DIAGNOSIS — E785 Hyperlipidemia, unspecified: Secondary | ICD-10-CM | POA: Insufficient documentation

## 2016-09-28 DIAGNOSIS — I252 Old myocardial infarction: Secondary | ICD-10-CM | POA: Insufficient documentation

## 2016-09-28 DIAGNOSIS — Z8249 Family history of ischemic heart disease and other diseases of the circulatory system: Secondary | ICD-10-CM | POA: Insufficient documentation

## 2016-09-28 DIAGNOSIS — K219 Gastro-esophageal reflux disease without esophagitis: Secondary | ICD-10-CM | POA: Insufficient documentation

## 2016-09-28 DIAGNOSIS — L409 Psoriasis, unspecified: Secondary | ICD-10-CM | POA: Insufficient documentation

## 2016-09-28 DIAGNOSIS — Z955 Presence of coronary angioplasty implant and graft: Secondary | ICD-10-CM | POA: Insufficient documentation

## 2016-09-28 LAB — CBC WITH DIFFERENTIAL/PLATELET
Basophils Absolute: 58 cells/uL (ref 0–200)
Basophils Relative: 1 %
EOS ABS: 116 {cells}/uL (ref 15–500)
Eosinophils Relative: 2 %
HEMATOCRIT: 44 % (ref 38.5–50.0)
Hemoglobin: 14.8 g/dL (ref 13.2–17.1)
LYMPHS PCT: 45 %
Lymphs Abs: 2610 cells/uL (ref 850–3900)
MCH: 30.1 pg (ref 27.0–33.0)
MCHC: 33.6 g/dL (ref 32.0–36.0)
MCV: 89.4 fL (ref 80.0–100.0)
MONO ABS: 406 {cells}/uL (ref 200–950)
MONOS PCT: 7 %
MPV: 10.8 fL (ref 7.5–12.5)
NEUTROS PCT: 45 %
Neutro Abs: 2610 cells/uL (ref 1500–7800)
Platelets: 243 10*3/uL (ref 140–400)
RBC: 4.92 MIL/uL (ref 4.20–5.80)
RDW: 13.5 % (ref 11.0–15.0)
WBC: 5.8 10*3/uL (ref 3.8–10.8)

## 2016-09-28 LAB — BASIC METABOLIC PANEL WITH GFR
BUN: 12 mg/dL (ref 7–25)
CALCIUM: 9.3 mg/dL (ref 8.6–10.3)
CHLORIDE: 103 mmol/L (ref 98–110)
CO2: 27 mmol/L (ref 20–31)
CREATININE: 1.02 mg/dL (ref 0.60–1.35)
GFR, Est African American: >89 mL/min (ref >=60)
GFR, Est Non African American: 88 mL/min (ref >=60)
Glucose, Bld: 91 mg/dL (ref 65–99)
Potassium: 4.3 mmol/L (ref 3.5–5.3)
Sodium: 138 mmol/L (ref 135–146)

## 2016-09-28 LAB — PSA: PSA: 0.5 ng/mL (ref <=4.0)

## 2016-09-28 LAB — POCT GLYCOSYLATED HEMOGLOBIN (HGB A1C): HEMOGLOBIN A1C: 7

## 2016-09-28 LAB — CK: CK TOTAL: 148 U/L (ref 7–232)

## 2016-09-28 MED ORDER — METFORMIN HCL 500 MG PO TABS: 500.0000 mg | ORAL_TABLET | Freq: Two times a day (BID) | ORAL | 3 refills | Status: DC

## 2016-09-28 MED ORDER — GLUCOSE BLOOD VI STRP: ORAL_STRIP | 12 refills | Status: DC

## 2016-09-28 MED ORDER — PREDNISONE 20 MG PO TABS: 60.0000 mg | ORAL_TABLET | Freq: Every day | ORAL | 0 refills | Status: DC

## 2016-09-28 MED ORDER — TRUE METRIX GO GLUCOSE METER W/DEVICE KIT: 1.0000 | PACK | Freq: Three times a day (TID) | 0 refills | Status: DC | PRN

## 2016-09-28 MED ORDER — NAPROXEN 500 MG PO TABS: 500.0000 mg | ORAL_TABLET | Freq: Two times a day (BID) | ORAL | 0 refills | Status: DC

## 2016-09-28 MED ORDER — TRUEPLUS LANCETS 26G MISC: 1.0000 | Freq: Three times a day (TID) | 12 refills | Status: DC | PRN

## 2016-09-28 MED FILL — predniSONE 20 MG TABS: 20 | 10 days supply | Qty: 20 | Fill #0

## 2016-09-28 MED FILL — TRUEplus LANCETS 28G MISC: 30 days supply | Qty: 100 | Fill #0

## 2016-09-28 MED FILL — TRUE METRIX BLOOD GLUCOSE M: W/DEVICE | 1 days supply | Qty: 1 | Fill #0

## 2016-09-28 MED FILL — NAPROXEN 500 MG TABLET: 500 | 15 days supply | Qty: 30 | Fill #0

## 2016-09-28 MED FILL — metFORMIN HCL 500 MG TABS: 500 | 30 days supply | Qty: 60 | Fill #0

## 2016-09-28 MED FILL — TRUE METRIX TEST STRIP: 30 days supply | Qty: 100 | Fill #0

## 2016-09-28 NOTE — Progress Notes (Signed)
Bradley Wolfe, is a 45 y.o. male  ZOX:[id number]  [id number]:[id number]  DOB - 10/08/1971  CC:  Chief Complaint  Patient presents with  . Establish Care       HPI: Bradley Wolfe is a 45 y.o. male here today to establish medical care., last seen by Dr Timoteo Gaul 07/14/16, w/ signif pmhx htn, cad, nstemi, psoriasis, predm.  Pt states that overall he is doing pain, neck pain resolved, but still having pains in his rue.  He noted it while here last time, but only twinges. It has progressed to pain when ever he lifts anything heavy on his right arm. He notes no pain when his arm is resting.  Denies tob, occassional etoh.    Only recently picked up all his meds last Thurs, and started taking them. He was off his meds for abt 3 wks prior.  Patient has No headache, No chest pain, No abdominal pain - No Nausea, No new weakness tingling or numbness, No Cough - SOB.  Still working on Educational psychologist. Has not seen derm yet.  Review of Systems: Per hpi, o/w all systems reviewed and negative.   No Known Allergies Past Medical History:  Diagnosis Date  . CAD (coronary artery disease), native coronary artery 12/21/12   a. NSTEMI 2/14 => LHC 12/21/12: mLAD 90%, OM1 20%, mRCA 100%, EF 55-60%.  PCI: Xience Xpedition DES to mRCA and Xience Xpedition DES to mLAD.   [name] GERD (gastroesophageal reflux disease)   . Hyperlipidemia   . Hypertension   . Psoriasis   . Tobacco abuse    Current Outpatient Prescriptions on File Prior to Visit  Medication Sig Dispense Refill  . amLODipine (NORVASC) 10 MG tablet Take 1 tablet (10 mg total) by mouth daily. 30 tablet 6  . aspirin 81 MG EC tablet Take 1 tablet (81 mg total) by mouth daily. 30 tablet 6  . atorvastatin (LIPITOR) 80 MG tablet TAKE ONE TABLET BY MOUTH ONCE DAILY 30 tablet 6  . lisinopril (PRINIVIL,ZESTRIL) 40 MG tablet Take 1 tablet (40 mg total) by mouth daily. 30 tablet 6  . metoprolol tartrate (LOPRESSOR) 25 MG tablet TAKE ONE TABLET BY MOUTH TWICE  DAILY 60 tablet 6  . nitroGLYCERIN (NITROSTAT) 0.4 MG SL tablet DISSOLVE ONE TABLET UNDER THE TONGUE EVERY 5 MINUTES AS NEEDED FOR CHEST PAIN.  DO NOT EXCEED A TOTAL OF 3 DOSES IN 15 MINUTES 25 tablet 5  . Coal Tar Extract (PSORIASIN EX) Apply 1 application topically as needed (for psoriasis).    [name] ibuprofen (ADVIL,MOTRIN) 600 MG tablet Take 1 tablet (600 mg total) by mouth every 6 (six) hours as needed. 30 tablet 0   No current facility-administered medications on file prior to visit.    Family History  Problem Relation Age of Onset  . Heart attack Father   . Heart attack Mother    Social History   Social History  . Marital status: Single    Spouse name: N/A  . Number of children: N/A  . Years of education: N/A   Occupational History  . Not on file.   Social History Main Topics  . Smoking status: Former Smoker    Packs/day: 0.25    Years: 20.00    Types: Cigarettes    Quit date: 02/20/2016  . Smokeless tobacco: Never Used  . Alcohol use 0.6 oz/week    1 Standard drinks or equivalent per week  . Drug use:     Types: Marijuana  . Sexual activity: Not  on file   Other Topics Concern  . Not on file   Social History Narrative  . No narrative on file    Objective:   Vitals:   09/28/16 1516  BP: (!) 144/79  Pulse: (!) 50  Resp: 16  Temp: 98.8 F (37.1 C)    Filed Weights   09/28/16 1516  Weight: 211 lb 6.4 oz (95.9 kg)    BP Readings from Last 3 Encounters:  09/28/16 (!) 144/79  07/14/16 127/69  07/09/16 125/68    Physical Exam: Constitutional: Patient appears well-developed and well-nourished. No distress. AAOx3 HENT: Normocephalic, atraumatic, External right and left ear normal. Oropharynx is clear and moist. bilat TMs clear. Eyes: Conjunctivae and EOM are normal. PERRL, no scleral icterus. Neck: Normal ROM. Neck supple. No JVD.  CVS: RRR, S1/S2 +, no murmurs, no gallops, no carotid bruit.  Pulmonary: Effort and breath sounds normal, no stridor,  rhonchi, wheezes, rales.  Abdominal: Soft. BS +, no distension, tenderness, rebound or guarding.  Musculoskeletal: Normal range of motion. No edema.  Mild ttp at biceps insertion sight, right arm., ttp against active motion as well.  nttp at bilat epicondyles.  LE: bilat/ no c/c/e, pulses 2+ bilateral. Neuro: Alert. muscle tone coordination wnl. No cranial nerve deficit grossly. Skin: Skin is warm and dry. No rash noted. Not diaphoretic. No erythema. No pallor. Psychiatric: Normal mood and affect. Behavior, judgment, thought content normal.  Lab Results  Component Value Date   WBC 6.1 05/31/2016   HGB 12.8 (L) 05/31/2016   HCT 37.5 (L) 05/31/2016   MCV 86.0 05/31/2016   PLT 211 05/31/2016   Lab Results  Component Value Date   CREATININE 0.86 05/31/2016   BUN 9 05/31/2016   NA 137 05/31/2016   K 3.8 05/31/2016   CL 104 05/31/2016   CO2 26 05/31/2016    Lab Results  Component Value Date   HGBA1C 5.6 12/21/2012   Lipid Panel     Component Value Date/Time   CHOL 128 05/15/2015 1102   TRIG 59.0 05/15/2015 1102   HDL 52.60 05/15/2015 1102   CHOLHDL 2 05/15/2015 1102   VLDL 11.8 05/15/2015 1102   LDLCALC 64 05/15/2015 1102        Depression screen PHQ 2/9 09/28/2016 07/14/2016  Decreased Interest 1 1  Down, Depressed, Hopeless 1 1  PHQ - 2 Score 2 2  Altered sleeping 0 0  Tired, decreased energy 1 2  Change in appetite 1 2  Feeling bad or failure about yourself  0 1  Trouble concentrating 0 1  Moving slowly or fidgety/restless 0 0  Suicidal thoughts 0 0  PHQ-9 Score 4 8    Assessment and plan:   1. HTN (hypertension), benign Not controlled, did not start meds until this past Thurs. - recd take all meds as prescribed. - BASIC METABOLIC PANEL WITH GFR - low salt diet recd.  2. Biceps tendonitis on right Short naproxen and steroid taper. - r/o statin myopathy w/ ck.  3. Dm per screening a1c 7.0 dw pt diet/exercise, info provided Metformin 500bid started, dw  pt s/e to watch out for w/ metformin  4. Encounter for screening for HIV - HIV antibody (with reflex)  5. Myalgia See #2 - CK  6. Prostate cancer screening - PSA  7. Anemia, unspecified type - CBC with Differential  8. tdap and flu today. Will need pneumococcal 23v next visit.  Return in about 3 months (around 12/27/2016), or if symptoms worsen or fail to  improve.  The patient was given clear instructions to go to ER or return to medical center if symptoms don't improve, worsen or new problems develop. The patient verbalized understanding. The patient was told to call to get lab results if they haven't heard anything in the next week.    This note has been created with Education officer, environmentalDragon speech recognition software and smart phrase technology. Any transcriptional errors are unintentional.   Pete Glatterawn T Blayn Whetsell, MD, MBA/MHA [facility] [place], [place] [phone]   09/28/2016, 4:16 PM

## 2016-09-28 NOTE — Patient Instructions (Addendum)
Financial aid appt   Hypertension Hypertension is another name for high blood pressure. High blood pressure forces your heart to work harder to pump blood. A blood pressure reading has two numbers, which includes a higher number over a lower number (example: 110/72). Follow these instructions at home:  Have your blood pressure rechecked by your doctor.  Only take medicine as told by your doctor. Follow the directions carefully. The medicine does not work as well if you skip doses. Skipping doses also puts you at risk for problems.  Do not smoke.  Monitor your blood pressure at home as told by your doctor. Contact a doctor if:  You think you are having a reaction to the medicine you are taking.  You have repeat headaches or feel dizzy.  You have puffiness (swelling) in your ankles.  You have trouble with your vision. Get help right away if:  You get a very bad headache and are confused.  You feel weak, numb, or faint.  You get chest or belly (abdominal) pain.  You throw up (vomit).  You cannot breathe very well. This information is not intended to replace advice given to you by your health care provider. Make sure you discuss any questions you have with your health care provider. Document Released: 03/29/2008 Document Revised: 03/18/2016 Document Reviewed: 08/03/2013 Elsevier Interactive Patient Education  2017 Elsevier Inc.  -  Low-Sodium Eating Plan Sodium raises blood pressure and causes water to be held in the body. Getting less sodium from food will help lower your blood pressure, reduce any swelling, and protect your heart, liver, and kidneys. We get sodium by adding salt (sodium chloride) to food. Most of our sodium comes from canned, boxed, and frozen foods. Restaurant foods, fast foods, and pizza are also very high in sodium. Even if you take medicine to lower your blood pressure or to reduce fluid in your body, getting less sodium from your food is important. What  is my plan? Most people should limit their sodium intake to 2,300 mg a day. Your health care provider recommends that you limit your sodium intake to 2,000mg  a day. What do I need to know about this eating plan? For the low-sodium eating plan, you will follow these general guidelines:  Choose foods with a % Daily Value for sodium of less than 5% (as listed on the food label).  Use salt-free seasonings or herbs instead of table salt or sea salt.  Check with your health care provider or pharmacist before using salt substitutes.  Eat fresh foods.  Eat more vegetables and fruits.  Limit canned vegetables. If you do use them, rinse them well to decrease the sodium.  Limit cheese to 1 oz (28 g) per day.  Eat lower-sodium products, often labeled as "lower sodium" or "no salt added."  Avoid foods that contain monosodium glutamate (MSG). MSG is sometimes added to [place] food and some canned foods.  Check food labels (Nutrition Facts labels) on foods to learn how much sodium is in one serving.  Eat more home-cooked food and less restaurant, buffet, and fast food.  When eating at a restaurant, ask that your food be prepared with less salt, or no salt if possible. How do I read food labels for sodium information? The Nutrition Facts label lists the amount of sodium in one serving of the food. If you eat more than one serving, you must multiply the listed amount of sodium by the number of servings. Food labels may also identify foods  as:  Sodium free-Less than 5 mg in a serving.  Very low sodium-35 mg or less in a serving.  Low sodium-140 mg or less in a serving.  Light in sodium-50% less sodium in a serving. For example, if a food that usually has 300 mg of sodium is changed to become light in sodium, it will have 150 mg of sodium.  Reduced sodium-25% less sodium in a serving. For example, if a food that usually has 400 mg of sodium is changed to reduced sodium, it will have 300 mg of  sodium. What foods can I eat? Grains  Low-sodium cereals, including oats, puffed wheat and rice, and shredded wheat cereals. Low-sodium crackers. Unsalted rice and pasta. Lower-sodium bread. Vegetables  Frozen or fresh vegetables. Low-sodium or reduced-sodium canned vegetables. Low-sodium or reduced-sodium tomato sauce and paste. Low-sodium or reduced-sodium tomato and vegetable juices. Fruits  Fresh, frozen, and canned fruit. Fruit juice. Meat and Other Protein Products  Low-sodium canned tuna and salmon. Fresh or frozen meat, poultry, seafood, and fish. Lamb. Unsalted nuts. Dried beans, peas, and lentils without added salt. Unsalted canned beans. Homemade soups without salt. Eggs. Dairy  Milk. Soy milk. Ricotta cheese. Low-sodium or reduced-sodium cheeses. Yogurt. Condiments  Fresh and dried herbs and spices. Salt-free seasonings. Onion and garlic powders. Low-sodium varieties of mustard and ketchup. Fresh or refrigerated horseradish. Lemon juice. Fats and Oils  Reduced-sodium salad dressings. Unsalted butter. Other  Unsalted popcorn and pretzels. The items listed above may not be a complete list of recommended foods or beverages. Contact your dietitian for more options.  What foods are not recommended? Grains  Instant hot cereals. Bread stuffing, pancake, and biscuit mixes. Croutons. Seasoned rice or pasta mixes. Noodle soup cups. Boxed or frozen macaroni and cheese. Self-rising flour. Regular salted crackers. Vegetables  Regular canned vegetables. Regular canned tomato sauce and paste. Regular tomato and vegetable juices. Frozen vegetables in sauces. Salted [place] fries. Olives. Rosita Fire. Relishes. Sauerkraut. Salsa. Meat and Other Protein Products  Salted, canned, smoked, spiced, or pickled meats, seafood, or fish. Bacon, ham, sausage, hot dogs, corned beef, chipped beef, and packaged luncheon meats. Salt pork. Jerky. Pickled herring. Anchovies, regular canned tuna, and sardines. Salted  nuts. Dairy  Processed cheese and cheese spreads. Cheese curds. Blue cheese and cottage cheese. Buttermilk. Condiments  Onion and garlic salt, seasoned salt, table salt, and sea salt. Canned and packaged gravies. Worcestershire sauce. Tartar sauce. Barbecue sauce. Teriyaki sauce. Soy sauce, including reduced sodium. Steak sauce. Fish sauce. Oyster sauce. Cocktail sauce. Horseradish that you find on the shelf. Regular ketchup and mustard. Meat flavorings and tenderizers. Bouillon cubes. Hot sauce. Tabasco sauce. Marinades. Taco seasonings. Relishes. Fats and Oils  Regular salad dressings. Salted butter. Margarine. Ghee. Bacon fat. Other  Potato and tortilla chips. Corn chips and puffs. Salted popcorn and pretzels. Canned or dried soups. Pizza. Frozen entrees and pot pies. The items listed above may not be a complete list of foods and beverages to avoid. Contact your dietitian for more information.  This information is not intended to replace advice given to you by your health care provider. Make sure you discuss any questions you have with your health care provider. Document Released: 04/02/2002 Document Revised: 03/18/2016 Document Reviewed: 08/15/2013 Elsevier Interactive Patient Education  2017 Elsevier Inc.  -  Biceps Tendon Tendinitis (Proximal) and Tenosynovitis The proximal biceps tendon is a strong cord of tissue that connects the biceps muscle, on the front of the upper arm, to the shoulder blade. Tendinitis is inflammation  of a tendon. Tenosynovitis is inflammation of the lining around the tendon (tendon sheath). These conditions often occur at the same time, and they can interfere with the ability to bend the elbow and turn the hand palm-up (supination). Proximal biceps tendon tendinitis and tenosynovitis are usually caused by overusing the shoulder joint and the biceps muscle. These conditions usually heal within 6 weeks. Proximal biceps tendon tendinitis may include a grade 1 or grade  2 strain of the tendon. A grade 1 strain is mild, and it involves a slight pull of the tendon without any stretching or noticeable tearing of the tendon. There is usually no loss of biceps muscle strength. A grade 2 strain is moderate, and it involves a small tear in the tendon. The tendon is stretched, and biceps strength is usually decreased. What are the causes? This condition may be caused by:  A sudden increase in frequency or intensity of activity that involves the shoulder and the biceps muscle.  Overuse of the biceps muscle. This can happen when you do the same movements over and over, such as:  Supination.  Forceful straightening (hyperextension) of the elbow.  Bending the elbow.  A direct, forceful hit or injury (trauma) to the elbow. This is rare. What increases the risk? The following factors may make you more likely to develop this condition:  Playing contact sports.  Playing sports that involve throwing and overhead movements, including racket sports, gymnastics, weight lifting, or bodybuilding.  Doing physical labor.  Having poor strength and flexibility of the arm and shoulder. What are the signs or symptoms? Symptoms of this condition may include:  Pain and inflammation in the front of the shoulder. Pain may get worse with movement, especially when you use resistance, as in weight lifting.  A feeling of warmth in the front of the shoulder.  Limited range of motion of the shoulder and the elbow.  A crackling sound (crepitation) when you move or touch the shoulder or the upper arm. In some cases, symptoms may return (recur) after treatment, and they may be long-lasting (chronic). How is this diagnosed? This condition is diagnosed based on your symptoms, your medical history, and a physical exam. You may have tests, including X-rays or MRIs. Your health care provider may test your range of motion by asking you to do arm movements. How is this treated? This  condition is treated by resting and icing the injured area, and by doing physical therapy exercises. Depending on the severity of your condition, treatment may also include:  Medicines to help relieve pain and inflammation.  Ultrasound therapy. This is the application of sound waves to the injured area.  Injecting medicines (corticosteroids) into your tendon sheath.  Injecting medicines that numb the area (local anesthetics).  Surgery to remove the damaged part of the tendon and reattach the undamaged part of the tendon to the arm bone (humerus). This is usually only done if you have symptoms that do not get better with other treatment methods. Follow these instructions at home: Managing pain, stiffness, and swelling  If directed, put ice on the injured area:  Put ice in a plastic bag.  Place a towel between your skin and the bag.  Leave the ice on for 20 minutes, 2-3 times a day.  Move your fingers often to avoid stiffness and to lessen swelling.  Raise (elevate) the injured area above the level of your heart while you are sitting or lying down.  If directed, apply heat to the affected  area before you exercise. Use the heat source that your health care provider recommends, such as a moist heat pack or a heating pad.  Place a towel between your skin and the heat source.  Leave the heat on for 20-30 minutes.  Remove the heat if your skin turns bright red. This is especially important if you are unable to feel pain, heat, or cold. You may have a greater risk of getting burned. Activity  Return to your normal activities as told by your health care provider. Ask your health care provider what activities are safe for you.  Do not lift anything that is heavier than 10 lb (4.5 kg) until your health care provider tells you that it is safe.  Avoid activities that cause pain or make your condition worse.  Do exercises as told by your health care provider. General instructions  Take  over-the-counter and prescription medicines only as told by your health care provider.  Do not drive or operate heavy machinery while taking prescription pain medicines.  Keep all follow-up visits as told by your health care provider. This is important. How is this prevented?  Warm up and stretch before being active.  Cool down and stretch after being active.  Give your body time to rest between periods of activity.  Make sure any equipment that you use is fitted to you.  Be safe and responsible while being active to avoid falls.  Do at least 150 minutes of moderate-intensity aerobic exercise each week, such as brisk walking or water aerobics.  Maintain physical fitness, including:  Strength.  Flexibility.  Cardiovascular fitness.  Endurance. Contact a health care provider if:  You have symptoms that get worse or do not get better after 2 weeks of treatment.  You develop new symptoms. Get help right away if:  You develop severe pain. This information is not intended to replace advice given to you by your health care provider. Make sure you discuss any questions you have with your health care provider. Document Released: 10/11/2005 Document Revised: 06/17/2016 Document Reviewed: 09/19/2015 Elsevier Interactive Patient Education  2017 Elsevier Inc. Td Vaccine (Tetanus and Diphtheria): What You Need to Know 1. Why get vaccinated? Tetanus  and diphtheria are very serious diseases. They are rare in the [place] today, but people who do become infected often have severe complications. Td vaccine is used to protect adolescents and adults from both of these diseases. Both tetanus and diphtheria are infections caused by bacteria. Diphtheria spreads from person to person through coughing or sneezing. Tetanus-causing bacteria enter the body through cuts, scratches, or wounds. TETANUS (lockjaw) causes painful muscle tightening and stiffness, usually all over the body.  It can  lead to tightening of muscles in the head and neck so you can't open your mouth, swallow, or sometimes even breathe. Tetanus kills about 1 out of every 10 people who are infected even after receiving the best medical care. DIPHTHERIA can cause a thick coating to form in the back of the throat.  It can lead to breathing problems, paralysis, heart failure, and death. Before vaccines, as many as 200,000 cases of diphtheria and hundreds of cases of tetanus were reported in the [place] each year. Since vaccination began, reports of cases for both diseases have dropped by about 99%. 2. Td vaccine Td vaccine can protect adolescents and adults from tetanus and diphtheria. Td is usually given as a booster dose every 10 years but it can also be given earlier after a severe  and dirty wound or burn. Another vaccine, called Tdap, which protects against pertussis in addition to tetanus and diphtheria, is sometimes recommended instead of Td vaccine. Your doctor or the person giving you the vaccine can give you more information. Td may safely be given at the same time as other vaccines. 3. Some people should not get this vaccine  A person who has ever had a life-threatening allergic reaction after a previous dose of any tetanus or diphtheria containing vaccine, OR has a severe allergy to any part of this vaccine, should not get Td vaccine. Tell the person giving the vaccine about any severe allergies.  Talk to your doctor if you:  had severe pain or swelling after any vaccine containing diphtheria or tetanus,  ever had a condition called Guillain Barre Syndrome (GBS),  aren't feeling well on the day the shot is scheduled. 4. What are the risks from Td vaccine? With any medicine, including vaccines, there is a chance of side effects. These are usually mild and go away on their own. Serious reactions are also possible but are rare. Most people who get Td vaccine do not have any problems with it. Mild  problems following Td vaccine: (Did not interfere with activities)  Pain where the shot was given (about 8 people in 10)  Redness or swelling where the shot was given (about 1 person in 4)  Mild fever (rare)  Headache (about 1 person in 4)  Tiredness (about 1 person in 4) Moderate problems following Td vaccine: (Interfered with activities, but did not require medical attention)  Fever over 102F (rare) Severe problems following Td vaccine: (Unable to perform usual activities; required medical attention)  Swelling, severe pain, bleeding and/or redness in the arm where the shot was given (rare). Problems that could happen after any vaccine:  People sometimes faint after a medical procedure, including vaccination. Sitting or lying down for about 15 minutes can help prevent fainting, and injuries caused by a fall. Tell your doctor if you feel dizzy, or have vision changes or ringing in the ears.  Some people get severe pain in the shoulder and have difficulty moving the arm where a shot was given. This happens very rarely.  Any medication can cause a severe allergic reaction. Such reactions from a vaccine are very rare, estimated at fewer than 1 in a million doses, and would happen within a few minutes to a few hours after the vaccination. As with any medicine, there is a very remote chance of a vaccine causing a serious injury or death. The safety of vaccines is always being monitored. For more information, visit: http://floyd.org/www.cdc.gov/vaccinesafety/ 5. What if there is a serious reaction? What should I look for? Look for anything that concerns you, such as signs of a severe allergic reaction, very high fever, or unusual behavior. Signs of a severe allergic reaction can include hives, swelling of the face and throat, difficulty breathing, a fast heartbeat, dizziness, and weakness. These would usually start a few minutes to a few hours after the vaccination. What should I do?  If you think it is  a severe allergic reaction or other emergency that can't wait, call 9-1-1 or get the person to the nearest hospital. Otherwise, call your doctor.  Afterward, the reaction should be reported to the Vaccine Adverse Event Reporting System (VAERS). Your doctor might file this report, or you can do it yourself through the VAERS web site at www.vaers.LAgents.nohhs.gov, or by calling 1-231-704-2791.  VAERS does not give medical advice.  6. The National Vaccine Injury Compensation Program The [employer] Vaccine Injury Compensation Program (VICP) is a federal program that was created to compensate people who may have been injured by certain vaccines. Persons who believe they may have been injured by a vaccine can learn about the program and about filing a claim by calling 1-319-043-7481 or visiting the VICP website at SpiritualWord.at. There is a time limit to file a claim for compensation. 7. How can I learn more?  Ask your doctor. He or she can give you the vaccine package insert or suggest other sources of information.  Call your local or state health department.  Contact the Centers for Disease Control and Prevention (CDC):  Call 365 184 8188 (1-800-CDC-INFO)  Visit CDC's website at PicCapture.uy CDC Td Vaccine VIS (02/03/16) This information is not intended to replace advice given to you by your health care provider. Make sure you discuss any questions you have with your health care provider. Document Released: 08/08/2006 Document Revised: 07/01/2016 Document Reviewed: 07/01/2016 Elsevier Interactive Patient Education  2017 Elsevier Inc. Influenza Virus Vaccine injection (Fluarix) What is this medicine? INFLUENZA VIRUS VACCINE (in floo EN zuh VAHY ruhs vak SEEN) helps to reduce the risk of getting influenza also known as the flu. This medicine may be used for other purposes; ask your health care provider or pharmacist if you have questions. COMMON BRAND NAME(S): Fluarix,  Fluzone What should I tell my health care provider before I take this medicine? They need to know if you have any of these conditions: -bleeding disorder like hemophilia -fever or infection -Guillain-Barre syndrome or other neurological problems -immune system problems -infection with the human immunodeficiency virus (HIV) or AIDS -low blood platelet counts -multiple sclerosis -an unusual or allergic reaction to influenza virus vaccine, eggs, chicken proteins, latex, gentamicin, other medicines, foods, dyes or preservatives -pregnant or trying to get pregnant -breast-feeding How should I use this medicine? This vaccine is for injection into a muscle. It is given by a health care professional. A copy of Vaccine Information Statements will be given before each vaccination. Read this sheet carefully each time. The sheet may change frequently. Talk to your pediatrician regarding the use of this medicine in children. Special care may be needed. Overdosage: If you think you have taken too much of this medicine contact a poison control center or emergency room at once. NOTE: This medicine is only for you. Do not share this medicine with others. What if I miss a dose? This does not apply. What may interact with this medicine? -chemotherapy or radiation therapy -medicines that lower your immune system like etanercept, anakinra, infliximab, and adalimumab -medicines that treat or prevent blood clots like warfarin -phenytoin -steroid medicines like prednisone or cortisone -theophylline -vaccines This list may not describe all possible interactions. Give your health care provider a list of all the medicines, herbs, non-prescription drugs, or dietary supplements you use. Also tell them if you smoke, drink alcohol, or use illegal drugs. Some items may interact with your medicine. What should I watch for while using this medicine? Report any side effects that do not go away within 3 days to your  doctor or health care professional. Call your health care provider if any unusual symptoms occur within 6 weeks of receiving this vaccine. You may still catch the flu, but the illness is not usually as bad. You cannot get the flu from the vaccine. The vaccine will not protect against colds or other illnesses that may cause fever. The vaccine is needed every  year. What side effects may I notice from receiving this medicine? Side effects that you should report to your doctor or health care professional as soon as possible: -allergic reactions like skin rash, itching or hives, swelling of the face, lips, or tongue Side effects that usually do not require medical attention (report to your doctor or health care professional if they continue or are bothersome): -fever -headache -muscle aches and pains -pain, tenderness, redness, or swelling at site where injected -weak or tired This list may not describe all possible side effects. Call your doctor for medical advice about side effects. You may report side effects to FDA at 1-800-FDA-1088. Where should I keep my medicine? This vaccine is only given in a clinic, pharmacy, doctor's office, or other health care setting and will not be stored at home. NOTE: This sheet is a summary. It may not cover all possible information. If you have questions about this medicine, talk to your doctor, pharmacist, or health care provider.  2017 Elsevier/Gold Standard (2008-05-08 09:30:40)   - Diabetes Mellitus and Food It is important for you to manage your blood sugar (glucose) level. Your blood glucose level can be greatly affected by what you eat. Eating healthier foods in the appropriate amounts throughout the day at about the same time each day will help you control your blood glucose level. It can also help slow or prevent worsening of your diabetes mellitus. Healthy eating may even help you improve the level of your blood pressure and reach or maintain a healthy  weight. General recommendations for healthful eating and cooking habits include:  Eating meals and snacks regularly. Avoid going long periods of time without eating to lose weight.  Eating a diet that consists mainly of plant-based foods, such as fruits, vegetables, nuts, legumes, and whole grains.  Using low-heat cooking methods, such as baking, instead of high-heat cooking methods, such as deep frying. Work with your dietitian to make sure you understand how to use the Nutrition Facts information on food labels. How can food affect me? Carbohydrates  Carbohydrates affect your blood glucose level more than any other type of food. Your dietitian will help you determine how many carbohydrates to eat at each meal and teach you how to count carbohydrates. Counting carbohydrates is important to keep your blood glucose at a healthy level, especially if you are using insulin or taking certain medicines for diabetes mellitus. Alcohol  Alcohol can cause sudden decreases in blood glucose (hypoglycemia), especially if you use insulin or take certain medicines for diabetes mellitus. Hypoglycemia can be a life-threatening condition. Symptoms of hypoglycemia (sleepiness, dizziness, and disorientation) are similar to symptoms of having too much alcohol. If your health care provider has given you approval to drink alcohol, do so in moderation and use the following guidelines:  Women should not have more than one drink per day, and men should not have more than two drinks per day. One drink is equal to:  12 oz of beer.  5 oz of wine.  1 oz of hard liquor.  Do not drink on an empty stomach.  Keep yourself hydrated. Have water, diet soda, or unsweetened iced tea.  Regular soda, juice, and other mixers might contain a lot of carbohydrates and should be counted. What foods are not recommended? As you make food choices, it is important to remember that all foods are not the same. Some foods have fewer  nutrients per serving than other foods, even though they might have the same number of  calories or carbohydrates. It is difficult to get your body what it needs when you eat foods with fewer nutrients. Examples of foods that you should avoid that are high in calories and carbohydrates but low in nutrients include:  Trans fats (most processed foods list trans fats on the Nutrition Facts label).  Regular soda.  Juice.  Candy.  Sweets, such as cake, pie, doughnuts, and cookies.  Fried foods. What foods can I eat? Eat nutrient-rich foods, which will nourish your body and keep you healthy. The food you should eat also will depend on several factors, including:  The calories you need.  The medicines you take.  Your weight.  Your blood glucose level.  Your blood pressure level.  Your cholesterol level. You should eat a variety of foods, including:  Protein.  Lean cuts of meat.  Proteins low in saturated fats, such as fish, egg whites, and beans. Avoid processed meats.  Fruits and vegetables.  Fruits and vegetables that may help control blood glucose levels, such as apples, mangoes, and yams.  Dairy products.  Choose fat-free or low-fat dairy products, such as milk, yogurt, and cheese.  Grains, bread, pasta, and rice.  Choose whole grain products, such as multigrain bread, whole oats, and brown rice. These foods may help control blood pressure.  Fats.  Foods containing healthful fats, such as nuts, avocado, olive oil, canola oil, and fish. Does everyone with diabetes mellitus have the same meal plan? Because every person with diabetes mellitus is different, there is not one meal plan that works for everyone. It is very important that you meet with a dietitian who will help you create a meal plan that is just right for you. This information is not intended to replace advice given to you by your health care provider. Make sure you discuss any questions you have with your  health care provider. Document Released: 07/08/2005 Document Revised: 03/18/2016 Document Reviewed: 09/07/2013 Elsevier Interactive Patient Education  2017 [employer]. -  Tips for Eating Away From Home If You Have Diabetes Controlling your level of blood glucose, also known as blood sugar, can be challenging. It can be even more difficult when you do not prepare your own meals. The following tips can help you manage your diabetes when you eat away from home. Planning ahead Plan ahead if you know you will be eating away from home:  Ask your health care provider how to time meals and medicine if you are taking insulin.  Make a list of restaurants near you that offer healthy choices. If they have a carry-out menu, take it home and plan what you will order ahead of time.  Look up the restaurant you want to eat at online. Many chain and fast-food restaurants list nutritional information online. Use this information to choose the healthiest options and to calculate how many carbohydrates will be in your meal.  Use a carbohydrate-counting book or mobile app to look up the carbohydrate content and serving size of the foods you want to eat.  Become familiar with serving sizes and learn to recognize how many servings are in a portion. This will allow you to estimate how many carbohydrates you can eat. Free foods A "free food" is any food or drink that has less than 5 g of carbohydrates per serving. Free foods include:  Many vegetables.  Hard boiled eggs.  Nuts or seeds.  Olives.  Cheeses.  Meats. These types of foods make good appetizer choices and are often available at  salad bars. Lemon juice, vinegar, or a low-calorie salad dressing of fewer than 20 calories per serving can be used as a "free" salad dressing. Choices to reduce carbohydrates  Substitute nonfat sweetened yogurt with a sugar-free yogurt. Yogurt made from soy milk may also be used, but you will still want a sugar-free or  plain option to choose a lower carbohydrate amount.  Ask your server to take away the bread basket or chips from your table.  Order fresh fruit. A salad bar often offers fresh fruit choices. Avoid canned fruit because it is usually packed in sugar or syrup.  Order a salad, and eat it without dressing. Or, create a "free" salad dressing.  Ask for substitutions. For example, instead of [place] fries, request an order of a vegetable such as salad, green beans, or broccoli. Other tips  If you take insulin, take the insulin once your food arrives to your table. This will ensure your insulin and food are timed correctly.  Ask your server about the portion size before your order, and ask for a take-out box if the portion has more servings than you should have. When your food comes, leave the amount you should have on the plate, and put the rest in the take-out box.  Consider splitting an entree with someone and ordering a side salad. This information is not intended to replace advice given to you by your health care provider. Make sure you discuss any questions you have with your health care provider. Document Released: 10/11/2005 Document Revised: 03/18/2016 Document Reviewed: 01/08/2014 Elsevier Interactive Patient Education  2017 [employer]   -  Diabetes Mellitus and Exercise Exercising regularly is important for your overall health, especially when you have diabetes (diabetes mellitus). Exercising is not only about losing weight. It has many health benefits, such as increasing muscle strength and bone density and reducing body fat and stress. This leads to improved fitness, flexibility, and endurance, all of which result in better overall health. Exercise has additional benefits for people with diabetes, including:  Reducing appetite.  Helping to lower and control blood glucose.  Lowering blood pressure.  Helping to control amounts of fatty substances (lipids) in the blood, such as  cholesterol and triglycerides.  Helping the body to respond better to insulin (improving insulin sensitivity).  Reducing how much insulin the body needs.  Decreasing the risk for heart disease by:  Lowering cholesterol and triglyceride levels.  Increasing the levels of good cholesterol.  Lowering blood glucose levels. What is my activity plan? Your health care provider or certified diabetes educator can help you make a plan for the type and frequency of exercise (activity plan) that works for you. Make sure that you:  Do at least 150 minutes of moderate-intensity or vigorous-intensity exercise each week. This could be brisk walking, biking, or water aerobics.  Do stretching and strength exercises, such as yoga or weightlifting, at least 2 times a week.  Spread out your activity over at least 3 days of the week.  Get some form of physical activity every day.  Do not go more than 2 days in a row without some kind of physical activity.  Avoid being inactive for more than 90 minutes at a time. Take frequent breaks to walk or stretch.  Choose a type of exercise or activity that you enjoy, and set realistic goals.  Start slowly, and gradually increase the intensity of your exercise over time. What do I need to know about managing my diabetes?  Check  your blood glucose before and after exercising.  If your blood glucose is higher than 240 mg/dL (95.213.3 mmol/L) before you exercise, check your urine for ketones. If you have ketones in your urine, do not exercise until your blood glucose returns to normal.  Know the symptoms of low blood glucose (hypoglycemia) and how to treat it. Your risk for hypoglycemia increases during and after exercise. Common symptoms of hypoglycemia can include:  Hunger.  Anxiety.  Sweating and feeling clammy.  Confusion.  Dizziness or feeling light-headed.  Increased heart rate or palpitations.  Blurry vision.  Tingling or numbness around the mouth,  lips, or tongue.  Tremors or shakes.  Irritability.  Keep a rapid-acting carbohydrate snack available before, during, and after exercise to help prevent or treat hypoglycemia.  Avoid injecting insulin into areas of the body that are going to be exercised. For example, avoid injecting insulin into:  The arms, when playing tennis.  The legs, when jogging.  Keep records of your exercise habits. Doing this can help you and your health care provider adjust your diabetes management plan as needed. Write down:  Food that you eat before and after you exercise.  Blood glucose levels before and after you exercise.  The type and amount of exercise you have done.  When your insulin is expected to peak, if you use insulin. Avoid exercising at times when your insulin is peaking.  When you start a new exercise or activity, work with your health care provider to make sure the activity is safe for you, and to adjust your insulin, medicines, or food intake as needed.  Drink plenty of water while you exercise to prevent dehydration or heat stroke. Drink enough fluid to keep your urine clear or pale yellow. This information is not intended to replace advice given to you by your health care provider. Make sure you discuss any questions you have with your health care provider. Document Released: 01/01/2004 Document Revised: 04/30/2016 Document Reviewed: 03/22/2016 Elsevier Interactive Patient Education  2017 [employer]

## 2016-09-29 LAB — HIV ANTIBODY (ROUTINE TESTING W REFLEX): HIV 1&2 Ab, 4th Generation: NONREACTIVE

## 2016-10-04 ENCOUNTER — Telehealth: Payer: Self-pay

## 2016-10-04 NOTE — Telephone Encounter (Signed)
Contacted pt to go over lab results pt is aware of results and doesn't have any questions or concers

## 2016-10-11 ENCOUNTER — Ambulatory Visit: Payer: Self-pay | Attending: Internal Medicine

## 2016-10-27 MED FILL — METOPROLOL TARTRATE 25 MG T: 25 | 30 days supply | Qty: 60 | Fill #0

## 2016-10-27 MED FILL — ?AMLODIPINE BESYLATE 10 MG: 10 | 30 days supply | Qty: 30 | Fill #0

## 2016-10-27 MED FILL — ATORVASTATIN 80 MG TABLET: 80 | 30 days supply | Qty: 30 | Fill #0

## 2016-10-27 MED FILL — ?LISINOPRIL 40 MG TABLET: 40 MG | 30 days supply | Qty: 30 | Fill #0

## 2016-11-01 MED FILL — metFORMIN HCL 500 MG TABS: 500 | 30 days supply | Qty: 60 | Fill #1

## 2016-11-29 MED FILL — metFORMIN HCL 500 MG TABS: 500 | 30 days supply | Qty: 60 | Fill #2

## 2016-11-29 MED FILL — ?LISINOPRIL 40 MG TABLET: 40 MG | 30 days supply | Qty: 30 | Fill #1

## 2016-11-29 MED FILL — AMLODIPINE BESYLATE 10 MG T: 10 | 30 days supply | Qty: 30 | Fill #1

## 2016-11-29 MED FILL — ATORVASTATIN 80 MG TABLET: 80 | 30 days supply | Qty: 30 | Fill #1

## 2016-12-22 MED FILL — metFORMIN HCL 500 MG TABS: 500 | 30 days supply | Qty: 60 | Fill #3

## 2016-12-22 MED FILL — AMLODIPINE BESYLATE 10 MG T: 10 | 30 days supply | Qty: 30 | Fill #2

## 2016-12-22 MED FILL — ATORVASTATIN 80 MG TABLET: 80 | 30 days supply | Qty: 30 | Fill #2

## 2016-12-22 MED FILL — ?LISINOPRIL 40 MG TABLET: 40 MG | 30 days supply | Qty: 30 | Fill #2

## 2016-12-28 ENCOUNTER — Other Ambulatory Visit: Payer: Self-pay | Admitting: Internal Medicine

## 2016-12-28 MED FILL — METOPROLOL TARTRATE 25 MG T: 25 | 30 days supply | Qty: 60 | Fill #1

## 2016-12-29 MED FILL — NAPROXEN 500 MG TABLET: 500 | 15 days supply | Qty: 30 | Fill #0

## 2017-01-18 ENCOUNTER — Other Ambulatory Visit: Payer: Self-pay | Admitting: Internal Medicine

## 2017-01-18 MED FILL — metFORMIN HCL 500 MG TABS: 500 | 30 days supply | Qty: 60 | Fill #0

## 2017-01-18 MED FILL — AMLODIPINE BESYLATE 10 MG T: 10 | 30 days supply | Qty: 30 | Fill #3

## 2017-01-18 MED FILL — ATORVASTATIN 80 MG TABLET: 80 | 30 days supply | Qty: 30 | Fill #3

## 2017-01-18 MED FILL — LISINOPRIL 40 MG TABLET: 40 | 30 days supply | Qty: 30 | Fill #3

## 2017-01-18 MED FILL — METOPROLOL TARTRATE 25 MG T: 25 | 30 days supply | Qty: 60 | Fill #2

## 2017-02-14 ENCOUNTER — Other Ambulatory Visit: Payer: Self-pay | Admitting: Internal Medicine

## 2017-02-14 MED FILL — METOPROLOL TARTRATE 25 MG T: 25 | 30 days supply | Qty: 60 | Fill #3

## 2017-02-14 MED FILL — LISINOPRIL 40 MG TABLET: 40 | 30 days supply | Qty: 30 | Fill #4

## 2017-02-14 MED FILL — ?METFORMIN HCL 500MG TABLET: 500 | 30 days supply | Qty: 60 | Fill #0

## 2017-02-14 MED FILL — ?AMLODIPINE BESYLATE 10 MG: 10 | 30 days supply | Qty: 30 | Fill #4

## 2017-02-14 MED FILL — ATORVASTATIN 80 MG TABLET: 80 | 30 days supply | Qty: 30 | Fill #4

## 2017-03-10 ENCOUNTER — Encounter: Payer: Self-pay | Admitting: Internal Medicine

## 2017-03-11 ENCOUNTER — Encounter: Payer: Self-pay | Admitting: Internal Medicine

## 2017-03-14 ENCOUNTER — Encounter: Payer: Self-pay | Admitting: Internal Medicine

## 2017-03-15 ENCOUNTER — Other Ambulatory Visit: Payer: Self-pay | Admitting: Internal Medicine

## 2017-03-15 ENCOUNTER — Other Ambulatory Visit: Payer: Self-pay | Admitting: Physician Assistant

## 2017-03-15 DIAGNOSIS — I25118 Atherosclerotic heart disease of native coronary artery with other forms of angina pectoris: Secondary | ICD-10-CM

## 2017-03-15 DIAGNOSIS — R0789 Other chest pain: Secondary | ICD-10-CM

## 2017-03-15 MED FILL — ?AMLODIPINE BESYLATE 10 MG: 10 | 30 days supply | Qty: 30 | Fill #5

## 2017-03-15 MED FILL — ATORVASTATIN 80 MG TABLET: 80 | 30 days supply | Qty: 30 | Fill #5

## 2017-03-15 MED FILL — METOPROLOL TARTRATE 25 MG T: 25 | 30 days supply | Qty: 60 | Fill #4

## 2017-03-15 MED FILL — LISINOPRIL 40 MG TABLET: 40 | 30 days supply | Qty: 30 | Fill #5

## 2017-03-15 MED FILL — ?METFORMIN HCL 500MG TABLET: 500 | 30 days supply | Qty: 60 | Fill #0

## 2017-03-15 NOTE — Telephone Encounter (Signed)
Please review for refill. Thanks!  

## 2017-03-16 MED FILL — ATORVASTATIN 80 MG TABLET: 80 | 30 days supply | Qty: 30 | Fill #0

## 2017-04-25 MED FILL — ?AMLODIPINE BESYLATE 10 MG: 10 | 30 days supply | Qty: 30 | Fill #0

## 2017-04-25 MED FILL — ATORVASTATIN 80 MG TABLET: 80 | 30 days supply | Qty: 30 | Fill #1

## 2017-04-25 MED FILL — LISINOPRIL 40 MG TABLET: 40 | 30 days supply | Qty: 30 | Fill #0

## 2017-04-29 MED FILL — ?METOPROLOL 25 MG TABLET: 25 | 30 days supply | Qty: 60 | Fill #5

## 2017-05-06 ENCOUNTER — Ambulatory Visit: Payer: Self-pay | Attending: Family Medicine | Admitting: Family Medicine

## 2017-05-06 ENCOUNTER — Encounter: Payer: Self-pay | Admitting: Family Medicine

## 2017-05-06 VITALS — BP 133/70 | HR 56 | Temp 98.1°F | Resp 18 | Ht 67.0 in | Wt 201.4 lb

## 2017-05-06 DIAGNOSIS — R0789 Other chest pain: Secondary | ICD-10-CM

## 2017-05-06 DIAGNOSIS — M791 Myalgia: Secondary | ICD-10-CM | POA: Insufficient documentation

## 2017-05-06 DIAGNOSIS — Z0000 Encounter for general adult medical examination without abnormal findings: Secondary | ICD-10-CM

## 2017-05-06 DIAGNOSIS — Z79899 Other long term (current) drug therapy: Secondary | ICD-10-CM | POA: Insufficient documentation

## 2017-05-06 DIAGNOSIS — IMO0001 Reserved for inherently not codable concepts without codable children: Secondary | ICD-10-CM

## 2017-05-06 DIAGNOSIS — Z7984 Long term (current) use of oral hypoglycemic drugs: Secondary | ICD-10-CM | POA: Insufficient documentation

## 2017-05-06 DIAGNOSIS — Z7982 Long term (current) use of aspirin: Secondary | ICD-10-CM | POA: Insufficient documentation

## 2017-05-06 DIAGNOSIS — I10 Essential (primary) hypertension: Secondary | ICD-10-CM

## 2017-05-06 DIAGNOSIS — R5383 Other fatigue: Secondary | ICD-10-CM

## 2017-05-06 DIAGNOSIS — E119 Type 2 diabetes mellitus without complications: Secondary | ICD-10-CM | POA: Insufficient documentation

## 2017-05-06 DIAGNOSIS — E1165 Type 2 diabetes mellitus with hyperglycemia: Secondary | ICD-10-CM

## 2017-05-06 DIAGNOSIS — M7918 Myalgia, other site: Secondary | ICD-10-CM

## 2017-05-06 LAB — GLUCOSE, POCT (MANUAL RESULT ENTRY): POC GLUCOSE: 129 mg/dL — AB (ref 70–99)

## 2017-05-06 LAB — POCT GLYCOSYLATED HEMOGLOBIN (HGB A1C): Hemoglobin A1C: 7.5

## 2017-05-06 MED ORDER — IBUPROFEN 600 MG PO TABS: 600.0000 mg | ORAL_TABLET | Freq: Four times a day (QID) | ORAL | 0 refills | Status: DC | PRN

## 2017-05-06 MED ORDER — IBUPROFEN 600 MG PO TABS
600.0000 mg | ORAL_TABLET | Freq: Four times a day (QID) | ORAL | 0 refills | Status: DC | PRN
Start: 2017-05-06 — End: 2017-05-06

## 2017-05-06 MED ORDER — METFORMIN HCL 1000 MG PO TABS: ORAL_TABLET | ORAL | 2 refills | Status: DC

## 2017-05-06 MED ORDER — LISINOPRIL 40 MG PO TABS: 40.0000 mg | ORAL_TABLET | Freq: Every day | ORAL | 2 refills | Status: DC

## 2017-05-06 MED ORDER — METOPROLOL TARTRATE 25 MG PO TABS: ORAL_TABLET | ORAL | 2 refills | Status: DC

## 2017-05-06 MED ORDER — PNEUMOCOCCAL 13-VAL CONJ VACC IM SUSP: 0.5000 mL | Freq: Once | INTRAMUSCULAR | Status: DC

## 2017-05-06 MED ORDER — ASPIRIN 81 MG PO TBEC: 81.0000 mg | DELAYED_RELEASE_TABLET | Freq: Every day | ORAL | 12 refills | Status: DC

## 2017-05-06 MED ORDER — ATORVASTATIN CALCIUM 80 MG PO TABS: 80.0000 mg | ORAL_TABLET | Freq: Every day | ORAL | 2 refills | Status: DC

## 2017-05-06 MED ORDER — AMLODIPINE BESYLATE 10 MG PO TABS: 10.0000 mg | ORAL_TABLET | Freq: Every day | ORAL | 2 refills | Status: DC

## 2017-05-06 MED FILL — ?METFORMIN HCL 1,000 MG TAB: 1000 | 30 days supply | Qty: 60 | Fill #0

## 2017-05-06 MED FILL — IBUPROFEN 600 MG TABLET: 600 | 7 days supply | Qty: 30 | Fill #0

## 2017-05-06 NOTE — Patient Instructions (Signed)
DASH Eating Plan DASH stands for "Dietary Approaches to Stop Hypertension." The DASH eating plan is a healthy eating plan that has been shown to reduce high blood pressure (hypertension). It may also reduce your risk for type 2 diabetes, heart disease, and stroke. The DASH eating plan may also help with weight loss. What are tips for following this plan? General guidelines  Avoid eating more than 2,300 mg (milligrams) of salt (sodium) a day. If you have hypertension, you may need to reduce your sodium intake to 1,500 mg a day.  Limit alcohol intake to no more than 1 drink a day for nonpregnant women and 2 drinks a day for men. One drink equals 12 oz of beer, 5 oz of wine, or 1 oz of hard liquor.  Work with your health care provider to maintain a healthy body weight or to lose weight. Ask what an ideal weight is for you.  Get at least 30 minutes of exercise that causes your heart to beat faster (aerobic exercise) most days of the week. Activities may include walking, swimming, or biking.  Work with your health care provider or diet and nutrition specialist (dietitian) to adjust your eating plan to your individual calorie needs. Reading food labels  Check food labels for the amount of sodium per serving. Choose foods with less than 5 percent of the Daily Value of sodium. Generally, foods with less than 300 mg of sodium per serving fit into this eating plan.  To find whole grains, look for the word "whole" as the first word in the ingredient list. Shopping  Buy products labeled as "low-sodium" or "no salt added."  Buy fresh foods. Avoid canned foods and premade or frozen meals. Cooking  Avoid adding salt when cooking. Use salt-free seasonings or herbs instead of table salt or sea salt. Check with your health care provider or pharmacist before using salt substitutes.  Do not fry foods. Cook foods using healthy methods such as baking, boiling, grilling, and broiling instead.  Cook with  heart-healthy oils, such as olive, canola, soybean, or sunflower oil. Meal planning   Eat a balanced diet that includes: ? 5 or more servings of fruits and vegetables each day. At each meal, try to fill half of your plate with fruits and vegetables. ? Up to 6-8 servings of whole grains each day. ? Less than 6 oz of lean meat, poultry, or fish each day. A 3-oz serving of meat is about the same size as a deck of cards. One egg equals 1 oz. ? 2 servings of low-fat dairy each day. ? A serving of nuts, seeds, or beans 5 times each week. ? Heart-healthy fats. Healthy fats called Omega-3 fatty acids are found in foods such as flaxseeds and coldwater fish, like sardines, salmon, and mackerel.  Limit how much you eat of the following: ? Canned or prepackaged foods. ? Food that is high in trans fat, such as fried foods. ? Food that is high in saturated fat, such as fatty meat. ? Sweets, desserts, sugary drinks, and other foods with added sugar. ? Full-fat dairy products.  Do not salt foods before eating.  Try to eat at least 2 vegetarian meals each week.  Eat more home-cooked food and less restaurant, buffet, and fast food.  When eating at a restaurant, ask that your food be prepared with less salt or no salt, if possible. What foods are recommended? The items listed may not be a complete list. Talk with your dietitian about what   dietary choices are best for you. Grains Whole-grain or whole-wheat bread. Whole-grain or whole-wheat pasta. Brown rice. Oatmeal. Quinoa. Bulgur. Whole-grain and low-sodium cereals. Pita bread. Low-fat, low-sodium crackers. Whole-wheat flour tortillas. Vegetables Fresh or frozen vegetables (raw, steamed, roasted, or grilled). Low-sodium or reduced-sodium tomato and vegetable juice. Low-sodium or reduced-sodium tomato sauce and tomato paste. Low-sodium or reduced-sodium canned vegetables. Fruits All fresh, dried, or frozen fruit. Canned fruit in natural juice (without  added sugar). Meat and other protein foods Skinless chicken or turkey. Ground chicken or turkey. Pork with fat trimmed off. Fish and seafood. Egg whites. Dried beans, peas, or lentils. Unsalted nuts, nut butters, and seeds. Unsalted canned beans. Lean cuts of beef with fat trimmed off. Low-sodium, lean deli meat. Dairy Low-fat (1%) or fat-free (skim) milk. Fat-free, low-fat, or reduced-fat cheeses. Nonfat, low-sodium ricotta or cottage cheese. Low-fat or nonfat yogurt. Low-fat, low-sodium cheese. Fats and oils Soft margarine without trans fats. Vegetable oil. Low-fat, reduced-fat, or light mayonnaise and salad dressings (reduced-sodium). Canola, safflower, olive, soybean, and sunflower oils. Avocado. Seasoning and other foods Herbs. Spices. Seasoning mixes without salt. Unsalted popcorn and pretzels. Fat-free sweets. What foods are not recommended? The items listed may not be a complete list. Talk with your dietitian about what dietary choices are best for you. Grains Baked goods made with fat, such as croissants, muffins, or some breads. Dry pasta or rice meal packs. Vegetables Creamed or fried vegetables. Vegetables in a cheese sauce. Regular canned vegetables (not low-sodium or reduced-sodium). Regular canned tomato sauce and paste (not low-sodium or reduced-sodium). Regular tomato and vegetable juice (not low-sodium or reduced-sodium). Pickles. Olives. Fruits Canned fruit in a light or heavy syrup. Fried fruit. Fruit in cream or butter sauce. Meat and other protein foods Fatty cuts of meat. Ribs. Fried meat. Bacon. Sausage. Bologna and other processed lunch meats. Salami. Fatback. Hotdogs. Bratwurst. Salted nuts and seeds. Canned beans with added salt. Canned or smoked fish. Whole eggs or egg yolks. Chicken or turkey with skin. Dairy Whole or 2% milk, cream, and half-and-half. Whole or full-fat cream cheese. Whole-fat or sweetened yogurt. Full-fat cheese. Nondairy creamers. Whipped toppings.  Processed cheese and cheese spreads. Fats and oils Butter. Stick margarine. Lard. Shortening. Ghee. Bacon fat. Tropical oils, such as coconut, palm kernel, or palm oil. Seasoning and other foods Salted popcorn and pretzels. Onion salt, garlic salt, seasoned salt, table salt, and sea salt. Worcestershire sauce. Tartar sauce. Barbecue sauce. Teriyaki sauce. Soy sauce, including reduced-sodium. Steak sauce. Canned and packaged gravies. Fish sauce. Oyster sauce. Cocktail sauce. Horseradish that you find on the shelf. Ketchup. Mustard. Meat flavorings and tenderizers. Bouillon cubes. Hot sauce and Tabasco sauce. Premade or packaged marinades. Premade or packaged taco seasonings. Relishes. Regular salad dressings. Where to find more information:  National Heart, Lung, and Blood Institute: www.nhlbi.nih.gov  American Heart Association: www.heart.org Summary  The DASH eating plan is a healthy eating plan that has been shown to reduce high blood pressure (hypertension). It may also reduce your risk for type 2 diabetes, heart disease, and stroke.  With the DASH eating plan, you should limit salt (sodium) intake to 2,300 mg a day. If you have hypertension, you may need to reduce your sodium intake to 1,500 mg a day.  When on the DASH eating plan, aim to eat more fresh fruits and vegetables, whole grains, lean proteins, low-fat dairy, and heart-healthy fats.  Work with your health care provider or diet and nutrition specialist (dietitian) to adjust your eating plan to your individual   calorie needs. This information is not intended to replace advice given to you by your health care provider. Make sure you discuss any questions you have with your health care provider. Document Released: 09/30/2011 Document Revised: 10/04/2016 Document Reviewed: 10/04/2016 Elsevier Interactive Patient Education  2017 [employer]  

## 2017-05-06 NOTE — Progress Notes (Signed)
Patient is here for f/up HTN DM  

## 2017-05-06 NOTE — Progress Notes (Signed)
 Subjective:  Patient ID: Bradley G Wenz Jr., male    DOB: 12/12/1970  Age: 45 y.o. MRN: [id number]  CC: Diabetes and Hypertension   HPI Bradley G Hansell Jr. presents for for follow up of diabetes. Symptoms: none. Patient denies foot ulcerations, nausea, paresthesia of the feet, polydipsia, polyuria, visual disturbances and vomitting.  Evaluation to date has been included: fasting blood sugar, fasting lipid panel and hemoglobin A1C.  Home sugars: patient does not check sugars. Treatment to date:metfromin.History of hypertension. He is not exercising and is not adherent to low salt diet.  He does not check BP at home. Cardiac symptoms none. He does report a history of atypical chest pain with nitroglycerin use. Patient denies chest pain, chest pressure/discomfort, claudication, dyspnea, lower extremity edema, near-syncope, orthopnea, palpitations and syncope.  Cardiovascular risk factors: diabetes mellitus, dyslipidemia, hypertension, male gender and sedentary lifestyle. Use of agents associated with hypertension: none. History of target organ damage: angina/ prior myocardial infarction. He does report intermittent episodes of generalized fatigue and muscle aches. He denies any other constitutional symptoms, changes to bowel or bladder habits, or injury. Denies taking anything for symptoms.   Outpatient Medications Prior to Visit  Medication Sig Dispense Refill  . Blood Glucose Monitoring Suppl (TRUE METRIX GO GLUCOSE METER) w/Device KIT 1 each by Does not apply route every 8 (eight) hours as needed. 1 kit 0  . Coal Tar Extract (PSORIASIN EX) Apply 1 application topically as needed (for psoriasis).    . glucose blood test strip Use as instructed 100 each 12  . predniSONE (DELTASONE) 20 MG tablet Take 3 tablets (60 mg total) by mouth daily with breakfast. Day 1-3, take 60mg (=3 tabs) qam, day 4-7 take 40mg qam (=2 tabs), day 8-10 take 1 tab daily, than stop 20 tablet 0  . TRUEPLUS LANCETS 26G  MISC 1 each by Does not apply route every 8 (eight) hours as needed. 100 each 12  . amLODipine (NORVASC) 10 MG tablet TAKE 1 TABLET BY MOUTH DAILY. 30 tablet 2  . aspirin 81 MG EC tablet Take 1 tablet (81 mg total) by mouth daily. 30 tablet 6  . atorvastatin (LIPITOR) 80 MG tablet TAKE 1 TABLET BY MOUTH DAILY. 30 tablet 2  . ibuprofen (ADVIL,MOTRIN) 600 MG tablet Take 1 tablet (600 mg total) by mouth every 6 (six) hours as needed. 30 tablet 0  . lisinopril (PRINIVIL,ZESTRIL) 40 MG tablet TAKE 1 TABLET BY MOUTH DAILY. 30 tablet 2  . metFORMIN (GLUCOPHAGE) 500 MG tablet TAKE 1 TABLET BY MOUTH 2 TIMES DAILY WITH A MEAL. 60 tablet 0  . metoprolol tartrate (LOPRESSOR) 25 MG tablet TAKE ONE TABLET BY MOUTH TWICE DAILY 60 tablet 6  . naproxen (NAPROSYN) 500 MG tablet TAKE 1 TABLET BY MOUTH 2 TIMES DAILY WITH A MEAL. 30 tablet 0  . nitroGLYCERIN (NITROSTAT) 0.4 MG SL tablet DISSOLVE ONE TABLET UNDER THE TONGUE EVERY 5 MINUTES AS NEEDED FOR CHEST PAIN.  DO NOT EXCEED A TOTAL OF 3 DOSES IN 15 MINUTES 25 tablet 5   No facility-administered medications prior to visit.     ROS Review of Systems  Constitutional: Positive for fatigue.  Eyes: Negative for visual disturbance.  Respiratory: Negative.   Cardiovascular: Negative.   Gastrointestinal: Negative.   Musculoskeletal: Positive for myalgias.  Skin: Negative.   Neurological: Negative.     Objective:  BP 133/70 (BP Location: Left Arm, Patient Position: Sitting, Cuff Size: Normal)   Pulse (!) 56   Temp 98.1 F (36.7   C) (Oral)   Resp 18   Ht 5' 7" (1.702 m)   Wt 201 lb 6.4 oz (91.4 kg)   SpO2 100%   BMI 31.54 kg/m   BP/Weight 05/06/2017 09/28/2016 07/14/2016  Systolic BP 133 144 127  Diastolic BP 70 79 69  Wt. (Lbs) 201.4 211.4 213.4  BMI 31.54 32.14 32.45   Physical Exam  Constitutional: He is oriented to person, place, and time. He appears well-developed and well-nourished.  Eyes: Pupils are equal, round, and reactive to light.  Conjunctivae are normal.  Neck: Normal range of motion. Neck supple. No JVD present.  Cardiovascular: Normal rate, regular rhythm, normal heart sounds and intact distal pulses.   Pulmonary/Chest: Effort normal and breath sounds normal.  Abdominal: Soft. Bowel sounds are normal. There is no tenderness.  Musculoskeletal: Normal range of motion.  Neurological: He is alert and oriented to person, place, and time.  Skin: Skin is warm and dry.  Nursing note and vitals reviewed.  Diabetic Foot Exam - Simple   Simple Foot Form Diabetic Foot exam was performed with the following findings:  Yes 05/06/2017  2:10 PM  Visual Inspection No deformities, no ulcerations, no other skin breakdown bilaterally:  Yes Sensation Testing Intact to touch and monofilament testing bilaterally:  Yes Pulse Check Posterior Tibialis and Dorsalis pulse intact bilaterally:  Yes Comments     Assessment & Plan:   Problem List Items Addressed This Visit      Cardiovascular and Mediastinum   Hypertension   Relevant Medications   metoprolol tartrate (LOPRESSOR) 25 MG tablet   amLODipine (NORVASC) 10 MG tablet   lisinopril (PRINIVIL,ZESTRIL) 40 MG tablet   aspirin 81 MG EC tablet   atorvastatin (LIPITOR) 80 MG tablet   Other Relevant Orders   Lipid Panel (Completed)    Other Visit Diagnoses    Uncontrolled type 2 diabetes mellitus without complication, without long-term current use of insulin (HCC)    -  Primary   Check CBGs twice a day and bring glucometer or log to next office visit.    Follow-up with PCP in 8 weeks.    Relevant Medications   lisinopril (PRINIVIL,ZESTRIL) 40 MG tablet   metFORMIN (GLUCOPHAGE) 1000 MG tablet   aspirin 81 MG EC tablet   atorvastatin (LIPITOR) 80 MG tablet   Other Relevant Orders   POCT glycosylated hemoglobin (Hb A1C) (Completed)   Glucose (CBG) (Completed)   Lipid Panel (Completed)   Atypical chest pain       History of atypical chest pain, stable.  Denies any worsening  of symptoms.   Chronic nitroglycerin use.   Healthcare maintenance       Relevant Medications   pneumococcal 13-valent conjugate vaccine (PREVNAR 13) injection 0.5 mL   Musculoskeletal pain       Relevant Medications   ibuprofen (ADVIL,MOTRIN) 600 MG tablet   Fatigue, unspecified type       Relevant Orders   TSH (Completed)   CBC with Differential (Completed)      Meds ordered this encounter  Medications  . pneumococcal 13-valent conjugate vaccine (PREVNAR 13) injection 0.5 mL  . metoprolol tartrate (LOPRESSOR) 25 MG tablet    Sig: TAKE ONE TABLET BY MOUTH TWICE DAILY    Dispense:  30 tablet    Refill:  2    Order Specific Question:   Supervising Provider    Answer:   JEGEDE, OLUGBEMIGA E [[id number]]  . amLODipine (NORVASC) 10 MG tablet    Sig: Take 1 tablet (10   mg total) by mouth daily.    Dispense:  30 tablet    Refill:  2    Order Specific Question:   Supervising Provider    Answer:   JEGEDE, OLUGBEMIGA E [[id number]]  . lisinopril (PRINIVIL,ZESTRIL) 40 MG tablet    Sig: Take 1 tablet (40 mg total) by mouth daily.    Dispense:  30 tablet    Refill:  2    Order Specific Question:   Supervising Provider    Answer:   JEGEDE, OLUGBEMIGA E [[id number]]  . metFORMIN (GLUCOPHAGE) 1000 MG tablet    Sig: TAKE 1 TABLET BY MOUTH 2 TIMES DAILY WITH A MEAL.    Dispense:  60 tablet    Refill:  2    Order Specific Question:   Supervising Provider    Answer:   JEGEDE, OLUGBEMIGA E [[id number]]  . aspirin 81 MG EC tablet    Sig: Take 1 tablet (81 mg total) by mouth daily.    Dispense:  30 tablet    Refill:  12    Order Specific Question:   Supervising Provider    Answer:   JEGEDE, OLUGBEMIGA E [[id number]]  . DISCONTD: ibuprofen (ADVIL,MOTRIN) 600 MG tablet    Sig: Take 1 tablet (600 mg total) by mouth every 6 (six) hours as needed.    Dispense:  30 tablet    Refill:  0    Order Specific Question:   Supervising Provider    Answer:   JEGEDE, OLUGBEMIGA E [[id number]]  . atorvastatin (LIPITOR)  80 MG tablet    Sig: Take 1 tablet (80 mg total) by mouth daily.    Dispense:  30 tablet    Refill:  2    Order Specific Question:   Supervising Provider    Answer:   JEGEDE, OLUGBEMIGA E [[id number]]  . ibuprofen (ADVIL,MOTRIN) 600 MG tablet    Sig: Take 1 tablet (600 mg total) by mouth every 6 (six) hours as needed.    Dispense:  30 tablet    Refill:  0    Order Specific Question:   Supervising Provider    Answer:   JEGEDE, OLUGBEMIGA E [[id number]]    Follow-up: Return in about 8 weeks (around 07/01/2017) for HTN/DM.   Mandesia R Hairston FNP    

## 2017-05-07 LAB — CBC WITH DIFFERENTIAL/PLATELET
BASOS ABS: 0 10*3/uL (ref 0.0–0.2)
Basos: 1 %
EOS (ABSOLUTE): 0.1 10*3/uL (ref 0.0–0.4)
Eos: 2 %
Hematocrit: 37.9 % (ref 37.5–51.0)
Hemoglobin: 13.4 g/dL (ref 13.0–17.7)
Immature Grans (Abs): 0 10*3/uL (ref 0.0–0.1)
Immature Granulocytes: 0 %
LYMPHS ABS: 3.3 10*3/uL — AB (ref 0.7–3.1)
Lymphs: 54 %
MCH: 29.4 pg (ref 26.6–33.0)
MCHC: 35.4 g/dL (ref 31.5–35.7)
MCV: 83 fL (ref 79–97)
MONOS ABS: 0.5 10*3/uL (ref 0.1–0.9)
Monocytes: 8 %
NEUTROS ABS: 2.1 10*3/uL (ref 1.4–7.0)
Neutrophils: 35 %
Platelets: 231 10*3/uL (ref 150–379)
RBC: 4.56 x10E6/uL (ref 4.14–5.80)
RDW: 13.6 % (ref 12.3–15.4)
WBC: 6.1 10*3/uL (ref 3.4–10.8)

## 2017-05-07 LAB — LIPID PANEL
CHOL/HDL RATIO: 2.3 ratio (ref 0.0–5.0)
CHOLESTEROL TOTAL: 108 mg/dL (ref 100–199)
HDL: 47 mg/dL (ref >39)
LDL CALC: 31 mg/dL (ref 0–99)
TRIGLYCERIDES: 148 mg/dL (ref 0–149)
VLDL Cholesterol Cal: 30 mg/dL (ref 5–40)

## 2017-05-07 LAB — TSH: TSH: 1.31 u[IU]/mL (ref 0.450–4.500)

## 2017-05-09 ENCOUNTER — Telehealth: Payer: Self-pay

## 2017-05-09 ENCOUNTER — Other Ambulatory Visit: Payer: Self-pay | Admitting: Family Medicine

## 2017-05-09 NOTE — Telephone Encounter (Signed)
-----   Message from Lizbeth BarkMandesia R Hairston, FNP sent at 05/09/2017 11:16 AM EDT ----- Thyroid function normal. When thyroid function is decreased it can cause fatigue. Labs that evaluated your blood cells showed no signs of anemia present. Anemia can cause fatigue and weakness.  Cholesterol levels are normal. Continue taking atorvastatin.

## 2017-05-09 NOTE — Telephone Encounter (Signed)
CMA call regarding lab results   Patient did not answer but left a VM stating the reason of the call & to call back  

## 2017-05-10 ENCOUNTER — Other Ambulatory Visit: Payer: Self-pay | Admitting: Family Medicine

## 2017-05-10 ENCOUNTER — Telehealth: Payer: Self-pay | Admitting: Family Medicine

## 2017-05-10 DIAGNOSIS — I25118 Atherosclerotic heart disease of native coronary artery with other forms of angina pectoris: Secondary | ICD-10-CM

## 2017-05-10 MED ORDER — NITROGLYCERIN 0.4 MG SL SUBL: SUBLINGUAL_TABLET | SUBLINGUAL | 1 refills | Status: DC

## 2017-05-10 NOTE — Telephone Encounter (Signed)
Pt came in requesting a refill of his nitroglycerin. Please f/u with pt. Thank you.

## 2017-05-11 NOTE — Telephone Encounter (Signed)
CMA call regarding medication refill   Patient did not answer but left a VM stating the reason of the call & to call if have any questions

## 2017-05-11 NOTE — Telephone Encounter (Signed)
Refill for medication sent to pharmacy on file.

## 2017-05-23 ENCOUNTER — Other Ambulatory Visit: Payer: Self-pay | Admitting: Family Medicine

## 2017-05-25 ENCOUNTER — Other Ambulatory Visit: Payer: Self-pay | Admitting: Physician Assistant

## 2017-05-25 DIAGNOSIS — I10 Essential (primary) hypertension: Secondary | ICD-10-CM

## 2017-05-26 NOTE — Telephone Encounter (Signed)
Please review for refill, Thanks !  

## 2017-05-30 ENCOUNTER — Other Ambulatory Visit: Payer: Self-pay | Admitting: Family Medicine

## 2017-05-30 DIAGNOSIS — M7918 Myalgia, other site: Secondary | ICD-10-CM

## 2017-05-30 MED FILL — AMLODIPINE BESYLATE 10 MG T: 10 | 30 days supply | Qty: 30 | Fill #0

## 2017-05-30 MED FILL — ?METFORMIN HCL 1,000 MG TAB: 1000 | 30 days supply | Qty: 60 | Fill #1

## 2017-05-30 MED FILL — ATORVASTATIN 80 MG TABLET: 80 | 30 days supply | Qty: 30 | Fill #0

## 2017-05-30 MED FILL — METOPROLOL TARTRATE 25 MG T: 25 | 15 days supply | Qty: 30 | Fill #0

## 2017-05-30 MED FILL — LISINOPRIL 40 MG TABLET: 40 | 30 days supply | Qty: 30 | Fill #0

## 2017-05-30 MED FILL — IBUPROFEN 600 MG TABLET: 600 | 7 days supply | Qty: 30 | Fill #0

## 2017-06-16 MED FILL — METOPROLOL TARTRATE 25 MG T: 25 | 15 days supply | Qty: 30 | Fill #1

## 2017-07-01 ENCOUNTER — Encounter: Payer: Self-pay | Admitting: Family Medicine

## 2017-07-01 ENCOUNTER — Ambulatory Visit: Payer: Self-pay | Attending: Family Medicine | Admitting: Family Medicine

## 2017-07-01 VITALS — BP 123/75 | HR 55 | Temp 98.7°F | Resp 18 | Ht 68.0 in | Wt 197.8 lb

## 2017-07-01 DIAGNOSIS — E785 Hyperlipidemia, unspecified: Secondary | ICD-10-CM

## 2017-07-01 DIAGNOSIS — E119 Type 2 diabetes mellitus without complications: Secondary | ICD-10-CM | POA: Insufficient documentation

## 2017-07-01 DIAGNOSIS — Z760 Encounter for issue of repeat prescription: Secondary | ICD-10-CM

## 2017-07-01 DIAGNOSIS — E1165 Type 2 diabetes mellitus with hyperglycemia: Secondary | ICD-10-CM | POA: Insufficient documentation

## 2017-07-01 DIAGNOSIS — M7521 Bicipital tendinitis, right shoulder: Secondary | ICD-10-CM

## 2017-07-01 DIAGNOSIS — Z7984 Long term (current) use of oral hypoglycemic drugs: Secondary | ICD-10-CM | POA: Insufficient documentation

## 2017-07-01 DIAGNOSIS — I10 Essential (primary) hypertension: Secondary | ICD-10-CM | POA: Insufficient documentation

## 2017-07-01 DIAGNOSIS — E1140 Type 2 diabetes mellitus with diabetic neuropathy, unspecified: Secondary | ICD-10-CM

## 2017-07-01 DIAGNOSIS — Z79899 Other long term (current) drug therapy: Secondary | ICD-10-CM | POA: Insufficient documentation

## 2017-07-01 DIAGNOSIS — Z7982 Long term (current) use of aspirin: Secondary | ICD-10-CM | POA: Insufficient documentation

## 2017-07-01 DIAGNOSIS — IMO0001 Reserved for inherently not codable concepts without codable children: Secondary | ICD-10-CM

## 2017-07-01 LAB — GLUCOSE, POCT (MANUAL RESULT ENTRY): POC GLUCOSE: 90 mg/dL (ref 70–99)

## 2017-07-01 MED ORDER — LISINOPRIL 40 MG PO TABS: 40.0000 mg | ORAL_TABLET | Freq: Every day | ORAL | 0 refills | Status: DC

## 2017-07-01 MED ORDER — ATORVASTATIN CALCIUM 80 MG PO TABS: 80.0000 mg | ORAL_TABLET | Freq: Every day | ORAL | 0 refills | Status: DC

## 2017-07-01 MED ORDER — NAPROXEN 500 MG PO TABS: 500.0000 mg | ORAL_TABLET | Freq: Two times a day (BID) | ORAL | 0 refills | Status: DC

## 2017-07-01 MED ORDER — GABAPENTIN 300 MG PO CAPS: 300.0000 mg | ORAL_CAPSULE | Freq: Every day | ORAL | 0 refills | Status: DC

## 2017-07-01 MED ORDER — AMLODIPINE BESYLATE 10 MG PO TABS: 10.0000 mg | ORAL_TABLET | Freq: Every day | ORAL | 0 refills | Status: DC

## 2017-07-01 MED ORDER — METFORMIN HCL 1000 MG PO TABS: ORAL_TABLET | ORAL | 0 refills | Status: DC

## 2017-07-01 MED ORDER — METOPROLOL TARTRATE 25 MG PO TABS: ORAL_TABLET | ORAL | 0 refills | Status: DC

## 2017-07-01 MED ORDER — ACETAMINOPHEN 500 MG PO TABS: 1000.0000 mg | ORAL_TABLET | Freq: Three times a day (TID) | ORAL | 0 refills | Status: DC | PRN

## 2017-07-01 MED FILL — LISINOPRIL 40 MG TABLET: 40 | 30 days supply | Qty: 30 | Fill #0

## 2017-07-01 MED FILL — GABAPENTIN 300 MG CAPSULE: 300 | 30 days supply | Qty: 30 | Fill #0

## 2017-07-01 MED FILL — NAPROXEN 500 MG TABLET: 500 | 15 days supply | Qty: 30 | Fill #0

## 2017-07-01 MED FILL — AMLODIPINE BESYLATE 10 MG T: 10 | 30 days supply | Qty: 30 | Fill #0

## 2017-07-01 MED FILL — ATORVASTATIN 80 MG TABLET: 80 | 30 days supply | Qty: 30 | Fill #0

## 2017-07-01 MED FILL — ?METFORMIN HCL 1,000 MG TAB: 1000 | 30 days supply | Qty: 60 | Fill #0

## 2017-07-01 NOTE — Patient Instructions (Addendum)
Biceps Tendon Tendinitis (Distal) Distal biceps tendon tendinitis is inflammation of the distal biceps tendon. The distal biceps tendon is a strong cord of tissue that connects the biceps muscle, on the front of the upper arm, to a bone (radius) in the elbow. Distal biceps tendon tendinitis can interfere with the ability to bend the elbow and turn the hand palm-up (supination). This condition is usually caused by overusing the elbow joint and the biceps muscle, and it usually heals within 6 weeks. Distal biceps tendon tendinitis may include a grade 1 or grade 2 strain of the tendon. A grade 1 strain is mild, and it involves a slight pull of the tendon without any stretching or noticeable tearing of the tendon. There is usually no loss of biceps muscle strength. A grade 2 strain is moderate, and it involves a small tear in the tendon. The tendon is stretched, and biceps muscle strength is usually decreased. What are the causes? This condition may be caused by:  A sudden increase in the frequency or intensity of activity that involves the elbow and the biceps muscle.  Overuse of the biceps muscle. This can happen when you do the same movements over and over, such as: ? Supination. ? Forceful straightening (hyperextension) of the elbow. ? Bending of the elbow.  A direct, forceful hit or injury (trauma) to the elbow. This is rare.  What increases the risk? The following factors may make you more likely to develop this condition:  Playing contact sports.  Playing sports that involve throwing and overhead movements, including racket sports, gymnastics, weight lifting, or bodybuilding.  Doing physical labor.  Having poor strength and flexibility of the arm and shoulder.  Having injured other parts of the elbow.  What are the signs or symptoms? Symptoms of this condition may include:  Pain and inflammation in the front of the elbow. Pain may get worse during certain movements, such  as: ? Supination. ? Bending the elbow. ? Lifting or carrying objects. ? Throwing.  A feeling of warmth in the front of the elbow.  A crackling sound (crepitation) when you move or touch the elbow or the upper arm.  In some cases, symptoms may return (recur) after treatment, and they may be long-lasting (chronic). How is this diagnosed? This condition is diagnosed based on your symptoms, your medical history, and a physical exam. You may have tests, including X-rays or MRIs. Your health care provider may test your range of motion by having you do arm movements. How is this treated? This condition is treated by resting and icing the injured area, and by doing physical therapy exercises. Depending on the severity of your condition, treatment may also include:  Medicines to help relieve pain and inflammation.  Ultrasound therapy. This is the application of sound waves to the injured area.  Follow these instructions at home: Managing pain, stiffness, and swelling  If directed, put ice on the injured area: ? Put ice in a plastic bag. ? Place a towel between your skin and the bag. ? Leave the ice on for 20 minutes, 2-3 times a day.  Move your fingers often to avoid stiffness and to lessen swelling.  Raise (elevate) the injured area above the level of your heart while you are sitting or lying down.  If directed, apply heat to the affected area before you exercise. Use the heat source that your health care provider recommends, such as a moist heat pack or a heating pad. ? Place a towel between   your skin and the heat source. ? Leave the heat on for 20-30 minutes. ? Remove the heat if your skin turns bright red. This is especially important if you are unable to feel pain, heat, or cold. You may have a greater risk of getting burned. Activity  Return to your normal activities as told by your health care provider. Ask your health care provider what activities are safe for you.  Do not lift  anything that is heavier than 10 lb (4.5 kg) until your health care provider tells you that it is safe.  Avoid activities that cause pain or make your condition worse.  Do exercises as told by your health care provider. General instructions  Take over-the-counter and prescription medicines only as told by your health care provider.  Do not drive or operate heavy machinery while taking prescription pain medicines.  Keep all follow-up visits as told by your health care provider. This is important. How is this prevented?  Warm up and stretch before being active.  Cool down and stretch after being active.  Give your body time to rest between periods of activity.  Make sure to use equipment that fits you.  Be safe and responsible while being active to avoid falls.  Do at least 150 minutes of moderate-intensity exercise each week, such as brisk walking or water aerobics.  Maintain physical fitness, including: ? Strength. ? Flexibility. ? Cardiovascular fitness. ? Endurance. Contact a health care provider if:  You have symptoms that get worse or do not get better after 2 weeks of treatment.  You develop new symptoms. Get help right away if:  You develop severe pain. This information is not intended to replace advice given to you by your health care provider. Make sure you discuss any questions you have with your health care provider. Document Released: 10/11/2005 Document Revised: 06/17/2016 Document Reviewed: 09/19/2015 Elsevier Interactive Patient Education  2018 [employer]      Blood Glucose Monitoring, Adult Monitoring your blood sugar (glucose) helps you manage your diabetes. It also helps you and your health care provider determine how well your diabetes management plan is working. Blood glucose monitoring involves checking your blood glucose as often as directed, and keeping a record (log) of your results over time. Why should I monitor my blood glucose? Checking  your blood glucose regularly can:  Help you understand how food, exercise, illnesses, and medicines affect your blood glucose.  Let you know what your blood glucose is at any time. You can quickly tell if you are having low blood glucose (hypoglycemia) or high blood glucose (hyperglycemia).  Help you and your health care provider adjust your medicines as needed.  When should I check my blood glucose? Follow instructions from your health care provider about how often to check your blood glucose. This may depend on:  The type of diabetes you have.  How well-controlled your diabetes is.  Medicines you are taking.  If you have type 1 diabetes:  Check your blood glucose at least 2 times a day.  Also check your blood glucose: ? Before every insulin injection. ? Before and after exercise. ? Between meals. ? 2 hours after a meal. ? Occasionally between 2:00 a.m. and 3:00 a.m., as directed. ? Before potentially dangerous tasks, like driving or using heavy machinery. ? At bedtime.  You may need to check your blood glucose more often, up to 6-10 times a day: ? If you use an insulin pump. ? If you need multiple daily  injections (MDI). ? If your diabetes is not well-controlled. ? If you are ill. ? If you have a history of severe hypoglycemia. ? If you have a history of not knowing when your blood glucose is getting low (hypoglycemia unawareness). If you have type 2 diabetes:  If you take insulin or other diabetes medicines, check your blood glucose at least 2 times a day.  If you are on intensive insulin therapy, check your blood glucose at least 4 times a day. Occasionally, you may also need to check between 2:00 a.m. and 3:00 a.m., as directed.  Also check your blood glucose: ? Before and after exercise. ? Before potentially dangerous tasks, like driving or using heavy machinery.  You may need to check your blood glucose more often if: ? Your medicine is being adjusted. ? Your  diabetes is not well-controlled. ? You are ill. What is a blood glucose log?  A blood glucose log is a record of your blood glucose readings. It helps you and your health care provider: ? Look for patterns in your blood glucose over time. ? Adjust your diabetes management plan as needed.  Every time you check your blood glucose, write down your result and notes about things that may be affecting your blood glucose, such as your diet and exercise for the day.  Most glucose meters store a record of glucose readings in the meter. Some meters allow you to download your records to a computer. How do I check my blood glucose? Follow these steps to get accurate readings of your blood glucose: Supplies needed   Blood glucose meter.  Test strips for your meter. Each meter has its own strips. You must use the strips that come with your meter.  A needle to prick your finger (lancet). Do not use lancets more than once.  A device that holds the lancet (lancing device).  A journal or log book to write down your results. Procedure  Wash your hands with soap and water.  Prick the side of your finger (not the tip) with the lancet. Use a different finger each time.  Gently rub the finger until a small drop of blood appears.  Follow instructions that come with your meter for inserting the test strip, applying blood to the strip, and using your blood glucose meter.  Write down your result and any notes. Alternative testing sites  Some meters allow you to use areas of your body other than your finger (alternative sites) to test your blood.  If you think you may have hypoglycemia, or if you have hypoglycemia unawareness, do not use alternative sites. Use your finger instead.  Alternative sites may not be as accurate as the fingers, because blood flow is slower in these areas. This means that the result you get may be delayed, and it may be different from the result that you would get from your  finger.  The most common alternative sites are: ? Forearm. ? Thigh. ? Palm of the hand. Additional tips  Always keep your supplies with you.  If you have questions or need help, all blood glucose meters have a 24-hour "hotline" number that you can call. You may also contact your health care provider.  After you use a few boxes of test strips, adjust (calibrate) your blood glucose meter by following instructions that came with your meter. This information is not intended to replace advice given to you by your health care provider. Make sure you discuss any questions you have with  your health care provider. Document Released: 10/14/2003 Document Revised: 04/30/2016 Document Reviewed: 03/22/2016 [employer] Interactive Patient Education  2017 [employer]

## 2017-07-01 NOTE — Progress Notes (Signed)
Subjective:  Patient ID: Bradley Edman., male    DOB: 1971-05-12  Age: 46 y.o. MRN: [id number]  CC: Hypertension and Diabetes   HPI Bradley Baney. presents for for follow up of diabetes. Symptoms: paresthesia of the feet and visual disturbances. Patient denies foot ulcerations, nausea, polydipsia, polyuria and vomitting.  Evaluation to date has been included: fasting blood sugar, fasting lipid panel and hemoglobin A1C.  Home sugars: patient does not check sugars. Treatment to date:metformin. History of hypertension. He is not exercising and is not adherent to low salt diet.  He does not check BP at home. Cardiac symptoms none. He does report a history of atypical chest pain with nitroglycerin use. Patient denies chest pain, chest pressure/discomfort, claudication, dyspnea, lower extremity edema, near-syncope, orthopnea, palpitations and syncope.  Cardiovascular risk factors: diabetes mellitus, dyslipidemia, hypertension, male gender and sedentary lifestyle. Use of agents associated with hypertension: none. History of target organ damage: angina/ prior myocardial infarction. He does report intermittent episodes of generalized fatigue and muscle aches. He denies any other constitutional symptoms, changes to bowel or bladder habits, or injury. Denies taking anything for symptoms. He also complains of chronic myalgias for which has been present for 9 months. Pain is located in right upper arm, is described as aching and sharp, and is intermittent .  Associated symptoms include: difficulty lifting heavy weight.. He denies any generalized headaches or dark-colored urine. The patient has has not recently tried anything for pain relief.. Related to injury:   no.  Outpatient Medications Prior to Visit  Medication Sig Dispense Refill  . aspirin 81 MG EC tablet Take 1 tablet (81 mg total) by mouth daily. 30 tablet 12  . Blood Glucose Monitoring Suppl (TRUE METRIX GO GLUCOSE METER) w/Device KIT 1  each by Does not apply route every 8 (eight) hours as needed. 1 kit 0  . Coal Tar Extract (PSORIASIN EX) Apply 1 application topically as needed (for psoriasis).    [name] glucose blood test strip Use as instructed 100 each 12  . nitroGLYCERIN (NITROSTAT) 0.4 MG SL tablet DISSOLVE ONE TABLET UNDER THE TONGUE EVERY 5 MINUTES AS NEEDED FOR CHEST PAIN.  DO NOT EXCEED A TOTAL OF 3 DOSES IN 15 MINUTES 25 tablet 1  . predniSONE (DELTASONE) 20 MG tablet Take 3 tablets (60 mg total) by mouth daily with breakfast. Day 1-3, take 9m (=3 tabs) qam, day 4-7 take 470mqam (=2 tabs), day 8-10 take 1 tab daily, than stop 20 tablet 0  . TRUEPLUS LANCETS 26G MISC 1 each by Does not apply route every 8 (eight) hours as needed. 100 each 12  . amLODipine (NORVASC) 10 MG tablet Take 1 tablet (10 mg total) by mouth daily. 30 tablet 2  . atorvastatin (LIPITOR) 80 MG tablet Take 1 tablet (80 mg total) by mouth daily. 30 tablet 2  . ibuprofen (ADVIL,MOTRIN) 600 MG tablet TAKE 1 TABLET EVERY 6 HOURS AS NEEDED. 30 tablet 0  . lisinopril (PRINIVIL,ZESTRIL) 40 MG tablet Take 1 tablet (40 mg total) by mouth daily. 30 tablet 2  . metFORMIN (GLUCOPHAGE) 1000 MG tablet TAKE 1 TABLET BY MOUTH 2 TIMES DAILY WITH A MEAL. 60 tablet 2  . metoprolol tartrate (LOPRESSOR) 25 MG tablet TAKE ONE TABLET BY MOUTH TWICE DAILY 30 tablet 2   No facility-administered medications prior to visit.     ROS Review of Systems  Constitutional: Negative.   Eyes: Positive for visual disturbance.  Respiratory: Negative.   Cardiovascular: Negative.  Gastrointestinal: Negative.   Musculoskeletal: Positive for myalgias.  Skin: Negative.   Neurological:       Paresthesias    Objective:  BP 123/75 (BP Location: Left Arm, Patient Position: Sitting, Cuff Size: Normal)   Pulse (!) 55   Temp 98.7 F (37.1 C) (Oral)   Resp 18   Ht 5' 8" (1.727 m)   Wt 197 lb 12.8 oz (89.7 kg)   SpO2 99%   BMI 30.08 kg/m   BP/Weight 07/01/2017 05/06/2017 48/12/157    Systolic BP 968 957 022  Diastolic BP 75 70 79  Wt. (Lbs) 197.8 201.4 211.4  BMI 30.08 31.54 32.14   Physical Exam  Constitutional: He appears well-developed and well-nourished.  Eyes: Pupils are equal, round, and reactive to light. Conjunctivae are normal.  Neck: No JVD present.  Cardiovascular: Normal rate, regular rhythm, normal heart sounds and intact distal pulses.   Pulmonary/Chest: Effort normal and breath sounds normal.  Abdominal: Soft. Bowel sounds are normal. There is no tenderness.  Musculoskeletal: Normal range of motion.       Right upper arm: He exhibits no tenderness, no swelling and no edema.  Pain to RUE  Skin: Skin is warm and dry.  Nursing note and vitals reviewed.   Assessment & Plan:   1. Uncontrolled type 2 diabetes mellitus without complication, without long-term current use of insulin (HCC)  - Glucose (CBG) - metFORMIN (GLUCOPHAGE) 1000 MG tablet; TAKE 1 TABLET BY MOUTH 2 TIMES DAILY WITH A MEAL.  Dispense: 180 tablet; Refill: 0 - atorvastatin (LIPITOR) 80 MG tablet; Take 1 tablet (80 mg total) by mouth daily.  Dispense: 90 tablet; Refill: 0 - gabapentin (NEURONTIN) 300 MG capsule; Take 1 capsule (300 mg total) by mouth at bedtime.  Dispense: 90 capsule; Refill: 0 - Ambulatory referral to Ophthalmology  2. Medication refill  - metoprolol tartrate (LOPRESSOR) 25 MG tablet; TAKE ONE TABLET BY MOUTH TWICE DAILY  Dispense: 180 tablet; Refill: 0 - amLODipine (NORVASC) 10 MG tablet; Take 1 tablet (10 mg total) by mouth daily.  Dispense: 90 tablet; Refill: 0 - lisinopril (PRINIVIL,ZESTRIL) 40 MG tablet; Take 1 tablet (40 mg total) by mouth daily.  Dispense: 90 tablet; Refill: 0  3. Diabetic neuropathy, painful (HCC)  - gabapentin (NEURONTIN) 300 MG capsule; Take 1 capsule (300 mg total) by mouth at bedtime.  Dispense: 90 capsule; Refill: 0  4. Biceps tendinitis of right upper extremity  - naproxen (NAPROSYN) 500 MG tablet; Take 1 tablet (500 mg total) by  mouth 2 (two) times daily with a meal.  Dispense: 30 tablet; Refill: 0 - acetaminophen (TYLENOL) 500 MG tablet; Take 2 tablets (1,000 mg total) by mouth every 8 (eight) hours as needed for moderate pain.  Dispense: 30 tablet; Refill: 0     Meds ordered this encounter  Medications  . metFORMIN (GLUCOPHAGE) 1000 MG tablet    Sig: TAKE 1 TABLET BY MOUTH 2 TIMES DAILY WITH A MEAL.    Dispense:  180 tablet    Refill:  0    Order Specific Question:   Supervising Provider    Answer:   Tresa Garter [id number]  . metoprolol tartrate (LOPRESSOR) 25 MG tablet    Sig: TAKE ONE TABLET BY MOUTH TWICE DAILY    Dispense:  180 tablet    Refill:  0    Order Specific Question:   Supervising Provider    Answer:   Tresa Garter [id number]  . atorvastatin (LIPITOR) 80 MG tablet  Sig: Take 1 tablet (80 mg total) by mouth daily.    Dispense:  90 tablet    Refill:  0    Order Specific Question:   Supervising Provider    Answer:   Tresa Garter [id number]  . amLODipine (NORVASC) 10 MG tablet    Sig: Take 1 tablet (10 mg total) by mouth daily.    Dispense:  90 tablet    Refill:  0    Order Specific Question:   Supervising Provider    Answer:   Tresa Garter [id number]  . lisinopril (PRINIVIL,ZESTRIL) 40 MG tablet    Sig: Take 1 tablet (40 mg total) by mouth daily.    Dispense:  90 tablet    Refill:  0    Order Specific Question:   Supervising Provider    Answer:   Tresa Garter [id number]  . gabapentin (NEURONTIN) 300 MG capsule    Sig: Take 1 capsule (300 mg total) by mouth at bedtime.    Dispense:  90 capsule    Refill:  0    Order Specific Question:   Supervising Provider    Answer:   Tresa Garter [id number]  . naproxen (NAPROSYN) 500 MG tablet    Sig: Take 1 tablet (500 mg total) by mouth 2 (two) times daily with a meal.    Dispense:  30 tablet    Refill:  0    Order Specific Question:   Supervising Provider    Answer:   Tresa Garter [id number]   . acetaminophen (TYLENOL) 500 MG tablet    Sig: Take 2 tablets (1,000 mg total) by mouth every 8 (eight) hours as needed for moderate pain.    Dispense:  30 tablet    Refill:  0    Order Specific Question:   Supervising Provider    Answer:   Tresa Garter [[id number]]    Follow-up: Return in about 2 months (around 08/31/2017), or if symptoms worsen or fail to improve, for Diabetes.   Alfonse Spruce FNP

## 2017-07-01 NOTE — Progress Notes (Signed)
Patient is here for f/up  

## 2017-07-03 ENCOUNTER — Other Ambulatory Visit: Payer: Self-pay | Admitting: Family Medicine

## 2017-07-06 ENCOUNTER — Other Ambulatory Visit: Payer: Self-pay | Admitting: Family Medicine

## 2017-07-06 DIAGNOSIS — I10 Essential (primary) hypertension: Secondary | ICD-10-CM

## 2017-07-11 MED FILL — METOPROLOL TARTRATE 25 MG T: 25 | 30 days supply | Qty: 60 | Fill #0

## 2017-08-09 MED FILL — LISINOPRIL 40 MG TABLET: 40 | 30 days supply | Qty: 30 | Fill #1

## 2017-08-09 MED FILL — AMLODIPINE BESYLATE 10 MG T: 10 | 30 days supply | Qty: 30 | Fill #1

## 2017-08-09 MED FILL — METOPROLOL TARTRATE 25 MG T: 25 | 30 days supply | Qty: 60 | Fill #1

## 2017-08-11 MED FILL — ATORVASTATIN 80 MG TABLET: 80 | 30 days supply | Qty: 30 | Fill #1

## 2017-08-11 MED FILL — GABAPENTIN 300 MG CAPSULE: 300 | 30 days supply | Qty: 30 | Fill #1

## 2017-08-11 MED FILL — ?METFORMIN HCL 1,000 MG TAB: 1000 | 30 days supply | Qty: 60 | Fill #1

## 2017-08-30 ENCOUNTER — Ambulatory Visit: Payer: Self-pay | Admitting: Family Medicine

## 2017-09-08 MED FILL — LISINOPRIL 40 MG TAB: 40 | 30 days supply | Qty: 30 | Fill #2

## 2017-09-08 MED FILL — METOPROLOL TARTRATE 25 MG T: 25 | 30 days supply | Qty: 60 | Fill #2

## 2017-09-08 MED FILL — AMLODIPINE BESYLATE 10 MG T: 10 | 30 days supply | Qty: 30 | Fill #2

## 2017-09-13 MED FILL — ATORVASTATIN 80 MG TABLET: 80 | 30 days supply | Qty: 30 | Fill #2

## 2017-09-13 MED FILL — ?METFORMIN HCL 1,000 MG TAB: 1000 | 30 days supply | Qty: 60 | Fill #2

## 2017-09-21 ENCOUNTER — Encounter: Payer: Self-pay | Admitting: Family Medicine

## 2017-09-21 ENCOUNTER — Ambulatory Visit: Payer: Self-pay | Attending: Family Medicine | Admitting: Family Medicine

## 2017-09-21 VITALS — BP 130/81 | HR 70 | Temp 99.0°F | Resp 18 | Ht 68.0 in | Wt 197.0 lb

## 2017-09-21 DIAGNOSIS — E1165 Type 2 diabetes mellitus with hyperglycemia: Secondary | ICD-10-CM

## 2017-09-21 DIAGNOSIS — I10 Essential (primary) hypertension: Secondary | ICD-10-CM | POA: Insufficient documentation

## 2017-09-21 DIAGNOSIS — Z760 Encounter for issue of repeat prescription: Secondary | ICD-10-CM

## 2017-09-21 DIAGNOSIS — Z7984 Long term (current) use of oral hypoglycemic drugs: Secondary | ICD-10-CM | POA: Insufficient documentation

## 2017-09-21 DIAGNOSIS — E785 Hyperlipidemia, unspecified: Secondary | ICD-10-CM | POA: Insufficient documentation

## 2017-09-21 DIAGNOSIS — I252 Old myocardial infarction: Secondary | ICD-10-CM | POA: Insufficient documentation

## 2017-09-21 DIAGNOSIS — J069 Acute upper respiratory infection, unspecified: Secondary | ICD-10-CM

## 2017-09-21 DIAGNOSIS — R0789 Other chest pain: Secondary | ICD-10-CM | POA: Insufficient documentation

## 2017-09-21 DIAGNOSIS — Z79899 Other long term (current) drug therapy: Secondary | ICD-10-CM | POA: Insufficient documentation

## 2017-09-21 DIAGNOSIS — IMO0001 Reserved for inherently not codable concepts without codable children: Secondary | ICD-10-CM

## 2017-09-21 LAB — GLUCOSE, POCT (MANUAL RESULT ENTRY): POC Glucose: 94 mg/dl (ref 70–99)

## 2017-09-21 LAB — POCT GLYCOSYLATED HEMOGLOBIN (HGB A1C): HEMOGLOBIN A1C: 7.3

## 2017-09-21 MED ORDER — AZITHROMYCIN 250 MG PO TABS: ORAL_TABLET | ORAL | 0 refills | Status: DC

## 2017-09-21 MED ORDER — METFORMIN HCL 1000 MG PO TABS: ORAL_TABLET | ORAL | 0 refills | Status: DC

## 2017-09-21 MED ORDER — PHENYLEPHRINE-CHLORPHEN-DM 5-2-15 MG/5ML PO SYRP: 5.0000 mL | ORAL_SOLUTION | Freq: Four times a day (QID) | ORAL | 0 refills | Status: DC | PRN

## 2017-09-21 MED ORDER — LISINOPRIL 40 MG PO TABS: 40.0000 mg | ORAL_TABLET | Freq: Every day | ORAL | 0 refills | Status: DC

## 2017-09-21 MED ORDER — AMLODIPINE BESYLATE 10 MG PO TABS: 10.0000 mg | ORAL_TABLET | Freq: Every day | ORAL | 0 refills | Status: DC

## 2017-09-21 MED ORDER — ATORVASTATIN CALCIUM 80 MG PO TABS: 80.0000 mg | ORAL_TABLET | Freq: Every day | ORAL | 0 refills | Status: DC

## 2017-09-21 MED ORDER — ALBUTEROL SULFATE HFA 108 (90 BASE) MCG/ACT IN AERS
2.0000 | INHALATION_SPRAY | Freq: Four times a day (QID) | RESPIRATORY_TRACT | 2 refills | Status: DC | PRN
Start: 2017-09-21 — End: 2019-07-30

## 2017-09-21 NOTE — Patient Instructions (Signed)
Upper Respiratory Infection, Adult Most upper respiratory infections (URIs) are caused by a virus. A URI affects the nose, throat, and upper air passages. The most common type of URI is often called "the common cold." Follow these instructions at home:  Take medicines only as told by your doctor.  Gargle warm saltwater or take cough drops to comfort your throat as told by your doctor.  Use a warm mist humidifier or inhale steam from a shower to increase air moisture. This may make it easier to breathe.  Drink enough fluid to keep your pee (urine) clear or pale yellow.  Eat soups and other clear broths.  Have a healthy diet.  Rest as needed.  Go back to work when your fever is gone or your doctor says it is okay. ? You may need to stay home longer to avoid giving your URI to others. ? You can also wear a face mask and wash your hands often to prevent spread of the virus.  Use your inhaler more if you have asthma.  Do not use any tobacco products, including cigarettes, chewing tobacco, or electronic cigarettes. If you need help quitting, ask your doctor. Contact a doctor if:  You are getting worse, not better.  Your symptoms are not helped by medicine.  You have chills.  You are getting more short of breath.  You have brown or red mucus.  You have yellow or brown discharge from your nose.  You have pain in your face, especially when you bend forward.  You have a fever.  You have puffy (swollen) neck glands.  You have pain while swallowing.  You have white areas in the back of your throat. Get help right away if:  You have very bad or constant: ? Headache. ? Ear pain. ? Pain in your forehead, behind your eyes, and over your cheekbones (sinus pain). ? Chest pain.  You have long-lasting (chronic) lung disease and any of the following: ? Wheezing. ? Long-lasting cough. ? Coughing up blood. ? A change in your usual mucus.  You have a stiff neck.  You have  changes in your: ? Vision. ? Hearing. ? Thinking. ? Mood. This information is not intended to replace advice given to you by your health care provider. Make sure you discuss any questions you have with your health care provider. Document Released: 03/29/2008 Document Revised: 06/13/2016 Document Reviewed: 01/16/2014 Elsevier Interactive Patient Education  2018 [employer]  

## 2017-09-22 MED FILL — !VENTOLIN HFA INHALER: 108 (90 BAS | 25 days supply | Qty: 18 | Fill #0

## 2017-09-22 MED FILL — AZITHROMYCIN 250 MG TABLET: 250 | 5 days supply | Qty: 6 | Fill #0

## 2017-09-24 LAB — RESPIRATORY VIRUS PANEL
Adenovirus: NEGATIVE
INFLUENZA B 1: NEGATIVE
Influenza A: NEGATIVE
METAPNEUMOVIRUS: NEGATIVE
PARAINFLUENZA 1 A: NEGATIVE
PARAINFLUENZA 2 A: NEGATIVE
PARAINFLUENZA 3 A: NEGATIVE
RHINOVIRUS: NEGATIVE
Respiratory Syncytial Virus A: NEGATIVE
Respiratory Syncytial Virus B: NEGATIVE

## 2017-09-26 ENCOUNTER — Telehealth: Payer: Self-pay

## 2017-09-26 NOTE — Progress Notes (Signed)
Subjective:  Patient ID: Bradley Edman., male    DOB: Oct 01, 1971  Age: 46 y.o. MRN: [id number]  CC: Diabetes   HPI Bradley Wolfe. presents for follow up of diabetes. Symptoms: none. Patient denies foot ulcerations, nausea, paresthesia of the feet, polydipsia, polyuria, visual disturbances and vomitting.  Evaluation to date has been included: fasting blood sugar, fasting lipid panel and hemoglobin A1C.  Home sugars: patient does not check sugars. Treatment to date: metformin. History of hypertension. He is not exercising and is not adherent to low salt diet.  He does not check BP at home. Cardiac symptoms none. He does report a history of atypical chest pain with nitroglycerin use. Patient denies chest pain, chest pressure/discomfort, claudication, dyspnea, lower extremity edema, near-syncope, orthopnea, palpitations and syncope.  Cardiovascular risk factors: diabetes mellitus, dyslipidemia, hypertension, male gender and sedentary lifestyle. Use of agents associated with hypertension: none. History of target organ damage: angina/ prior myocardial infarction. Upper Respiratory Infection: Patient complains of symptoms of a URI. Symptoms include cough. Onset of symptoms was 2 weeks ago, unchanged since that time. He also c/o achiness, cough described as productive and fatigue . He is not a current smoker. valuation to date: none. Treatment to date: none.     Outpatient Medications Prior to Visit  Medication Sig Dispense Refill  . acetaminophen (TYLENOL) 500 MG tablet Take 2 tablets (1,000 mg total) by mouth every 8 (eight) hours as needed for moderate pain. 30 tablet 0  . aspirin 81 MG EC tablet Take 1 tablet (81 mg total) by mouth daily. 30 tablet 12  . Blood Glucose Monitoring Suppl (TRUE METRIX GO GLUCOSE METER) w/Device KIT 1 each by Does not apply route every 8 (eight) hours as needed. 1 kit 0  . Coal Tar Extract (PSORIASIN EX) Apply 1 application topically as needed (for  psoriasis).    . gabapentin (NEURONTIN) 300 MG capsule Take 1 capsule (300 mg total) by mouth at bedtime. 90 capsule 0  . glucose blood test strip Use as instructed 100 each 12  . metoprolol tartrate (LOPRESSOR) 25 MG tablet TAKE ONE TABLET BY MOUTH TWICE DAILY 180 tablet 0  . naproxen (NAPROSYN) 500 MG tablet Take 1 tablet (500 mg total) by mouth 2 (two) times daily with a meal. 30 tablet 0  . nitroGLYCERIN (NITROSTAT) 0.4 MG SL tablet DISSOLVE ONE TABLET UNDER THE TONGUE EVERY 5 MINUTES AS NEEDED FOR CHEST PAIN.  DO NOT EXCEED A TOTAL OF 3 DOSES IN 15 MINUTES 25 tablet 1  . predniSONE (DELTASONE) 20 MG tablet Take 3 tablets (60 mg total) by mouth daily with breakfast. Day 1-3, take '60mg'$  (=3 tabs) qam, day 4-7 take '40mg'$  qam (=2 tabs), day 8-10 take 1 tab daily, than stop 20 tablet 0  . TRUEPLUS LANCETS 26G MISC 1 each by Does not apply route every 8 (eight) hours as needed. 100 each 12  . amLODipine (NORVASC) 10 MG tablet Take 1 tablet (10 mg total) by mouth daily. 90 tablet 0  . atorvastatin (LIPITOR) 80 MG tablet Take 1 tablet (80 mg total) by mouth daily. 90 tablet 0  . lisinopril (PRINIVIL,ZESTRIL) 40 MG tablet Take 1 tablet (40 mg total) by mouth daily. 90 tablet 0  . metFORMIN (GLUCOPHAGE) 1000 MG tablet TAKE 1 TABLET BY MOUTH 2 TIMES DAILY WITH A MEAL. 180 tablet 0   No facility-administered medications prior to visit.     ROS Review of Systems  Constitutional: Positive for fatigue.  HENT: Positive  for congestion.   Eyes: Negative.   Respiratory: Positive for cough.   Cardiovascular: Negative.   Gastrointestinal: Negative.   Skin: Negative.     Objective:  BP 130/81 (BP Location: Left Arm, Patient Position: Sitting, Cuff Size: Normal)   Pulse 70   Temp 99 F (37.2 C) (Oral)   Resp 18   Ht '5\' 8"'$  (1.727 m)   Wt 197 lb (89.4 kg)   SpO2 97%   BMI 29.95 kg/m   BP/Weight 09/21/2017 07/01/2017 0/94/7096  Systolic BP 283 662 947  Diastolic BP 81 75 70  Wt. (Lbs) 197 197.8  201.4  BMI 29.95 30.08 31.54   Physical Exam  Constitutional: He is oriented to person, place, and time. He appears well-developed and well-nourished. He appears toxic.  HENT:  Head: Normocephalic and atraumatic.  Right Ear: External ear normal.  Left Ear: External ear normal.  Nose: Mucosal edema present.  Mouth/Throat: Oropharynx is clear and moist. No oropharyngeal exudate.  Eyes: Conjunctivae are normal. Pupils are equal, round, and reactive to light.  Neck: Normal range of motion. Neck supple. No JVD present.  Cardiovascular: Normal rate, regular rhythm, normal heart sounds and intact distal pulses.  Pulmonary/Chest: Effort normal. He has rhonchi (BLL/ BUL).  Abdominal: Soft. Bowel sounds are normal. There is no tenderness.  Musculoskeletal: Normal range of motion.       Right upper arm: He exhibits no tenderness, no swelling and no edema.  Neurological: He is alert and oriented to person, place, and time.  Skin: Skin is warm and dry.  Nursing note and vitals reviewed.   Assessment & Plan:   1. Uncontrolled type 2 diabetes mellitus without complication, without long-term current use of insulin (HCC) Hemoglobin A1c closer to goal.  Encourage more attention to diet and exercise interventions. - HgB A1c - Glucose (CBG) - metFORMIN (GLUCOPHAGE) 1000 MG tablet; TAKE 1 TABLET BY MOUTH 2 TIMES DAILY WITH A MEAL.  Dispense: 180 tablet; Refill: 0 - atorvastatin (LIPITOR) 80 MG tablet; Take 1 tablet (80 mg total) by mouth daily.  Dispense: 90 tablet; Refill: 0  2. Upper respiratory tract infection, unspecified type  - azithromycin (ZITHROMAX) 250 MG tablet; DAY ONE: TAKE TWO TABLET BY MOUTH. THEN TAKE ONE TABLET BY MOUTH DAILY  Dispense: 6 tablet; Refill: 0 - Respiratory virus panel - Phenylephrine-Chlorphen-DM 02-23-14 MG/5ML SYRP; Take 5 mLs by mouth every 6 (six) hours as needed.  Dispense: 437 mL; Refill: 0 - albuterol (PROVENTIL HFA;VENTOLIN HFA) 108 (90 Base) MCG/ACT inhaler;  Inhale 2 puffs into the lungs every 6 (six) hours as needed for wheezing or shortness of breath.  Dispense: 1 Inhaler; Refill: 2  3. Medication refill  - lisinopril (PRINIVIL,ZESTRIL) 40 MG tablet; Take 1 tablet (40 mg total) by mouth daily.  Dispense: 90 tablet; Refill: 0 - amLODipine (NORVASC) 10 MG tablet; Take 1 tablet (10 mg total) by mouth daily.  Dispense: 90 tablet; Refill: 0      Follow-up: Return in about 3 months (around 12/22/2017), or if symptoms worsen or fail to improve, for DM.   Alfonse Spruce FNP

## 2017-09-26 NOTE — Telephone Encounter (Signed)
-----   Message from Lizbeth BarkMandesia R Hairston, FNP sent at 09/26/2017  9:22 AM EST ----- RSV was negative for the  most common respiratory virus including influenza. Continue to take medications as prescribed for, increase fluid intake, rest, and practice good handwashing.

## 2017-09-26 NOTE — Telephone Encounter (Signed)
CMA call regarding lab results   Patient verify DOB  Patient was aware and understood  

## 2017-10-20 MED FILL — ATORVASTATIN 80 MG TABLET: 80 | 30 days supply | Qty: 30 | Fill #0

## 2017-10-20 MED FILL — LISINOPRIL 40 MG TAB: 40 | 30 days supply | Qty: 30 | Fill #0

## 2017-10-20 MED FILL — AMLODIPINE BESYLATE 10 MG T: 10 | 30 days supply | Qty: 30 | Fill #0

## 2017-10-20 MED FILL — ?METFORMIN HCL 1,000 MG TAB: 1000 | 30 days supply | Qty: 60 | Fill #0

## 2017-11-02 MED FILL — ?METOPROLOL 25 MG TABLET: 25 | 15 days supply | Qty: 30 | Fill #2

## 2017-11-07 ENCOUNTER — Other Ambulatory Visit: Payer: Self-pay | Admitting: Family Medicine

## 2017-11-07 DIAGNOSIS — Z760 Encounter for issue of repeat prescription: Secondary | ICD-10-CM

## 2017-11-07 MED FILL — METOPROLOL TARTRATE 25 MG T: 25 | 30 days supply | Qty: 60 | Fill #0

## 2017-11-15 ENCOUNTER — Encounter: Payer: Self-pay | Admitting: Nurse Practitioner

## 2017-11-15 ENCOUNTER — Ambulatory Visit: Payer: Self-pay | Attending: Nurse Practitioner | Admitting: Nurse Practitioner

## 2017-11-15 VITALS — BP 115/64 | HR 69 | Temp 98.1°F | Ht 68.0 in | Wt 195.0 lb

## 2017-11-15 DIAGNOSIS — R52 Pain, unspecified: Secondary | ICD-10-CM | POA: Insufficient documentation

## 2017-11-15 DIAGNOSIS — Z79899 Other long term (current) drug therapy: Secondary | ICD-10-CM | POA: Insufficient documentation

## 2017-11-15 DIAGNOSIS — I10 Essential (primary) hypertension: Secondary | ICD-10-CM | POA: Insufficient documentation

## 2017-11-15 DIAGNOSIS — R5383 Other fatigue: Secondary | ICD-10-CM | POA: Insufficient documentation

## 2017-11-15 DIAGNOSIS — E785 Hyperlipidemia, unspecified: Secondary | ICD-10-CM | POA: Insufficient documentation

## 2017-11-15 DIAGNOSIS — Z7984 Long term (current) use of oral hypoglycemic drugs: Secondary | ICD-10-CM | POA: Insufficient documentation

## 2017-11-15 DIAGNOSIS — I252 Old myocardial infarction: Secondary | ICD-10-CM | POA: Insufficient documentation

## 2017-11-15 DIAGNOSIS — K219 Gastro-esophageal reflux disease without esophagitis: Secondary | ICD-10-CM | POA: Insufficient documentation

## 2017-11-15 DIAGNOSIS — L409 Psoriasis, unspecified: Secondary | ICD-10-CM | POA: Insufficient documentation

## 2017-11-15 DIAGNOSIS — E1165 Type 2 diabetes mellitus with hyperglycemia: Secondary | ICD-10-CM | POA: Insufficient documentation

## 2017-11-15 DIAGNOSIS — I2510 Atherosclerotic heart disease of native coronary artery without angina pectoris: Secondary | ICD-10-CM | POA: Insufficient documentation

## 2017-11-15 DIAGNOSIS — Z7982 Long term (current) use of aspirin: Secondary | ICD-10-CM | POA: Insufficient documentation

## 2017-11-15 DIAGNOSIS — IMO0001 Reserved for inherently not codable concepts without codable children: Secondary | ICD-10-CM

## 2017-11-15 LAB — GLUCOSE, POCT (MANUAL RESULT ENTRY): POC Glucose: 126 mg/dl — AB (ref 70–99)

## 2017-11-15 MED FILL — ATORVASTATIN 80 MG TABLET: 80 | 30 days supply | Qty: 30 | Fill #1

## 2017-11-15 MED FILL — metFORMIN HCL 1000 MG TABS: 1000 | 30 days supply | Qty: 60 | Fill #1

## 2017-11-15 MED FILL — LISINOPRIL 40 MG TAB: 40 | 30 days supply | Qty: 30 | Fill #1

## 2017-11-15 MED FILL — AMLODIPINE BESYLATE 10 MG T: 10 | 30 days supply | Qty: 30 | Fill #1

## 2017-11-15 NOTE — Patient Instructions (Addendum)
Deconditioning °Deconditioning refers to the changes in your body that occur during a period of inactivity. The changes happen in your heart, lungs, and muscles. They decrease your ability to be active, and they make you feel tired and weak. °There are three stages of deconditioning: °· Mild deconditioning. At this stage, you will notice a change in your ability to do your usual exercise activities, such as running, biking, or swimming. °· Moderate deconditioning. At this stage, you will notice a change in your ability to do normal everyday activities, such as walking, grocery shopping, and doing chores. °· Severe deconditioning. At this stage, you will notice a change in your ability to do minimal activity or normal self-care. ° °Deconditioning can occur after only a few days of inactivity. The longer the period of inactivity, the more severe the deconditioning will be, and the longer it will take to return to your previous level of functioning. °What are the causes? °Deconditioning is often caused by inactivity due to: °· Illnesses, such as cancer, stroke, heart attack, fibromyalgia, and chronic fatigue syndrome. °· Injuries, especially back injuries, broken bones, and ligament and tendon injuries. °· A long stay in the hospital. °· Pregnancy, especially if long periods of bed rest are needed. ° °What increases the risk? °This condition is more likely to develop in: °· People who are hospitalized. °· People on bed rest. °· People who are obese. °· People with poor nutrition. °· Elderly adults. °· People with injuries or illnesses that interfere with movement and activity. ° °What are the signs or symptoms? °Symptoms of deconditioning include: °· Weakness. °· Tiredness. °· Shortness of breath with minor exertion. °· A faster-than-normal heartbeat. You may not notice this without taking your pulse. °· Pain or discomfort with activity. °· Decreased strength. °· Decreased sense of balance. °· Decreased  endurance. °· Difficulty doing your usual forms of exercise. °· Difficulty doing activities of daily living, such as grocery shopping or chores. °· Difficulty walking around the house and doing basic self-care, such as getting to the bathroom, preparing meals, or doing laundry. ° °How is this diagnosed? °Deconditioning is diagnosed based on your medical history and a physical exam. During the physical exam, your health care provider will check for signs of deconditioning, such as: °· Decreased size of muscles. °· Decreased strength. °· Trouble with balance. °· Shortness of breath or abnormally increased heart rate after minor exertion. ° °How is this treated? °Treatment for deconditioning usually involves following a structured exercise program in which activity is increased gradually. Your health care provider will determine which exercises are right for you. The exercise program will likely include aerobic exercise and strength training: °· Aerobic exercise helps improve the functioning of the heart and lungs as well as the muscles. °· Strength training helps improve muscle size and strength. ° °Both of these types of exercise will improve your endurance. You may be referred to a physical therapist who can create a safe strengthening program for you to follow. °Follow these instructions at home: °· Follow the exercise program that is recommended by your health care provider or physical therapist. °· Do not increase your exercise any faster than directed. °· Eat a healthy diet. °· Do not use any products that contain nicotine or tobacco, such as cigarettes and e-cigarettes. If you need help quitting, ask your health care provider. °· Take over-the-counter and prescription medicines only as told by your health care provider. °· Keep all follow-up visits as told by your health care   provider. This is important. °Contact a health care provider if: °· You are not able to carry out the prescribed exercise program. °· You  are becoming more and more fatigued and weak. °· You become light-headed when rising to a sitting or standing position. °· Your level of endurance decreases after it has improved. °Get help right away if: °· You have chest pain. °· You are very short of breath. °· You have any episodes of passing out. °This information is not intended to replace advice given to you by your health care provider. Make sure you discuss any questions you have with your health care provider. °Document Released: 02/25/2014 Document Revised: 04/30/2016 Document Reviewed: 01/10/2016 °Elsevier Interactive Patient Education © 2018 [employer] ° °

## 2017-11-15 NOTE — Progress Notes (Signed)
 Assessment & Plan:  Bradley Wolfe was seen today for generalized body aches.  Diagnoses and all orders for this visit:  Uncontrolled type 2 diabetes mellitus without complication, without long-term current use of insulin (HCC) -     Glucose (CBG) Continue medications. Keep blood sugar logs as instructed.   Generalized body aches Recommended patient wear a back brace while working to help reduce back aches from prolonged standing.  May take acetaminophen and alternate with ibuprofen for pain relief   Patient has been counseled on age-appropriate routine health concerns for screening and prevention. These are reviewed and up-to-date. Referrals have been placed accordingly. Immunizations are up-to-date or declined.    Subjective:   Chief Complaint  Patient presents with  . Generalized Body Aches    Patient stated he just started a new job with 3rd shift and his lower body aches/sore from it. Patient stated he gets tired and run out of energy from going to work.     HPI  Seen today with complaints of body aches and questioning why he has to come up to the pharmacy every 30 days for his medications instead of being able to pick them up every 90 days. After speaking with someone in pharmacy I explained to him that because his medications are at not cost to him they are only available as a courtesy every 30 days. If he were self pay he would be able to pay for a 90 day prescription out of pocket. He verbalized understanding.  Fatigue Patient complains of fatigue and generalized body aches. Symptoms began several days ago with the onset of his new job. Sentinal symptom the patient feels fatigue began with: none. Symptoms of his fatigue have been general malaise and body aches (back and leg soreness from prolonged standing). Patient describes the following psychologic symptoms: none.  Symptoms have stabilized. Severity has been symptoms bothersome, but easily able to carry out all usual  work/school/family activities. Previous visits for this problem: none.    Review of Systems  Constitutional: Positive for malaise/fatigue. Negative for chills, diaphoresis, fever and weight loss.  HENT: Negative.   Respiratory: Negative.  Negative for cough, sputum production and shortness of breath.   Cardiovascular: Negative.  Negative for chest pain, orthopnea, leg swelling and PND.  Musculoskeletal: Positive for back pain and myalgias. Negative for falls, joint pain and neck pain.       SEE HPI  Neurological: Negative.  Negative for dizziness, sensory change, focal weakness, weakness and headaches.  Psychiatric/Behavioral: Negative.     Past Medical History:  Diagnosis Date  . CAD (coronary artery disease), native coronary artery 12/21/12   a. NSTEMI 2/14 => LHC 12/21/12: mLAD 90%, OM1 20%, mRCA 100%, EF 55-60%.  PCI: Xience Xpedition DES to mRCA and Xience Xpedition DES to mLAD.   . GERD (gastroesophageal reflux disease)   . Hyperlipidemia   . Hypertension   . Psoriasis   . Tobacco abuse     Past Surgical History:  Procedure Laterality Date  . CORONARY ANGIOPLASTY WITH STENT PLACEMENT  12/21/12   90% mid LAD s/p DES, 20% OM1, 100% mid RCA s/p DES; LVEF 55-60%  . LEFT HEART CATHETERIZATION WITH CORONARY ANGIOGRAM N/A 12/21/2012   Procedure: LEFT HEART CATHETERIZATION WITH CORONARY ANGIOGRAM;  Surgeon: Christopher D McAlhany, MD;  Location: [facility] CATH LAB;  Service: Cardiovascular;  Laterality: N/A;    Family History  Problem Relation Age of Onset  . Heart attack Father   . Heart attack Mother       Social History Reviewed with no changes to be made today.   Outpatient Medications Prior to Visit  Medication Sig Dispense Refill  . albuterol (PROVENTIL HFA;VENTOLIN HFA) 108 (90 Base) MCG/ACT inhaler Inhale 2 puffs into the lungs every 6 (six) hours as needed for wheezing or shortness of breath. 1 Inhaler 2  . amLODipine (NORVASC) 10 MG tablet Take 1 tablet (10 mg total) by mouth  daily. 90 tablet 0  . aspirin 81 MG EC tablet Take 1 tablet (81 mg total) by mouth daily. 30 tablet 12  . atorvastatin (LIPITOR) 80 MG tablet Take 1 tablet (80 mg total) by mouth daily. 90 tablet 0  . Blood Glucose Monitoring Suppl (TRUE METRIX GO GLUCOSE METER) w/Device KIT 1 each by Does not apply route every 8 (eight) hours as needed. 1 kit 0  . gabapentin (NEURONTIN) 300 MG capsule Take 1 capsule (300 mg total) by mouth at bedtime. 90 capsule 0  . glucose blood test strip Use as instructed 100 each 12  . lisinopril (PRINIVIL,ZESTRIL) 40 MG tablet Take 1 tablet (40 mg total) by mouth daily. 90 tablet 0  . metFORMIN (GLUCOPHAGE) 1000 MG tablet TAKE 1 TABLET BY MOUTH 2 TIMES DAILY WITH A MEAL. 180 tablet 0  . metoprolol tartrate (LOPRESSOR) 25 MG tablet TAKE ONE TABLET BY MOUTH TWICE DAILY 180 tablet 0  . nitroGLYCERIN (NITROSTAT) 0.4 MG SL tablet DISSOLVE ONE TABLET UNDER THE TONGUE EVERY 5 MINUTES AS NEEDED FOR CHEST PAIN.  DO NOT EXCEED A TOTAL OF 3 DOSES IN 15 MINUTES 25 tablet 1  . predniSONE (DELTASONE) 20 MG tablet Take 3 tablets (60 mg total) by mouth daily with breakfast. Day 1-3, take 60mg (=3 tabs) qam, day 4-7 take 40mg qam (=2 tabs), day 8-10 take 1 tab daily, than stop 20 tablet 0  . TRUEPLUS LANCETS 26G MISC 1 each by Does not apply route every 8 (eight) hours as needed. 100 each 12  . acetaminophen (TYLENOL) 500 MG tablet Take 2 tablets (1,000 mg total) by mouth every 8 (eight) hours as needed for moderate pain. (Patient not taking: Reported on 11/15/2017) 30 tablet 0  . azithromycin (ZITHROMAX) 250 MG tablet DAY ONE: TAKE TWO TABLET BY MOUTH. THEN TAKE ONE TABLET BY MOUTH DAILY (Patient not taking: Reported on 11/15/2017) 6 tablet 0  . Coal Tar Extract (PSORIASIN EX) Apply 1 application topically as needed (for psoriasis).    . naproxen (NAPROSYN) 500 MG tablet Take 1 tablet (500 mg total) by mouth 2 (two) times daily with a meal. (Patient not taking: Reported on 11/15/2017) 30 tablet 0   . Phenylephrine-Chlorphen-DM 02-23-14 MG/5ML SYRP Take 5 mLs by mouth every 6 (six) hours as needed. (Patient not taking: Reported on 11/15/2017) 437 mL 0   No facility-administered medications prior to visit.     No Known Allergies     Objective:    BP 115/64 (BP Location: Right Arm, Patient Position: Sitting, Cuff Size: Normal)   Pulse 69   Temp 98.1 F (36.7 C) (Oral)   Ht 5' 8" (1.727 m)   Wt 195 lb (88.5 kg)   SpO2 98%   BMI 29.65 kg/m  Wt Readings from Last 3 Encounters:  11/15/17 195 lb (88.5 kg)  09/21/17 197 lb (89.4 kg)  07/01/17 197 lb 12.8 oz (89.7 kg)    Physical Exam  Constitutional: He is oriented to person, place, and time. He appears well-developed.  HENT:  Head: Normocephalic.  Mouth/Throat: Abnormal dentition.  Eyes: EOM are normal.    Neck: Normal range of motion.  Cardiovascular:  Murmur heard. Pulmonary/Chest: Effort normal and breath sounds normal. No respiratory distress. He has no wheezes. He has no rales. He exhibits no tenderness.  Musculoskeletal: Normal range of motion.  Neurological: He is alert and oriented to person, place, and time.  Psychiatric: He has a normal mood and affect. His behavior is normal. Judgment and thought content normal.       Patient has been counseled extensively about nutrition and exercise as well as the importance of adherence with medications and regular follow-up. The patient was given clear instructions to go to ER or return to medical center if symptoms don't improve, worsen or new problems develop. The patient verbalized understanding.   Follow-up: No Follow-up on file.   Zelda W Fleming, FNP-BC [facility] [place], [place] [phone]   11/15/2017, 12:27 PM 

## 2017-11-24 ENCOUNTER — Ambulatory Visit: Payer: Self-pay

## 2017-12-23 ENCOUNTER — Ambulatory Visit: Payer: Self-pay | Admitting: Family Medicine

## 2017-12-26 ENCOUNTER — Ambulatory Visit: Payer: Self-pay | Attending: Nurse Practitioner

## 2017-12-26 MED FILL — AMLODIPINE BESYLATE 10 MG T: 10 | 30 days supply | Qty: 30 | Fill #2

## 2017-12-26 MED FILL — metFORMIN HCL 1000 MG TABS: 1000 | 30 days supply | Qty: 60 | Fill #2

## 2017-12-26 MED FILL — ATORVASTATIN 80 MG TABLET: 80 | 30 days supply | Qty: 30 | Fill #2

## 2017-12-26 MED FILL — LISINOPRIL 40 MG TAB: 40 | 30 days supply | Qty: 30 | Fill #2

## 2017-12-26 MED FILL — NITROSTAT 0.4 MG TABLET SL: 0.4 | 25 days supply | Qty: 25 | Fill #0

## 2017-12-28 ENCOUNTER — Ambulatory Visit: Payer: Self-pay | Attending: Family Medicine | Admitting: Nurse Practitioner

## 2017-12-28 ENCOUNTER — Encounter: Payer: Self-pay | Admitting: Nurse Practitioner

## 2017-12-28 VITALS — BP 133/82 | HR 50 | Temp 98.4°F | Ht 68.0 in | Wt 194.4 lb

## 2017-12-28 DIAGNOSIS — I252 Old myocardial infarction: Secondary | ICD-10-CM | POA: Insufficient documentation

## 2017-12-28 DIAGNOSIS — K219 Gastro-esophageal reflux disease without esophagitis: Secondary | ICD-10-CM

## 2017-12-28 DIAGNOSIS — IMO0001 Reserved for inherently not codable concepts without codable children: Secondary | ICD-10-CM

## 2017-12-28 DIAGNOSIS — R079 Chest pain, unspecified: Secondary | ICD-10-CM | POA: Insufficient documentation

## 2017-12-28 DIAGNOSIS — I2510 Atherosclerotic heart disease of native coronary artery without angina pectoris: Secondary | ICD-10-CM | POA: Insufficient documentation

## 2017-12-28 DIAGNOSIS — I10 Essential (primary) hypertension: Secondary | ICD-10-CM

## 2017-12-28 DIAGNOSIS — E1165 Type 2 diabetes mellitus with hyperglycemia: Secondary | ICD-10-CM | POA: Insufficient documentation

## 2017-12-28 DIAGNOSIS — L409 Psoriasis, unspecified: Secondary | ICD-10-CM | POA: Insufficient documentation

## 2017-12-28 DIAGNOSIS — Z7984 Long term (current) use of oral hypoglycemic drugs: Secondary | ICD-10-CM | POA: Insufficient documentation

## 2017-12-28 DIAGNOSIS — Z0000 Encounter for general adult medical examination without abnormal findings: Secondary | ICD-10-CM | POA: Insufficient documentation

## 2017-12-28 DIAGNOSIS — Z7982 Long term (current) use of aspirin: Secondary | ICD-10-CM | POA: Insufficient documentation

## 2017-12-28 DIAGNOSIS — R0789 Other chest pain: Secondary | ICD-10-CM

## 2017-12-28 DIAGNOSIS — E785 Hyperlipidemia, unspecified: Secondary | ICD-10-CM | POA: Insufficient documentation

## 2017-12-28 DIAGNOSIS — Z955 Presence of coronary angioplasty implant and graft: Secondary | ICD-10-CM | POA: Insufficient documentation

## 2017-12-28 DIAGNOSIS — R239 Unspecified skin changes: Secondary | ICD-10-CM

## 2017-12-28 DIAGNOSIS — Z79899 Other long term (current) drug therapy: Secondary | ICD-10-CM | POA: Insufficient documentation

## 2017-12-28 DIAGNOSIS — Z23 Encounter for immunization: Secondary | ICD-10-CM

## 2017-12-28 DIAGNOSIS — Z720 Tobacco use: Secondary | ICD-10-CM | POA: Insufficient documentation

## 2017-12-28 LAB — GLUCOSE, POCT (MANUAL RESULT ENTRY): POC Glucose: 93 mg/dl (ref 70–99)

## 2017-12-28 LAB — POCT GLYCOSYLATED HEMOGLOBIN (HGB A1C): HEMOGLOBIN A1C: 6.7

## 2017-12-28 MED ORDER — OMEPRAZOLE 40 MG PO CPDR: 40.0000 mg | DELAYED_RELEASE_CAPSULE | Freq: Every day | ORAL | 3 refills | Status: DC

## 2017-12-28 MED ORDER — CYCLOBENZAPRINE HCL 10 MG PO TABS: 10.0000 mg | ORAL_TABLET | Freq: Three times a day (TID) | ORAL | 0 refills | Status: DC | PRN

## 2017-12-28 MED ORDER — METOPROLOL SUCCINATE ER 25 MG PO TB24: 25.0000 mg | ORAL_TABLET | Freq: Every day | ORAL | 3 refills | Status: DC

## 2017-12-28 MED ORDER — METOPROLOL SUCCINATE ER 25 MG PO TB24
25.0000 mg | ORAL_TABLET | Freq: Every day | ORAL | 3 refills | Status: DC
Start: 2017-12-28 — End: 2017-12-28

## 2017-12-28 MED FILL — METOPROLOL SUCCINATE ER 25: 25 | 30 days supply | Qty: 30 | Fill #0

## 2017-12-28 MED FILL — CYCLOBENZAPRINE 10 MG TAB: 10 | 20 days supply | Qty: 60 | Fill #0

## 2017-12-28 MED FILL — OMEPRAZOLE DR 40 MG CAPSULE: 40 | 30 days supply | Qty: 30 | Fill #0

## 2017-12-28 NOTE — Patient Instructions (Addendum)

## 2017-12-28 NOTE — Progress Notes (Signed)
Assessment & Plan:  Bradley Wolfe was seen today for establish care and chest pain.  Diagnoses and all orders for this visit:  Uncontrolled type 2 diabetes mellitus without complication, without long-term current use of insulin (HCC) -     HgB A1c -     Glucose (CBG) -     CMP14+EGFR Diabetes Mellitus, Type 2 : Controlled Continue medications. Keep blood sugar logs as instructed.  Essential hypertension -     CMP14+EGFR      metoprolol succinate (TOPROL-XL) 25 MG 24 hr tablet; Take 1 tablet (25 mg total) by mouth daily.  Continue all antihypertensives as prescribed.  Remember to bring in your blood pressure log with you for your follow up appointment.  DASH/Mediterranean Diets are healthier choices for HTN.   Immunization due -     Flu Vaccine QUAD 6+ mos PF IM (Fluarix Quad PF)  Unspecified skin changes -     Ambulatory referral to Dermatology  Gastroesophageal reflux disease, esophagitis presence not specified -     omeprazole (PRILOSEC) 40 MG capsule; Take 1 capsule (40 mg total) by mouth daily. INSTRUCTIONS: Avoid GERD Triggers: acidic, spicy or fried foods, caffeine, coffee, sodas,  alcohol and chocolate.   Chest pain, musculoskeletal -     cyclobenzaprine (FLEXERIL) 10 MG tablet; Take 1 tablet (10 mg total) by mouth 3 (three) times daily as needed for muscle spasms.  May alternate with heat and ice application for pain relief. May also alternate with acetaminophen and Ibuprofen as needed for relief of pain.    Patient has been counseled on age-appropriate routine health concerns for screening and prevention. These are reviewed and up-to-date. Referrals have been placed accordingly. Immunizations are up-to-date or declined.    Subjective:   Chief Complaint  Patient presents with  . Establish Care    Patient is here to establish care for diabetes and would like a referral dermatology psoriasis.  . Chest Pain    Patient stated he had chest pain on Friday and Saturday  night. No chest pain at the moment.    HPI Bradley Wolfe 47 y.o. male presents to office today to establish care.  Diabetes Mellitus Type 2 Chronic. Stable. He is only spot checking his blood sugars. Average 100-130s.  A1c today is down to 6.7 from 7.3.  He denies any visual disturbances, hypo-or hyperglycemic symptoms.  He is not diet or exercise compliance. Lab Results  Component Value Date   HGBA1C 6.7 12/28/2017    Essential Hypertension Chronic.  Blood pressure is well controlled however he is bradycardic today on metoprolol tartrate 25 mg twice daily.  As he has a history of CAD with non-STEMI will switch Lopressor to Toprol-XL. He recently just refilled his lopressor and can not afford to switch at this time. He was instructed to take 1/2 tablet of lopressor 12.'5mg'$   BID until he runs out and then he can switch to Toprol XL '25mg'$  once daily. He currently denies  shortness of breath, palpitations, lightheadedness, dizziness, headaches or BLE edema.  BP Readings from Last 3 Encounters:  12/28/17 133/82  11/15/17 115/64  09/21/17 130/81   Epigastric Pain Patient complains of epigastric chest pain and pain in his bilateral pectoralis muscles. He states his job requires him to perform strenuous pulling, pushing and lifting and the pain is noticeable when he gets off of work.   Onset was a few weeks ago, with unchanged course since that time. The patient describes the pain as intermittent, achy  in nature, does not radiate. Patient rates pain as a 5/10 in intensity.  Associated symptoms are none. Aggravating factors are heavy lifting, pulling or pushing.  Alleviating factors are: none and symptoms resolve on their own. Patient's cardiac risk factors are dyslipidemia, hypertension, diabetes and male gender.  Patient's risk factors for DVT/PE: none. Previous cardiac testing: NONE.  Bradley Wolfe also has a history of GERD however he has not taken omeprazole in several months.  He does also  endorse eating spicy, greasy, and fried foods.  He states he noticed his pain was worse after he ate pizza the night before.  Also his bilateral pectoralis muscle pain is reproducible.  I believe this is all likely related to GERD and musculoskeletal pain secondary to his strenuous job duties.   Review of Systems  Constitutional: Negative for fever, malaise/fatigue and weight loss.  HENT: Negative.  Negative for nosebleeds.   Eyes: Negative.  Negative for blurred vision, double vision and photophobia.  Respiratory: Negative.  Negative for cough and shortness of breath.   Cardiovascular: Positive for chest pain. Negative for palpitations and leg swelling.       SEE HPI  Gastrointestinal: Negative.  Negative for heartburn, nausea and vomiting.  Musculoskeletal: Positive for myalgias.       SEE HPI  Skin: Positive for rash. Negative for itching.  Neurological: Negative.  Negative for dizziness, focal weakness, seizures and headaches.  Psychiatric/Behavioral: Negative.  Negative for suicidal ideas.    Past Medical History:  Diagnosis Date  . CAD (coronary artery disease), native coronary artery 12/21/12   a. NSTEMI 2/14 => LHC 12/21/12: mLAD 90%, OM1 20%, mRCA 100%, EF 55-60%.  PCI: Xience Xpedition DES to mRCA and Xience Xpedition DES to mLAD.   . Diabetes mellitus without complication ([place])   . GERD (gastroesophageal reflux disease)   . Hyperlipidemia   . Hypertension   . Psoriasis   . Tobacco abuse     Past Surgical History:  Procedure Laterality Date  . CORONARY ANGIOPLASTY WITH STENT PLACEMENT  12/21/12   90% mid LAD s/p DES, 20% OM1, 100% mid RCA s/p DES; LVEF 55-60%  . LEFT HEART CATHETERIZATION WITH CORONARY ANGIOGRAM N/A 12/21/2012   Procedure: LEFT HEART CATHETERIZATION WITH CORONARY ANGIOGRAM;  Surgeon: Burnell Blanks, MD;  Location: [facility] CATH LAB;  Service: Cardiovascular;  Laterality: N/A;    Family History  Problem Relation Age of Onset  . Heart attack Father     . Heart attack Mother     Social History Reviewed with no changes to be made today.   Outpatient Medications Prior to Visit  Medication Sig Dispense Refill  . albuterol (PROVENTIL HFA;VENTOLIN HFA) 108 (90 Base) MCG/ACT inhaler Inhale 2 puffs into the lungs every 6 (six) hours as needed for wheezing or shortness of breath. 1 Inhaler 2  . amLODipine (NORVASC) 10 MG tablet Take 1 tablet (10 mg total) by mouth daily. 90 tablet 0  . aspirin 81 MG EC tablet Take 1 tablet (81 mg total) by mouth daily. 30 tablet 12  . atorvastatin (LIPITOR) 80 MG tablet Take 1 tablet (80 mg total) by mouth daily. 90 tablet 0  . Blood Glucose Monitoring Suppl (TRUE METRIX GO GLUCOSE METER) w/Device KIT 1 each by Does not apply route every 8 (eight) hours as needed. 1 kit 0  . glucose blood test strip Use as instructed 100 each 12  . lisinopril (PRINIVIL,ZESTRIL) 40 MG tablet Take 1 tablet (40 mg total) by mouth daily. 90 tablet  0  . metFORMIN (GLUCOPHAGE) 1000 MG tablet TAKE 1 TABLET BY MOUTH 2 TIMES DAILY WITH A MEAL. 180 tablet 0  . nitroGLYCERIN (NITROSTAT) 0.4 MG SL tablet DISSOLVE ONE TABLET UNDER THE TONGUE EVERY 5 MINUTES AS NEEDED FOR CHEST PAIN.  DO NOT EXCEED A TOTAL OF 3 DOSES IN 15 MINUTES 25 tablet 1  . TRUEPLUS LANCETS 26G MISC 1 each by Does not apply route every 8 (eight) hours as needed. 100 each 12  . metoprolol tartrate (LOPRESSOR) 25 MG tablet TAKE ONE TABLET BY MOUTH TWICE DAILY 180 tablet 0  . acetaminophen (TYLENOL) 500 MG tablet Take 2 tablets (1,000 mg total) by mouth every 8 (eight) hours as needed for moderate pain. (Patient not taking: Reported on 11/15/2017) 30 tablet 0  . Coal Tar Extract (PSORIASIN EX) Apply 1 application topically as needed (for psoriasis).    . gabapentin (NEURONTIN) 300 MG capsule Take 1 capsule (300 mg total) by mouth at bedtime. (Patient not taking: Reported on 12/28/2017) 90 capsule 0  . naproxen (NAPROSYN) 500 MG tablet Take 1 tablet (500 mg total) by mouth 2 (two)  times daily with a meal. (Patient not taking: Reported on 11/15/2017) 30 tablet 0  . azithromycin (ZITHROMAX) 250 MG tablet DAY ONE: TAKE TWO TABLET BY MOUTH. THEN TAKE ONE TABLET BY MOUTH DAILY (Patient not taking: Reported on 11/15/2017) 6 tablet 0  . Phenylephrine-Chlorphen-DM 02-23-14 MG/5ML SYRP Take 5 mLs by mouth every 6 (six) hours as needed. (Patient not taking: Reported on 11/15/2017) 437 mL 0  . predniSONE (DELTASONE) 20 MG tablet Take 3 tablets (60 mg total) by mouth daily with breakfast. Day 1-3, take '60mg'$  (=3 tabs) qam, day 4-7 take '40mg'$  qam (=2 tabs), day 8-10 take 1 tab daily, than stop (Patient not taking: Reported on 12/28/2017) 20 tablet 0   No facility-administered medications prior to visit.     No Known Allergies     Objective:    BP 133/82 (BP Location: Right Arm, Patient Position: Sitting, Cuff Size: Normal)   Pulse (!) 50   Temp 98.4 F (36.9 C) (Oral)   Ht '5\' 8"'$  (1.727 m)   Wt 194 lb 6.4 oz (88.2 kg)   SpO2 100%   BMI 29.56 kg/m  Wt Readings from Last 3 Encounters:  12/28/17 194 lb 6.4 oz (88.2 kg)  11/15/17 195 lb (88.5 kg)  09/21/17 197 lb (89.4 kg)    Physical Exam  Constitutional: He is oriented to person, place, and time. He appears well-developed and well-nourished. He is cooperative.  HENT:  Head: Normocephalic and atraumatic.  Eyes: EOM are normal.  Neck: Normal range of motion.  Cardiovascular: Regular rhythm, normal heart sounds and intact distal pulses. Bradycardia present. Exam reveals no gallop and no friction rub.  No murmur heard. Pulmonary/Chest: Effort normal and breath sounds normal. No tachypnea. No respiratory distress. He has no decreased breath sounds. He has no wheezes. He has no rhonchi. He has no rales. He exhibits tenderness. He exhibits no bony tenderness.    Abdominal: Soft. Bowel sounds are normal.  Musculoskeletal: Normal range of motion. He exhibits no edema.  Neurological: He is alert and oriented to person, place, and time.  Coordination normal.  Skin: Skin is warm and dry. Rash noted. Rash is macular (generalized hyperpigmented lesions on face and bilateral hands ).  Psychiatric: He has a normal mood and affect. His behavior is normal. Judgment and thought content normal.  Nursing note and vitals reviewed.  Patient has been counseled extensively about nutrition and exercise as well as the importance of adherence with medications and regular follow-up. The patient was given clear instructions to go to ER or return to medical center if symptoms don't improve, worsen or new problems develop. The patient verbalized understanding.   Follow-up: Return in about 3 months (around 03/30/2018) for HTN//DM.   Gildardo Pounds, FNP-BC [facility], [place]   12/28/2017, 1:18 PM

## 2017-12-29 LAB — CMP14+EGFR
ALK PHOS: 85 IU/L (ref 39–117)
ALT: 18 IU/L (ref 0–44)
AST: 16 IU/L (ref 0–40)
Albumin/Globulin Ratio: 1.6 (ref 1.2–2.2)
Albumin: 4.1 g/dL (ref 3.5–5.5)
BILIRUBIN TOTAL: 0.2 mg/dL (ref 0.0–1.2)
BUN/Creatinine Ratio: 13 (ref 9–20)
BUN: 10 mg/dL (ref 6–24)
CHLORIDE: 103 mmol/L (ref 96–106)
CO2: 25 mmol/L (ref 20–29)
CREATININE: 0.75 mg/dL — AB (ref 0.76–1.27)
Calcium: 9.2 mg/dL (ref 8.7–10.2)
GFR calc Af Amer: 126 mL/min/{1.73_m2} (ref >59)
GFR calc non Af Amer: 109 mL/min/{1.73_m2} (ref >59)
GLUCOSE: 86 mg/dL (ref 65–99)
Globulin, Total: 2.5 g/dL (ref 1.5–4.5)
Potassium: 4 mmol/L (ref 3.5–5.2)
Sodium: 140 mmol/L (ref 134–144)
Total Protein: 6.6 g/dL (ref 6.0–8.5)

## 2017-12-30 ENCOUNTER — Telehealth: Payer: Self-pay

## 2017-12-30 NOTE — Telephone Encounter (Signed)
-----   Message from Claiborne RiggZelda W Fleming, NP sent at 12/29/2017 11:00 AM EST ----- Please inform patient that laboratory results are normal. Continue healthy eating habit and regular physical exercise at least 3 times a week, 50 minutes each time. Drinking 48-64ounces of water daily and avoiding adding salt to his foods.

## 2017-12-30 NOTE — Telephone Encounter (Signed)
CMA called patient to inform on lab results and PCP advising.  Patient understood, no questions.

## 2018-02-03 MED FILL — OMEPRAZOLE DR 40 MG CAPSULE: 40 | 30 days supply | Qty: 30 | Fill #1

## 2018-02-03 MED FILL — METOPROLOL SUCCINATE ER 25: 25 | 30 days supply | Qty: 30 | Fill #1

## 2018-02-03 MED FILL — AMLODIPINE BESYLATE 10 MG T: 10 | 30 days supply | Qty: 30 | Fill #1

## 2018-03-22 MED FILL — LISINOPRIL 40 MG TABLET: 40 | 30 days supply | Qty: 30 | Fill #1

## 2018-03-22 MED FILL — METOPROLOL SUCCINATE ER 25: 25 | 30 days supply | Qty: 30 | Fill #2

## 2018-03-22 MED FILL — OMEPRAZOLE DR 40 MG CAPSULE: 40 | 30 days supply | Qty: 30 | Fill #2

## 2018-03-22 MED FILL — AMLODIPINE BESYLATE 10 MG T: 10 | 30 days supply | Qty: 30 | Fill #2

## 2018-03-27 ENCOUNTER — Ambulatory Visit: Payer: Self-pay | Admitting: Nurse Practitioner

## 2018-03-31 ENCOUNTER — Ambulatory Visit: Payer: Self-pay | Attending: Nurse Practitioner | Admitting: Nurse Practitioner

## 2018-03-31 ENCOUNTER — Encounter: Payer: Self-pay | Admitting: Nurse Practitioner

## 2018-03-31 VITALS — BP 149/67 | HR 52 | Temp 99.1°F | Resp 18 | Ht 68.0 in | Wt 182.0 lb

## 2018-03-31 DIAGNOSIS — K219 Gastro-esophageal reflux disease without esophagitis: Secondary | ICD-10-CM | POA: Insufficient documentation

## 2018-03-31 DIAGNOSIS — I10 Essential (primary) hypertension: Secondary | ICD-10-CM

## 2018-03-31 DIAGNOSIS — Z7984 Long term (current) use of oral hypoglycemic drugs: Secondary | ICD-10-CM | POA: Insufficient documentation

## 2018-03-31 DIAGNOSIS — Z955 Presence of coronary angioplasty implant and graft: Secondary | ICD-10-CM | POA: Insufficient documentation

## 2018-03-31 DIAGNOSIS — Z7982 Long term (current) use of aspirin: Secondary | ICD-10-CM | POA: Insufficient documentation

## 2018-03-31 DIAGNOSIS — Z79899 Other long term (current) drug therapy: Secondary | ICD-10-CM | POA: Insufficient documentation

## 2018-03-31 DIAGNOSIS — E1165 Type 2 diabetes mellitus with hyperglycemia: Secondary | ICD-10-CM

## 2018-03-31 DIAGNOSIS — Z8249 Family history of ischemic heart disease and other diseases of the circulatory system: Secondary | ICD-10-CM | POA: Insufficient documentation

## 2018-03-31 DIAGNOSIS — E785 Hyperlipidemia, unspecified: Secondary | ICD-10-CM | POA: Insufficient documentation

## 2018-03-31 DIAGNOSIS — I2510 Atherosclerotic heart disease of native coronary artery without angina pectoris: Secondary | ICD-10-CM | POA: Insufficient documentation

## 2018-03-31 DIAGNOSIS — I252 Old myocardial infarction: Secondary | ICD-10-CM | POA: Insufficient documentation

## 2018-03-31 DIAGNOSIS — E119 Type 2 diabetes mellitus without complications: Secondary | ICD-10-CM | POA: Insufficient documentation

## 2018-03-31 DIAGNOSIS — IMO0001 Reserved for inherently not codable concepts without codable children: Secondary | ICD-10-CM

## 2018-03-31 DIAGNOSIS — L409 Psoriasis, unspecified: Secondary | ICD-10-CM | POA: Insufficient documentation

## 2018-03-31 LAB — GLUCOSE, POCT (MANUAL RESULT ENTRY): POC GLUCOSE: 132 mg/dL — AB (ref 70–99)

## 2018-03-31 NOTE — Progress Notes (Signed)
Assessment & Plan:  Bradley Wolfe was seen today for follow-up.  Diagnoses and all orders for this visit:  Uncontrolled type 2 diabetes mellitus without complication, without long-term current use of insulin (HCC) -     Glucose (CBG) Continue blood sugar control as discussed in office today, low carbohydrate diet, and regular physical exercise as tolerated, 150 minutes per week (30 min each day, 5 days per week, or 50 min 3 days per week). Keep blood sugar logs with fasting goal of 80-130 mg/dl, post prandial less than 180.  For Hypoglycemia: BS <60 and Hyperglycemia BS >400; contact the clinic ASAP. Annual eye exams and foot exams are recommended.  A1c at next office visit.   Essential hypertension Continue all antihypertensives as prescribed.  Remember to bring in your blood pressure log with you for your follow up appointment.  DASH/Mediterranean Diets are healthier choices for HTN.  He does not need any refills today  Patient has been counseled on age-appropriate routine health concerns for screening and prevention. These are reviewed and up-to-date. Referrals have been placed accordingly. Immunizations are up-to-date or declined.    Subjective:   Chief Complaint  Patient presents with  . Follow-up    DM + HTN   HPI [name]. 47 y.o. male presents to office today for follow up to DM and HTN.   Essential Hypertension Chronic. Not well controlled today. However he endorses taking his medications as prescribed.  Endorses increased stress regarding financial issues and needing to get "my cars back on the road".  He reports he has 3 cars none of which are working at this time.  He does not check his blood pressure at home.  His Blood pressure has been reasonably controlled and I  not make any adjustments with his blood pressure medication today. Denies chest pain, shortness of breath, palpitations, lightheadedness, dizziness, headaches or BLE edema.  BP Readings from Last 3  Encounters:  03/31/18 (!) 149/67  12/28/17 133/82  11/15/17 115/64    Diabetes mellitus Type 2 He is not checking his blood sugars except for "every once in a while".  He does endorse medication compliance taking metformin '1000mg'$  BID. He endorses neuropathy in his feet however has not been taking his gabapentin. States "oh so that's what I am supposed to be taking that for?".  He denies any hypoglycemic symptoms.  He is overdue for an eye exam.  Needs to make an appointment with the financial counselor to obtain financial assistance which has currently expired.  Lab Results  Component Value Date   HGBA1C 6.7 12/28/2017    Review of Systems  Constitutional: Negative for fever, malaise/fatigue and weight loss.  HENT: Negative.  Negative for nosebleeds.   Eyes: Negative.  Negative for blurred vision, double vision and photophobia.  Respiratory: Negative.  Negative for cough and shortness of breath.   Cardiovascular: Negative.  Negative for chest pain, palpitations and leg swelling.  Gastrointestinal: Positive for heartburn. Negative for nausea and vomiting.  Musculoskeletal: Negative.  Negative for myalgias.  Skin: Positive for rash (psoriasis).  Neurological: Positive for sensory change (diabetic neuropathy). Negative for dizziness, focal weakness, seizures and headaches.  Psychiatric/Behavioral: Negative.  Negative for suicidal ideas.    Past Medical History:  Diagnosis Date  . CAD (coronary artery disease), native coronary artery 12/21/12   a. NSTEMI 2/14 => LHC 12/21/12: mLAD 90%, OM1 20%, mRCA 100%, EF 55-60%.  PCI: Xience Xpedition DES to mRCA and Xience Xpedition DES to mLAD.   [name]  Diabetes mellitus without complication ([place])   . GERD (gastroesophageal reflux disease)   . Hyperlipidemia   . Hypertension   . Psoriasis   . Tobacco abuse     Past Surgical History:  Procedure Laterality Date  . CORONARY ANGIOPLASTY WITH STENT PLACEMENT  12/21/12   90% mid LAD s/p DES, 20% OM1,  100% mid RCA s/p DES; LVEF 55-60%  . LEFT HEART CATHETERIZATION WITH CORONARY ANGIOGRAM N/A 12/21/2012   Procedure: LEFT HEART CATHETERIZATION WITH CORONARY ANGIOGRAM;  Surgeon: Burnell Blanks, MD;  Location: [facility] CATH LAB;  Service: Cardiovascular;  Laterality: N/A;    Family History  Problem Relation Age of Onset  . Heart attack Father   . Heart attack Mother     Social History Reviewed with no changes to be made today.   Outpatient Medications Prior to Visit  Medication Sig Dispense Refill  . albuterol (PROVENTIL HFA;VENTOLIN HFA) 108 (90 Base) MCG/ACT inhaler Inhale 2 puffs into the lungs every 6 (six) hours as needed for wheezing or shortness of breath. 1 Inhaler 2  . amLODipine (NORVASC) 10 MG tablet Take 1 tablet (10 mg total) by mouth daily. 90 tablet 0  . aspirin 81 MG EC tablet Take 1 tablet (81 mg total) by mouth daily. 30 tablet 12  . atorvastatin (LIPITOR) 80 MG tablet Take 1 tablet (80 mg total) by mouth daily. 90 tablet 0  . Blood Glucose Monitoring Suppl (TRUE METRIX GO GLUCOSE METER) w/Device KIT 1 each by Does not apply route every 8 (eight) hours as needed. 1 kit 0  . Coal Tar Extract (PSORIASIN EX) Apply 1 application topically as needed (for psoriasis).    . cyclobenzaprine (FLEXERIL) 10 MG tablet Take 1 tablet (10 mg total) by mouth 3 (three) times daily as needed for muscle spasms. 60 tablet 0  . glucose blood test strip Use as instructed 100 each 12  . lisinopril (PRINIVIL,ZESTRIL) 40 MG tablet Take 1 tablet (40 mg total) by mouth daily. 90 tablet 0  . metFORMIN (GLUCOPHAGE) 1000 MG tablet TAKE 1 TABLET BY MOUTH 2 TIMES DAILY WITH A MEAL. 180 tablet 0  . metoprolol succinate (TOPROL-XL) 25 MG 24 hr tablet Take 1 tablet (25 mg total) by mouth daily. 90 tablet 3  . nitroGLYCERIN (NITROSTAT) 0.4 MG SL tablet DISSOLVE ONE TABLET UNDER THE TONGUE EVERY 5 MINUTES AS NEEDED FOR CHEST PAIN.  DO NOT EXCEED A TOTAL OF 3 DOSES IN 15 MINUTES 25 tablet 1  . omeprazole  (PRILOSEC) 40 MG capsule Take 1 capsule (40 mg total) by mouth daily. 30 capsule 3  . TRUEPLUS LANCETS 26G MISC 1 each by Does not apply route every 8 (eight) hours as needed. 100 each 12  . acetaminophen (TYLENOL) 500 MG tablet Take 2 tablets (1,000 mg total) by mouth every 8 (eight) hours as needed for moderate pain. (Patient not taking: Reported on 11/15/2017) 30 tablet 0  . gabapentin (NEURONTIN) 300 MG capsule Take 1 capsule (300 mg total) by mouth at bedtime. (Patient not taking: Reported on 12/28/2017) 90 capsule 0  . naproxen (NAPROSYN) 500 MG tablet Take 1 tablet (500 mg total) by mouth 2 (two) times daily with a meal. (Patient not taking: Reported on 11/15/2017) 30 tablet 0   No facility-administered medications prior to visit.     No Known Allergies     Objective:    BP (!) 149/67 (BP Location: Left Arm, Patient Position: Sitting, Cuff Size: Normal)   Pulse (!) 52   Temp 99.1  F (37.3 C) (Oral)   Resp 18   Ht '5\' 8"'$  (1.727 m)   Wt 182 lb (82.6 kg)   SpO2 100%   BMI 27.67 kg/m  Wt Readings from Last 3 Encounters:  03/31/18 182 lb (82.6 kg)  12/28/17 194 lb 6.4 oz (88.2 kg)  11/15/17 195 lb (88.5 kg)    Physical Exam  Constitutional: He is oriented to person, place, and time. He appears well-developed and well-nourished. He is cooperative.  HENT:  Head: Normocephalic and atraumatic.  Eyes: EOM are normal.  Neck: Normal range of motion.  Cardiovascular: Regular rhythm and normal heart sounds. Bradycardia present. Exam reveals no gallop and no friction rub.  No murmur heard. Pulmonary/Chest: Effort normal and breath sounds normal. No tachypnea. No respiratory distress. He has no decreased breath sounds. He has no wheezes. He has no rhonchi. He has no rales. He exhibits no tenderness.  Abdominal: Soft. Bowel sounds are normal.  Musculoskeletal: Normal range of motion. He exhibits no edema.  Neurological: He is alert and oriented to person, place, and time. Coordination  normal.  Skin: Skin is warm and dry.  Psychiatric: He has a normal mood and affect. His behavior is normal. Judgment and thought content normal.  Nursing note and vitals reviewed.      Patient has been counseled extensively about nutrition and exercise as well as the importance of adherence with medications and regular follow-up. The patient was given clear instructions to go to ER or return to medical center if symptoms don't improve, worsen or new problems develop. The patient verbalized understanding.   Follow-up: Return in about 3 months (around 07/01/2018) for HTN/HPL/DM.   Gildardo Pounds, FNP-BC [facility] and [place], [place]   03/31/2018, 5:36 PM

## 2018-03-31 NOTE — Patient Instructions (Signed)
Cooking With Less Salt Cooking with less salt is one way to reduce the amount of sodium you get from food. Depending on your condition and overall health, your health care provider or diet and nutrition specialist (dietitian) may recommend that you reduce your sodium intake. Most people should have less than 2,300 milligrams (mg) of sodium each day. If you have high blood pressure (hypertension), you may need to limit your sodium to 1,500 mg each day. Follow the tips below to help reduce your sodium intake. What do I need to know about cooking with less salt? Shopping  Buy sodium-free or low-sodium products. Look for the following words on food labels: ? Low-sodium. ? Sodium-free. ? Reduced-sodium. ? No salt added. ? Unsalted.  Buy fresh or frozen vegetables. Avoid canned vegetables.  Avoid buying meats or protein foods that have been injected with broth or saline solution.  Avoid cured or smoked meats, such as hot dogs, bacon, salami, ham, and bologna. Reading food labels  Check the food label before buying or using packaged ingredients.  Look for products with no more than 140 mg of sodium in one serving.  Do not choose foods with salt as one of the first three ingredients on the ingredients list. If salt is one of the first three ingredients, it usually means the item is high in sodium, because ingredients are listed in order of amount in the food item. Cooking  Use herbs, seasonings without salt, and spices as substitutes for salt in foods.  Use sodium-free baking soda when baking.  Grill, braise, or roast foods to add flavor with less salt.  Avoid adding salt to pasta, rice, or hot cereals while cooking.  Drain and rinse canned vegetables before use.  Avoid adding salt when cooking sweets and desserts.  Cook with low-sodium ingredients. What are some salt alternatives? The following are herbs, seasonings, and spices that can be used instead of salt to give taste to your  food. Herbs should be fresh or dried. Do not choose packaged mixes. Next to the name of the herb, spice, or seasoning are some examples of foods you can pair it with. Herbs  Bay leaves - Soups, meat and vegetable dishes, and spaghetti sauce.  Basil - Italian dishes, soups, pasta, and fish dishes.  Cilantro - Meat, poultry, and vegetable dishes.  Chili powder - Marinades and Mexican dishes.  Chives - Salad dressings and potato dishes.  Cumin - Mexican dishes, couscous, and meat dishes.  Dill - Fish dishes, sauces, and salads.  Fennel - Meat and vegetable dishes, breads, and cookies.  Garlic (do not use garlic salt) - Italian dishes, meat dishes, salad dressings, and sauces.  Marjoram - Soups, potato dishes, and meat dishes.  Oregano - Pizza and spaghetti sauce.  Parsley - Salads, soups, pasta, and meat dishes.  Rosemary - Italian dishes, salad dressings, soups, and red meats.  Saffron - Fish dishes, pasta, and some poultry dishes.  Sage - Stuffings and sauces.  Tarragon - Fish and poultry dishes.  Thyme - Stuffing, meat, and fish dishes. Seasonings  Lemon juice - Fish dishes, poultry dishes, vegetables, and salads.  Vinegar - Salad dressings, vegetables, and fish dishes. Spices  Cinnamon - Sweet dishes, such as cakes, cookies, and puddings.  Cloves - Gingerbread, puddings, and marinades for meats.  Curry - Vegetable dishes, fish and poultry dishes, and stir-fry dishes.  Ginger - Vegetables dishes, fish dishes, and stir-fry dishes.  Nutmeg - Pasta, vegetables, poultry, fish dishes, and custard. What   are some low-sodium ingredients and foods?  Fresh or frozen fruits and vegetables with no sauce added.  Fresh or frozen whole meats, poultry, and fish with no sauce added.  Eggs.  Noodles, pasta, quinoa, rice.  Shredded or puffed wheat or puffed rice.  Regular or quick oats.  Milk, yogurt, hard cheeses, and low-sodium cheeses. Good cheese choices include  Swiss, Monterey Jack, and mozzarella. Always check the label for the serving size and sodium content.  Unsalted butter or margarine.  Unsalted nuts.  Sherbet or ice cream (keep to  cup per serving).  Homemade pudding.  Sodium-free baking soda and baking powder. This is not a complete list of low-sodium ingredients and foods. Contact your dietitian for more options. Summary  Cooking with less salt is one way to reduce the amount of sodium that you get from food.  Buy sodium-free or low-sodium products.  Check the food label before using or buying packaged ingredients.  Use herbs, seasonings without salt, and spices as substitutes for salt in foods. This information is not intended to replace advice given to you by your health care provider. Make sure you discuss any questions you have with your health care provider. Document Released: 10/11/2005 Document Revised: 10/19/2016 Document Reviewed: 10/19/2016 Elsevier Interactive Patient Education  2017 Elsevier Inc.  DASH Eating Plan DASH stands for "Dietary Approaches to Stop Hypertension." The DASH eating plan is a healthy eating plan that has been shown to reduce high blood pressure (hypertension). It may also reduce your risk for type 2 diabetes, heart disease, and stroke. The DASH eating plan may also help with weight loss. What are tips for following this plan? General guidelines  Avoid eating more than 2,300 mg (milligrams) of salt (sodium) a day. If you have hypertension, you may need to reduce your sodium intake to 1,500 mg a day.  Limit alcohol intake to no more than 1 drink a day for nonpregnant women and 2 drinks a day for men. One drink equals 12 oz of beer, 5 oz of wine, or 1 oz of hard liquor.  Work with your health care provider to maintain a healthy body weight or to lose weight. Ask what an ideal weight is for you.  Get at least 30 minutes of exercise that causes your heart to beat faster (aerobic exercise) most  days of the week. Activities may include walking, swimming, or biking.  Work with your health care provider or diet and nutrition specialist (dietitian) to adjust your eating plan to your individual calorie needs. Reading food labels  Check food labels for the amount of sodium per serving. Choose foods with less than 5 percent of the Daily Value of sodium. Generally, foods with less than 300 mg of sodium per serving fit into this eating plan.  To find whole grains, look for the word "whole" as the first word in the ingredient list. Shopping  Buy products labeled as "low-sodium" or "no salt added."  Buy fresh foods. Avoid canned foods and premade or frozen meals. Cooking  Avoid adding salt when cooking. Use salt-free seasonings or herbs instead of table salt or sea salt. Check with your health care provider or pharmacist before using salt substitutes.  Do not fry foods. Cook foods using healthy methods such as baking, boiling, grilling, and broiling instead.  Cook with heart-healthy oils, such as olive, canola, soybean, or sunflower oil. Meal planning   Eat a balanced diet that includes: ? 5 or more servings of fruits and vegetables each day.   At each meal, try to fill half of your plate with fruits and vegetables. ? Up to 6-8 servings of whole grains each day. ? Less than 6 oz of lean meat, poultry, or fish each day. A 3-oz serving of meat is about the same size as a deck of cards. One egg equals 1 oz. ? 2 servings of low-fat dairy each day. ? A serving of nuts, seeds, or beans 5 times each week. ? Heart-healthy fats. Healthy fats called Omega-3 fatty acids are found in foods such as flaxseeds and coldwater fish, like sardines, salmon, and mackerel.  Limit how much you eat of the following: ? Canned or prepackaged foods. ? Food that is high in trans fat, such as fried foods. ? Food that is high in saturated fat, such as fatty meat. ? Sweets, desserts, sugary drinks, and other foods  with added sugar. ? Full-fat dairy products.  Do not salt foods before eating.  Try to eat at least 2 vegetarian meals each week.  Eat more home-cooked food and less restaurant, buffet, and fast food.  When eating at a restaurant, ask that your food be prepared with less salt or no salt, if possible. What foods are recommended? The items listed may not be a complete list. Talk with your dietitian about what dietary choices are best for you. Grains Whole-grain or whole-wheat bread. Whole-grain or whole-wheat pasta. Brown rice. Oatmeal. Quinoa. Bulgur. Whole-grain and low-sodium cereals. Pita bread. Low-fat, low-sodium crackers. Whole-wheat flour tortillas. Vegetables Fresh or frozen vegetables (raw, steamed, roasted, or grilled). Low-sodium or reduced-sodium tomato and vegetable juice. Low-sodium or reduced-sodium tomato sauce and tomato paste. Low-sodium or reduced-sodium canned vegetables. Fruits All fresh, dried, or frozen fruit. Canned fruit in natural juice (without added sugar). Meat and other protein foods Skinless chicken or turkey. Ground chicken or turkey. Pork with fat trimmed off. Fish and seafood. Egg whites. Dried beans, peas, or lentils. Unsalted nuts, nut butters, and seeds. Unsalted canned beans. Lean cuts of beef with fat trimmed off. Low-sodium, lean deli meat. Dairy Low-fat (1%) or fat-free (skim) milk. Fat-free, low-fat, or reduced-fat cheeses. Nonfat, low-sodium ricotta or cottage cheese. Low-fat or nonfat yogurt. Low-fat, low-sodium cheese. Fats and oils Soft margarine without trans fats. Vegetable oil. Low-fat, reduced-fat, or light mayonnaise and salad dressings (reduced-sodium). Canola, safflower, olive, soybean, and sunflower oils. Avocado. Seasoning and other foods Herbs. Spices. Seasoning mixes without salt. Unsalted popcorn and pretzels. Fat-free sweets. What foods are not recommended? The items listed may not be a complete list. Talk with your dietitian about  what dietary choices are best for you. Grains Baked goods made with fat, such as croissants, muffins, or some breads. Dry pasta or rice meal packs. Vegetables Creamed or fried vegetables. Vegetables in a cheese sauce. Regular canned vegetables (not low-sodium or reduced-sodium). Regular canned tomato sauce and paste (not low-sodium or reduced-sodium). Regular tomato and vegetable juice (not low-sodium or reduced-sodium). Pickles. Olives. Fruits Canned fruit in a light or heavy syrup. Fried fruit. Fruit in cream or butter sauce. Meat and other protein foods Fatty cuts of meat. Ribs. Fried meat. Bacon. Sausage. Bologna and other processed lunch meats. Salami. Fatback. Hotdogs. Bratwurst. Salted nuts and seeds. Canned beans with added salt. Canned or smoked fish. Whole eggs or egg yolks. Chicken or turkey with skin. Dairy Whole or 2% milk, cream, and half-and-half. Whole or full-fat cream cheese. Whole-fat or sweetened yogurt. Full-fat cheese. Nondairy creamers. Whipped toppings. Processed cheese and cheese spreads. Fats and oils Butter. Stick margarine.   Lard. Shortening. Ghee. Bacon fat. Tropical oils, such as coconut, palm kernel, or palm oil. Seasoning and other foods Salted popcorn and pretzels. Onion salt, garlic salt, seasoned salt, table salt, and sea salt. Worcestershire sauce. Tartar sauce. Barbecue sauce. Teriyaki sauce. Soy sauce, including reduced-sodium. Steak sauce. Canned and packaged gravies. Fish sauce. Oyster sauce. Cocktail sauce. Horseradish that you find on the shelf. Ketchup. Mustard. Meat flavorings and tenderizers. Bouillon cubes. Hot sauce and Tabasco sauce. Premade or packaged marinades. Premade or packaged taco seasonings. Relishes. Regular salad dressings. Where to find more information:  National Heart, Lung, and Blood Institute: www.nhlbi.nih.gov  American Heart Association: www.heart.org Summary  The DASH eating plan is a healthy eating plan that has been shown to  reduce high blood pressure (hypertension). It may also reduce your risk for type 2 diabetes, heart disease, and stroke.  With the DASH eating plan, you should limit salt (sodium) intake to 2,300 mg a day. If you have hypertension, you may need to reduce your sodium intake to 1,500 mg a day.  When on the DASH eating plan, aim to eat more fresh fruits and vegetables, whole grains, lean proteins, low-fat dairy, and heart-healthy fats.  Work with your health care provider or diet and nutrition specialist (dietitian) to adjust your eating plan to your individual calorie needs. This information is not intended to replace advice given to you by your health care provider. Make sure you discuss any questions you have with your health care provider. Document Released: 09/30/2011 Document Revised: 10/04/2016 Document Reviewed: 10/04/2016 Elsevier Interactive Patient Education  2018 Elsevier Inc.  

## 2018-05-04 IMAGING — US US ABDOMEN COMPLETE
1 series · 14 of 25 positions shown · non-contrast
Comparison: CT of the abdomen and pelvis from 05/12/2011

CLINICAL DATA: Acute onset of nausea and intermittent generalized
chest pain. Initial encounter.

EXAM:
ABDOMEN ULTRASOUND COMPLETE

[Series 1: us abdomen complete · 0.22mm/px · 14 of 92 slices shown]
[im 1/92]
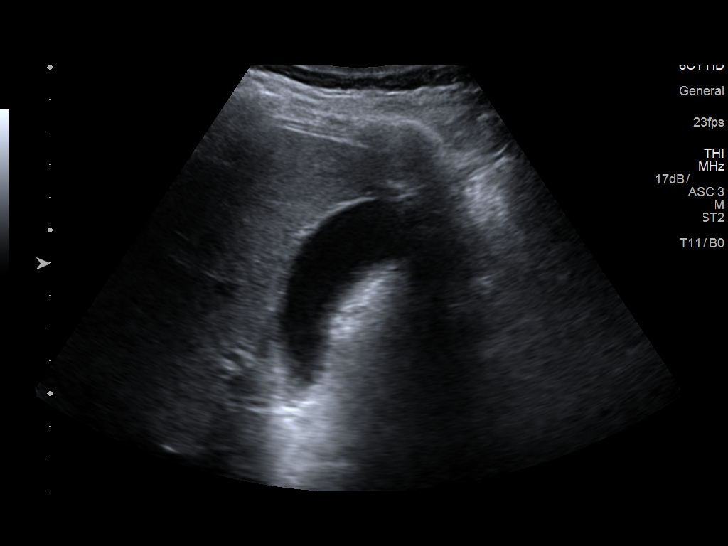
[im 8/92]
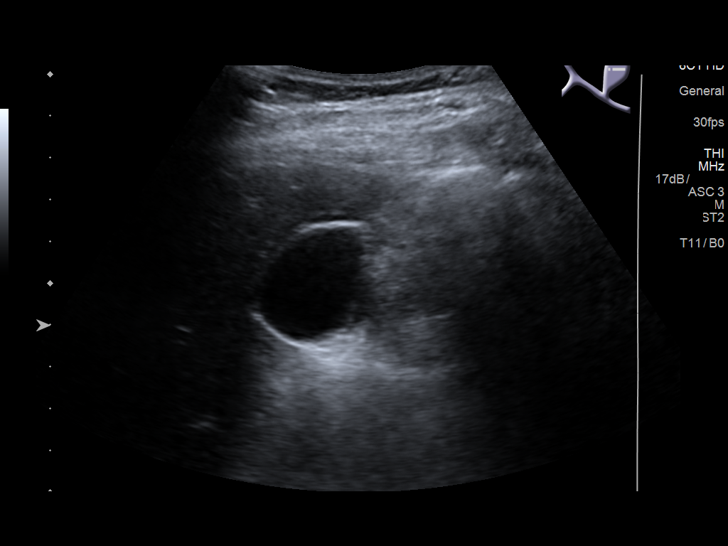
[im 16/92]
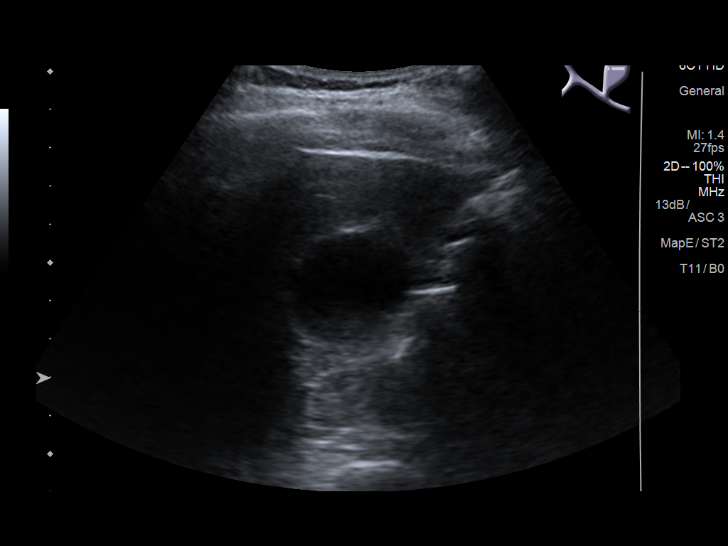
[im 23/92]
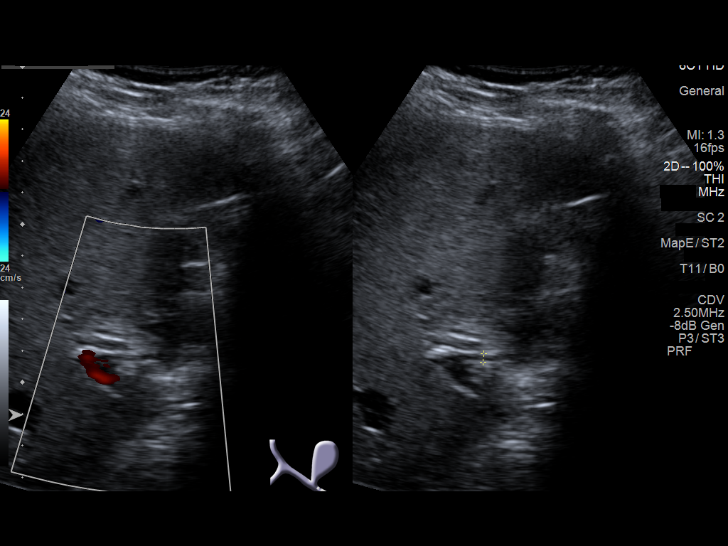
[im 31/92]
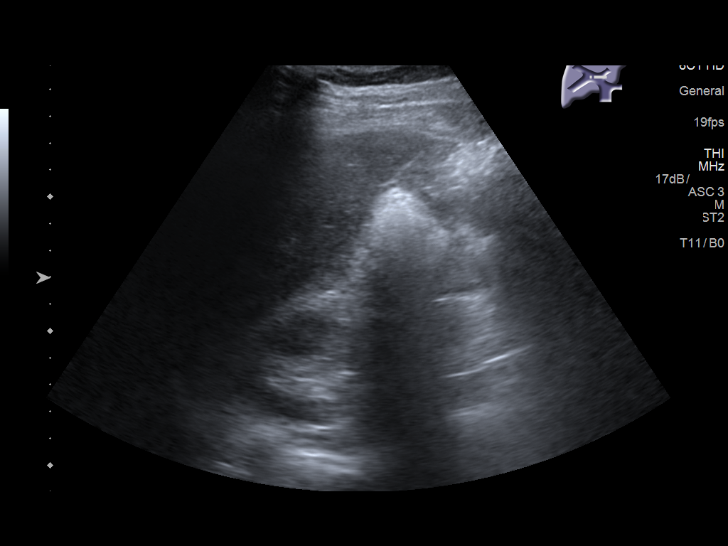
[im 35/92]
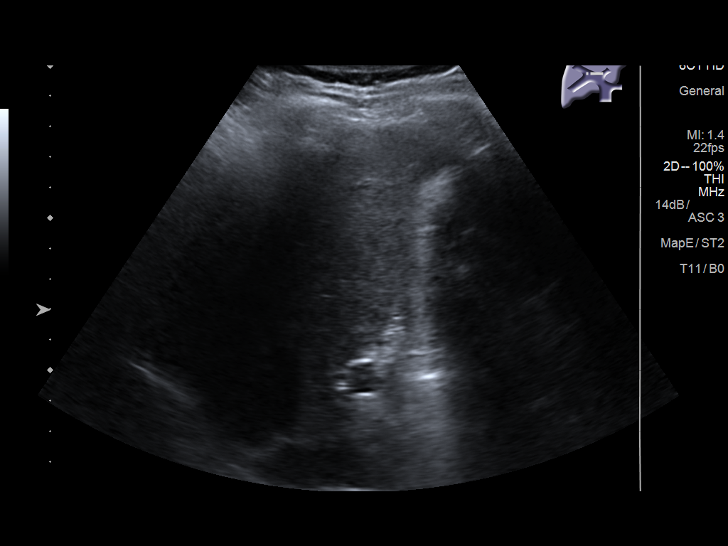
[im 42/92]
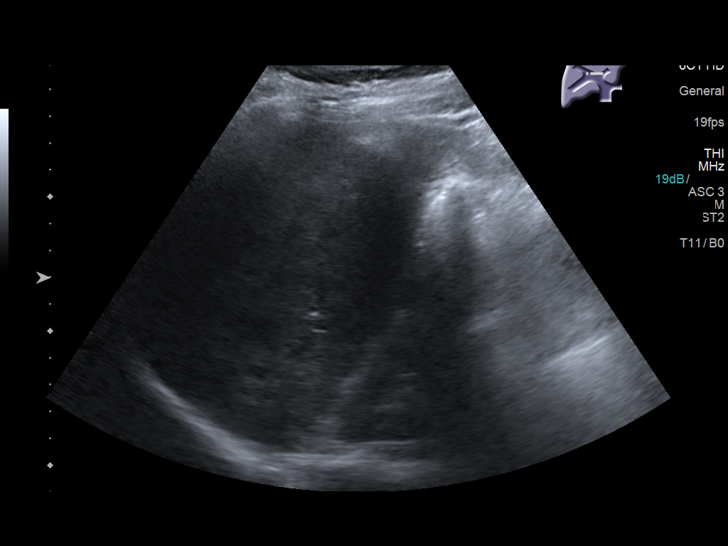
[im 50/92]
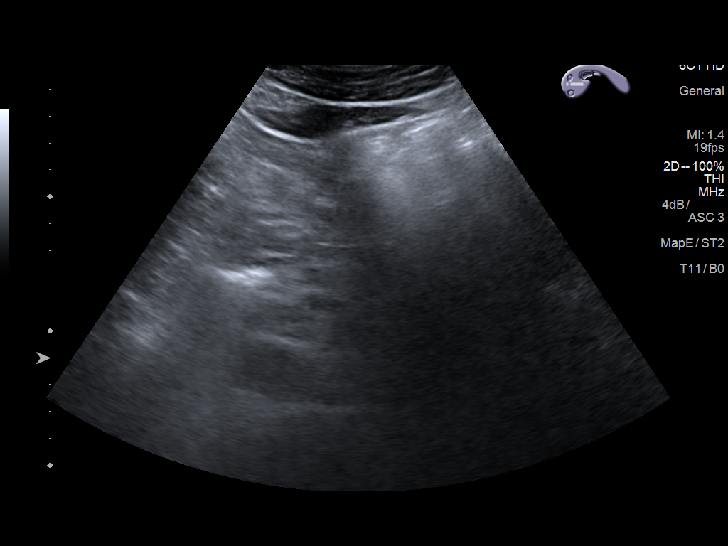
[im 57/92]
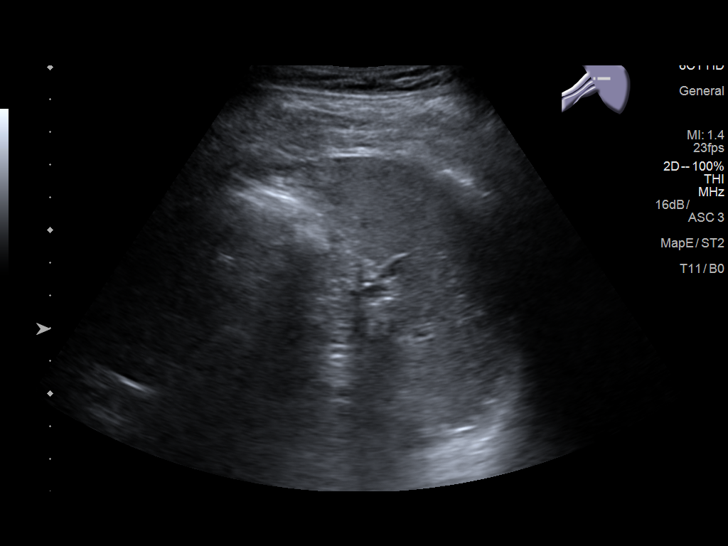
[im 61/92]
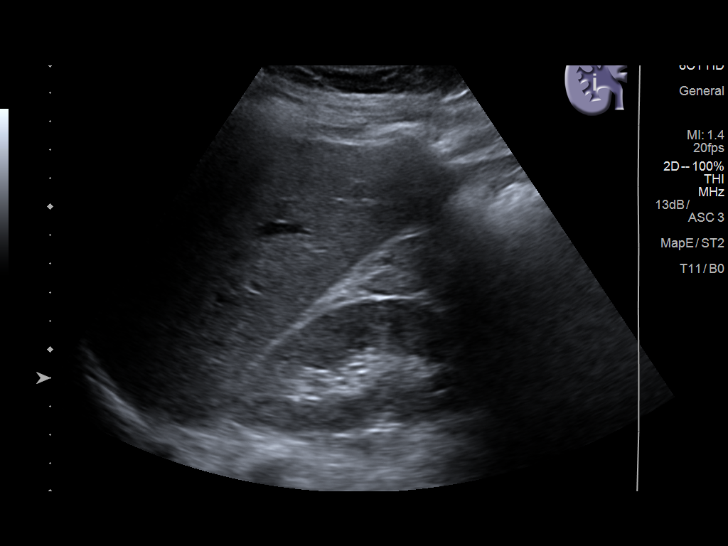
[im 69/92]
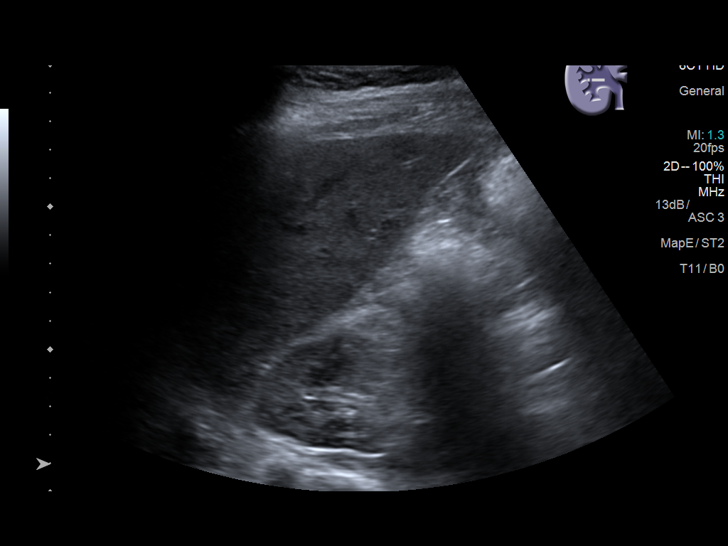
[im 76/92]
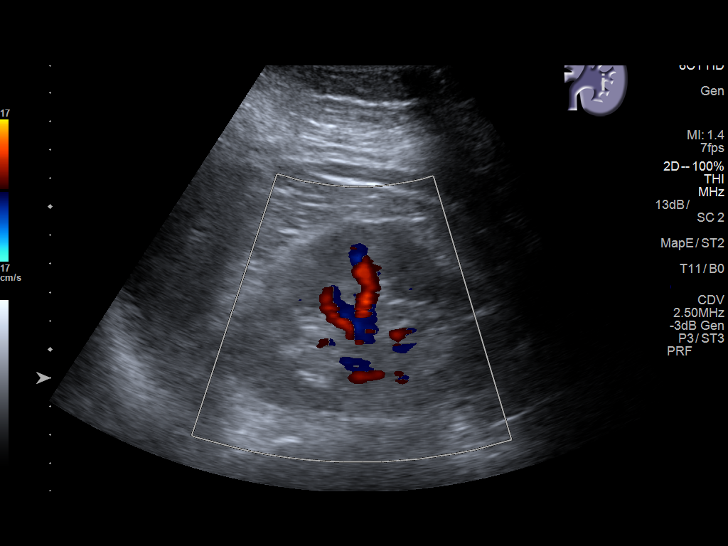
[im 84/92]
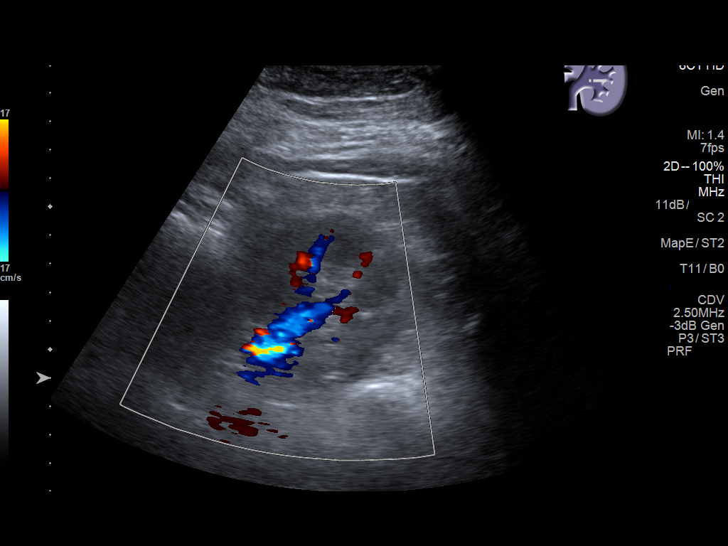
[im 92/92]
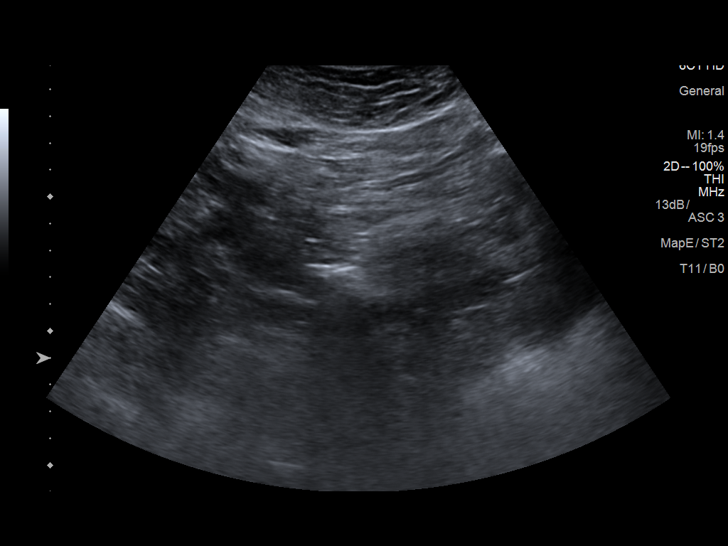

[14 of 25 positions shown; findings below may reference images not displayed]

FINDINGS: Gallbladder: No gallstones or wall thickening visualized. No
sonographic Murphy sign noted by sonographer.

Common bile duct: Diameter: 0.2 cm, within normal limits in caliber.

Liver: No focal lesion identified. Within normal limits in
parenchymal echogenicity.

IVC: No abnormality visualized.

Pancreas: Visualized portion unremarkable.

Spleen: Size and appearance within normal limits.

Right Kidney: Length: 9.2 cm. Echogenicity within normal limits. No
mass or hydronephrosis visualized.

Left Kidney: Length: 9.9 cm. Echogenicity within normal limits. No
mass or hydronephrosis visualized.

Abdominal aorta: No aneurysm visualized. Difficult to fully
characterize due to overlying bowel gas.

Other findings: None.
IMPRESSION: Unremarkable abdominal ultrasound.

## 2018-05-09 MED FILL — OMEPRAZOLE DR 40 MG CAPSULE: 40 | 30 days supply | Qty: 30 | Fill #3

## 2018-05-09 MED FILL — METOPROLOL SUCCINATE ER 25: 25 | 30 days supply | Qty: 30 | Fill #3

## 2018-05-10 ENCOUNTER — Other Ambulatory Visit: Payer: Self-pay

## 2018-05-10 DIAGNOSIS — Z760 Encounter for issue of repeat prescription: Secondary | ICD-10-CM

## 2018-05-10 DIAGNOSIS — IMO0001 Reserved for inherently not codable concepts without codable children: Secondary | ICD-10-CM

## 2018-05-10 DIAGNOSIS — E1165 Type 2 diabetes mellitus with hyperglycemia: Principal | ICD-10-CM

## 2018-05-11 MED ORDER — LISINOPRIL 40 MG PO TABS: 40.0000 mg | ORAL_TABLET | Freq: Every day | ORAL | 2 refills | Status: DC

## 2018-05-11 MED ORDER — AMLODIPINE BESYLATE 10 MG PO TABS: 10.0000 mg | ORAL_TABLET | Freq: Every day | ORAL | 2 refills | Status: DC

## 2018-05-11 MED ORDER — METFORMIN HCL 1000 MG PO TABS: ORAL_TABLET | ORAL | 2 refills | Status: DC

## 2018-05-11 MED FILL — ?METFORMIN HCL 1,000 MG TAB: 1000 | 30 days supply | Qty: 60 | Fill #0

## 2018-05-11 MED FILL — AMLODIPINE BESYLATE 10 MG T: 10 | 30 days supply | Qty: 30 | Fill #0

## 2018-05-11 MED FILL — LISINOPRIL 40 MG TABLET: 40 | 30 days supply | Qty: 30 | Fill #0

## 2018-06-27 ENCOUNTER — Ambulatory Visit: Payer: Self-pay | Admitting: Nurse Practitioner

## 2018-06-28 ENCOUNTER — Other Ambulatory Visit: Payer: Self-pay | Admitting: Nurse Practitioner

## 2018-06-28 DIAGNOSIS — K219 Gastro-esophageal reflux disease without esophagitis: Secondary | ICD-10-CM

## 2018-06-28 MED FILL — LISINOPRIL 40 MG TABLET: 40 | 30 days supply | Qty: 30 | Fill #1

## 2018-06-28 MED FILL — METOPROLOL SUCCINATE ER 25: 25 | 30 days supply | Qty: 30 | Fill #4

## 2018-06-28 MED FILL — ?METFORMIN HCL 1000MG TABS: 1000 | 30 days supply | Qty: 60 | Fill #1

## 2018-06-28 MED FILL — AMLODIPINE BESYLATE 10 MG T: 10 | 30 days supply | Qty: 30 | Fill #1

## 2018-06-28 MED FILL — GABAPENTIN 300 MG CAPSULE: 300 | 30 days supply | Qty: 30 | Fill #2

## 2018-07-11 IMAGING — DX DG CHEST 2V
2 series · 2 of 2 positions shown · non-contrast
Comparison: 12/21/2012

CLINICAL DATA: Chest pain.

EXAM:
CHEST  2 VIEW

[chest pa]
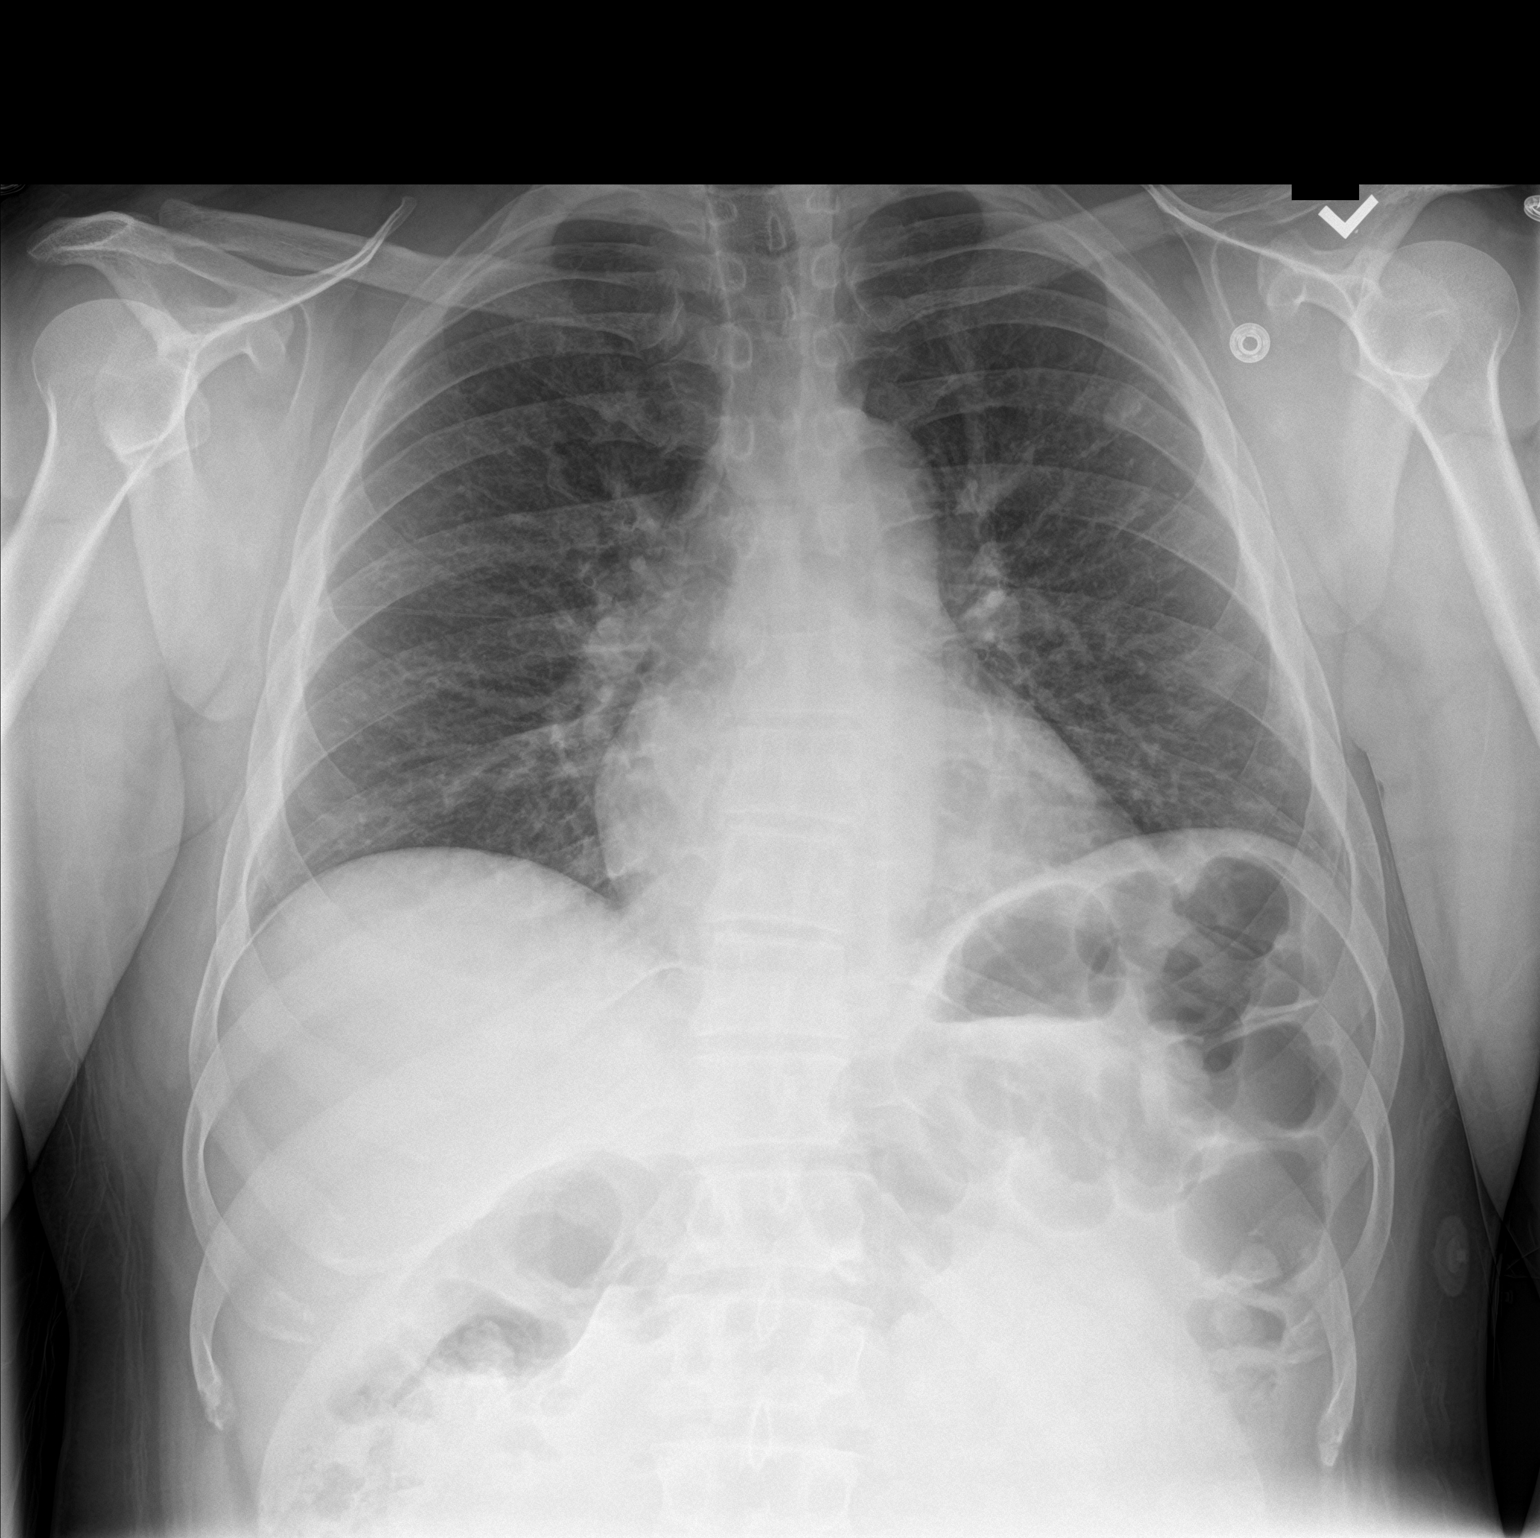

[chest lat]
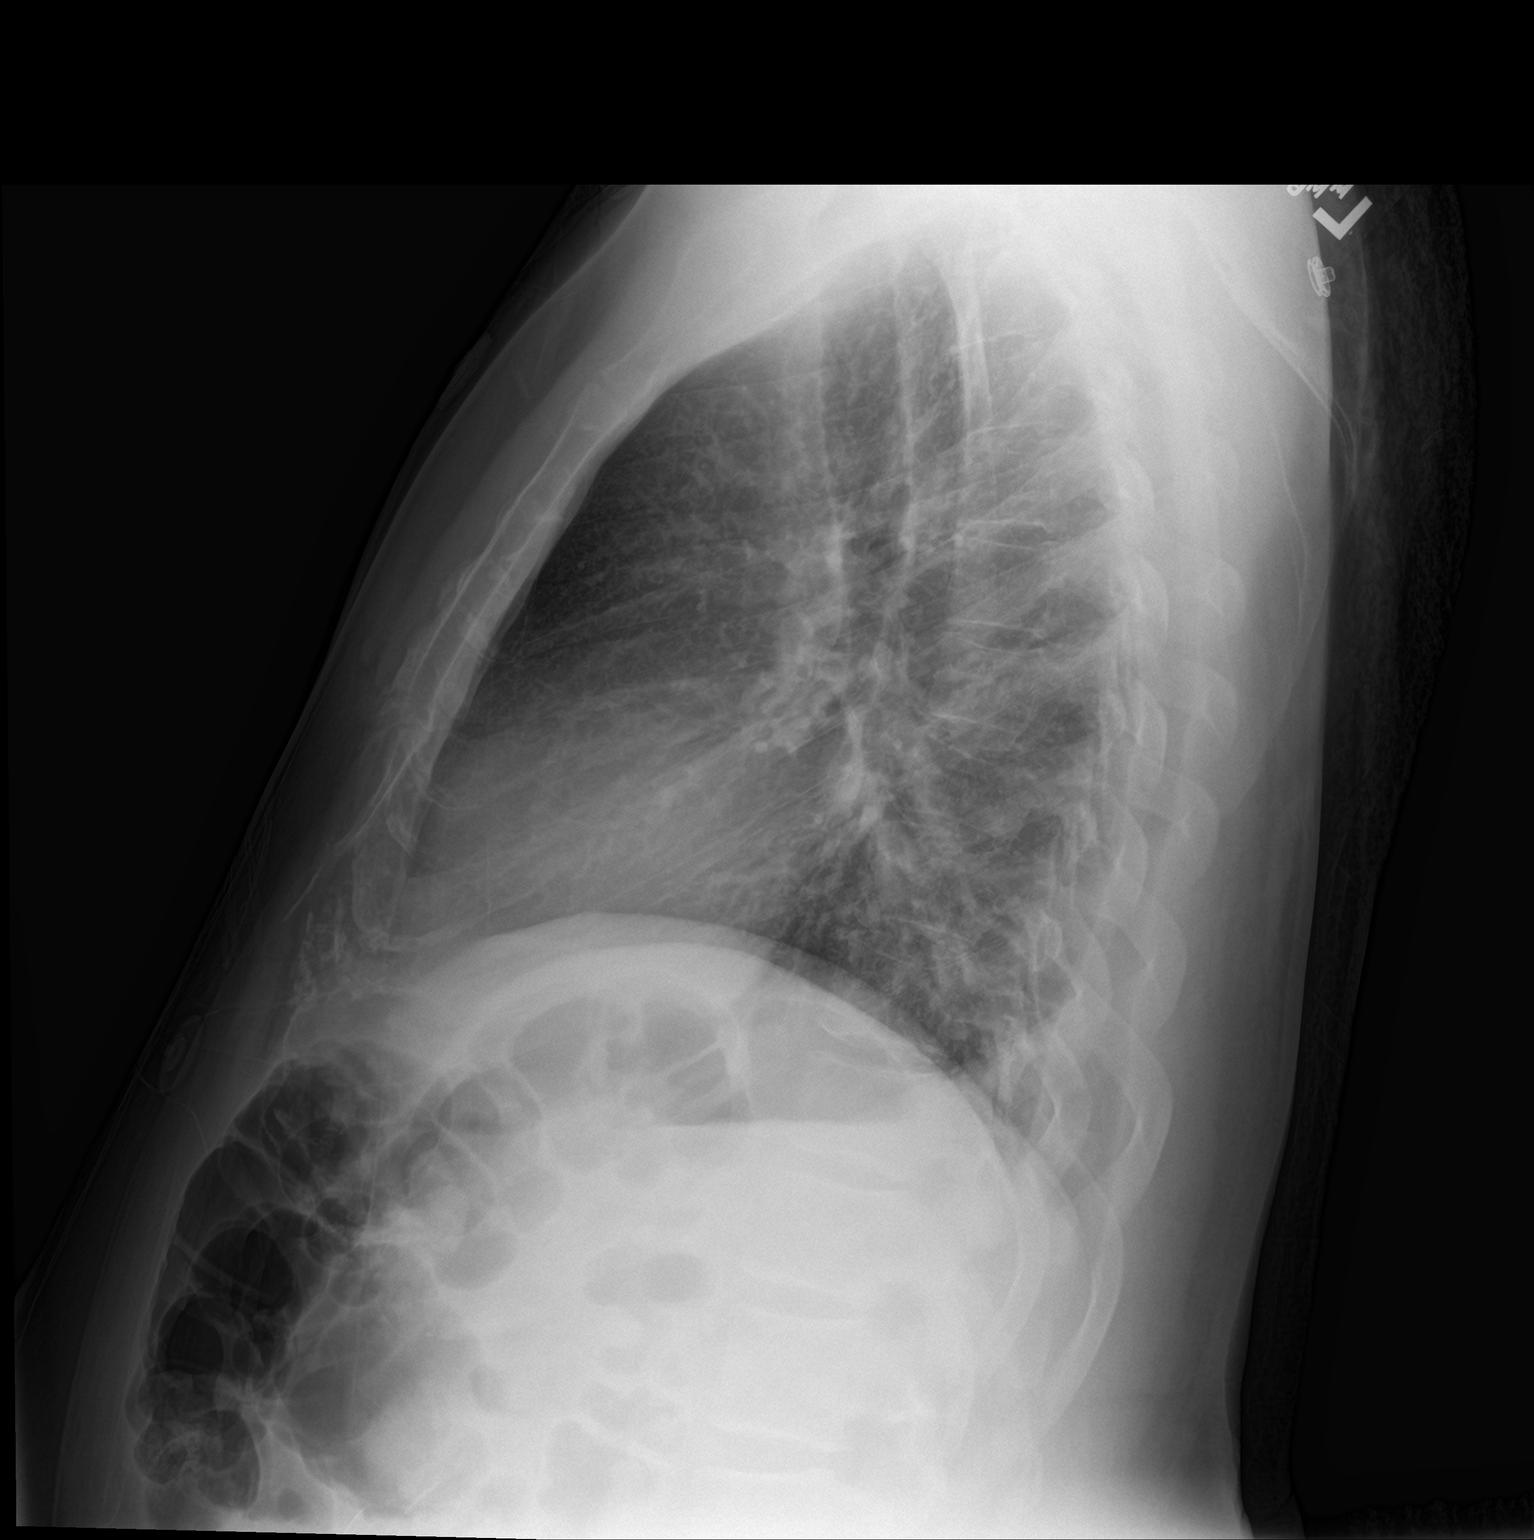

[2 of 2 positions shown; findings below may reference images not displayed]

FINDINGS: Low lung volumes with crowding of bronchovascular tree are. The
heart size is normal. No pulmonary edema, pleural effusion, focal
airspace disease or pneumothorax. No acute osseous abnormality is
seen.
IMPRESSION: No active cardiopulmonary disease.

## 2018-07-17 MED FILL — OMEPRAZOLE DR 40 MG CAPSULE: 40 | 30 days supply | Qty: 30 | Fill #0

## 2018-07-19 ENCOUNTER — Encounter: Payer: Self-pay | Admitting: Nurse Practitioner

## 2018-07-19 ENCOUNTER — Ambulatory Visit: Payer: Self-pay | Attending: Nurse Practitioner | Admitting: Nurse Practitioner

## 2018-07-19 VITALS — BP 169/88 | HR 53 | Temp 98.3°F | Ht 68.0 in | Wt 176.0 lb

## 2018-07-19 DIAGNOSIS — Z9119 Patient's noncompliance with other medical treatment and regimen: Secondary | ICD-10-CM | POA: Insufficient documentation

## 2018-07-19 DIAGNOSIS — Z8249 Family history of ischemic heart disease and other diseases of the circulatory system: Secondary | ICD-10-CM | POA: Insufficient documentation

## 2018-07-19 DIAGNOSIS — K219 Gastro-esophageal reflux disease without esophagitis: Secondary | ICD-10-CM | POA: Insufficient documentation

## 2018-07-19 DIAGNOSIS — IMO0001 Reserved for inherently not codable concepts without codable children: Secondary | ICD-10-CM

## 2018-07-19 DIAGNOSIS — Z79899 Other long term (current) drug therapy: Secondary | ICD-10-CM | POA: Insufficient documentation

## 2018-07-19 DIAGNOSIS — I2510 Atherosclerotic heart disease of native coronary artery without angina pectoris: Secondary | ICD-10-CM | POA: Insufficient documentation

## 2018-07-19 DIAGNOSIS — E785 Hyperlipidemia, unspecified: Secondary | ICD-10-CM | POA: Insufficient documentation

## 2018-07-19 DIAGNOSIS — Z7984 Long term (current) use of oral hypoglycemic drugs: Secondary | ICD-10-CM | POA: Insufficient documentation

## 2018-07-19 DIAGNOSIS — I10 Essential (primary) hypertension: Secondary | ICD-10-CM | POA: Insufficient documentation

## 2018-07-19 DIAGNOSIS — I252 Old myocardial infarction: Secondary | ICD-10-CM | POA: Insufficient documentation

## 2018-07-19 DIAGNOSIS — L409 Psoriasis, unspecified: Secondary | ICD-10-CM | POA: Insufficient documentation

## 2018-07-19 DIAGNOSIS — E1165 Type 2 diabetes mellitus with hyperglycemia: Secondary | ICD-10-CM | POA: Insufficient documentation

## 2018-07-19 LAB — POCT GLYCOSYLATED HEMOGLOBIN (HGB A1C): Hemoglobin A1C: 6.8 % — AB (ref 4.0–5.6)

## 2018-07-19 LAB — GLUCOSE, POCT (MANUAL RESULT ENTRY): POC Glucose: 80 mg/dl (ref 70–99)

## 2018-07-19 MED ORDER — BETAMETHASONE DIPROPIONATE 0.05 % EX CREA: TOPICAL_CREAM | Freq: Two times a day (BID) | CUTANEOUS | 1 refills | Status: DC

## 2018-07-19 MED FILL — BETAMETHASONE DP 0.05% CRM: 0.05 | 20 days supply | Qty: 45 | Fill #0

## 2018-07-19 NOTE — Progress Notes (Signed)
Assessment & Plan:  Bradley Wolfe was seen today for follow-up.  Bradley Wolfe was seen today for follow-up.  Diagnoses and all orders for this visit:  Uncontrolled type 2 diabetes mellitus without complication, without long-term current use of insulin (HCC) -     Glucose (CBG) -     HgB A1c Continue blood sugar control as discussed in office today, low carbohydrate diet, and regular physical exercise as tolerated, 150 minutes per week (30 min each day, 5 days per week, or 50 min 3 days per week). Keep blood sugar logs with fasting goal of 90-130 mg/dl, post prandial (after you eat) less than 180.  For Hypoglycemia: BS <60 and Hyperglycemia BS >400; contact the clinic ASAP. Annual eye exams and foot exams are recommended.   Psoriasis -     betamethasone dipropionate (DIPROLENE) 0.05 % cream; Apply topically 2 (two) times daily.  Essential hypertension Continue all antihypertensives as prescribed.  Remember to bring in your blood pressure log with you for your follow up appointment.  DASH/Mediterranean Diets are healthier choices for HTN.      Patient has been counseled on age-appropriate routine health concerns for screening and prevention. These are reviewed and up-to-date. Referrals have been placed accordingly. Immunizations are up-to-date or declined.    Subjective:   Chief Complaint  Patient presents with  . Follow-up    Pt. is here to follow-up on diabetes and hypertension.    HPI Bradley Wolfe. 47 y.o. male presents to office today for DM and HTN.   CHRONIC HYPERTENSION Disease Monitoring  Blood pressure range BP Readings from Last 3 Encounters:  07/19/18 (!) 169/88  03/31/18 (!) 149/67  12/28/17 133/82   Chest pain: no   Dyspnea: no   Claudication: no  Medication compliance: no  Medication Side Effects  Lightheadedness: no   Urinary frequency: no   Edema: no   Impotence: no  Preventitive Healthcare:  Exercise: no   Diet Pattern: diet: general  Salt  Restriction:  no   Type 2 Diabetes Mellitus Disease course has been stable. Symptoms are stable. There are diabetic complications. Risk factors for coronary artery disease include family history, dyslipidemia, diabetes mellitus,  hypertension. Current diabetic treatment includes metformin 1000 mg BID. Patient is not compliant with treatment all of the time and does not monitor blood glucose frequently.  Weight is  stable. Patient follows a generally healthy diet. Meal planning includes avoidance of concentrated sweets. Patient has not seen a dietician. Patient is not compliant with exercise.   An ACE inhibitor/angiotensin II receptor blocker is being taken. Patient does not see a podiatrist. Eye exam is not current.   Lab Results  Component Value Date   HGBA1C 6.8 (A) 07/19/2018   Lab Results  Component Value Date   HGBA1C 6.7 12/28/2017    Psoriasis Patient complains of psoriasis.  Symptoms have been ongoing since childhood. The patient reports symptoms of itching, scaling, pain, primarily affecting the elbows, arms, knees, legs. Lesions appear to be exacerbated by no known precipitant. Treatments tried so far include tar (coal), result no change, with inadequate improvement. History of other significant skin problems: no. Family History of skin disease: unknown. He needs to be referred to dermatology. Patient has been advised to apply for financial assistance and schedule to see our financial counselor.    Review of Systems  Constitutional: Negative for fever, malaise/fatigue and weight loss.  HENT: Negative.  Negative for nosebleeds.   Eyes: Negative.  Negative for blurred vision, double  vision and photophobia.  Respiratory: Negative.  Negative for cough and shortness of breath.   Cardiovascular: Negative.  Negative for chest pain, palpitations and leg swelling.  Gastrointestinal: Positive for heartburn. Negative for nausea and vomiting.  Musculoskeletal: Positive for back pain. Negative  for myalgias.  Skin:       psoriasis  Neurological: Negative.  Negative for dizziness, focal weakness, seizures and headaches.  Psychiatric/Behavioral: Negative.  Negative for suicidal ideas.    Past Medical History:  Diagnosis Date  . CAD (coronary artery disease), native coronary artery 12/21/12   a. NSTEMI 2/14 => LHC 12/21/12: mLAD 90%, OM1 20%, mRCA 100%, EF 55-60%.  PCI: Xience Xpedition DES to mRCA and Xience Xpedition DES to mLAD.   . Diabetes mellitus without complication ([place])   . GERD (gastroesophageal reflux disease)   . Hyperlipidemia   . Hypertension   . Psoriasis   . Tobacco abuse     Past Surgical History:  Procedure Laterality Date  . CORONARY ANGIOPLASTY WITH STENT PLACEMENT  12/21/12   90% mid LAD s/p DES, 20% OM1, 100% mid RCA s/p DES; LVEF 55-60%  . LEFT HEART CATHETERIZATION WITH CORONARY ANGIOGRAM N/A 12/21/2012   Procedure: LEFT HEART CATHETERIZATION WITH CORONARY ANGIOGRAM;  Surgeon: Burnell Blanks, MD;  Location: [facility] CATH LAB;  Service: Cardiovascular;  Laterality: N/A;    Family History  Problem Relation Age of Onset  . Heart attack Father   . Heart attack Mother     Social History Reviewed with no changes to be made today.   Outpatient Medications Prior to Visit  Medication Sig Dispense Refill  . albuterol (PROVENTIL HFA;VENTOLIN HFA) 108 (90 Base) MCG/ACT inhaler Inhale 2 puffs into the lungs every 6 (six) hours as needed for wheezing or shortness of breath. 1 Inhaler 2  . amLODipine (NORVASC) 10 MG tablet Take 1 tablet (10 mg total) by mouth daily. 30 tablet 2  . aspirin 81 MG EC tablet Take 1 tablet (81 mg total) by mouth daily. 30 tablet 12  . Blood Glucose Monitoring Suppl (TRUE METRIX GO GLUCOSE METER) w/Device KIT 1 each by Does not apply route every 8 (eight) hours as needed. 1 kit 0  . glucose blood test strip Use as instructed 100 each 12  . lisinopril (PRINIVIL,ZESTRIL) 40 MG tablet Take 1 tablet (40 mg total) by mouth daily. 30  tablet 2  . metFORMIN (GLUCOPHAGE) 1000 MG tablet TAKE 1 TABLET BY MOUTH 2 TIMES DAILY WITH A MEAL. 60 tablet 2  . metoprolol succinate (TOPROL-XL) 25 MG 24 hr tablet Take 1 tablet (25 mg total) by mouth daily. 90 tablet 3  . nitroGLYCERIN (NITROSTAT) 0.4 MG SL tablet DISSOLVE ONE TABLET UNDER THE TONGUE EVERY 5 MINUTES AS NEEDED FOR CHEST PAIN.  DO NOT EXCEED A TOTAL OF 3 DOSES IN 15 MINUTES 25 tablet 1  . omeprazole (PRILOSEC) 40 MG capsule TAKE 1 CAPSULE BY MOUTH DAILY. 30 capsule 2  . TRUEPLUS LANCETS 26G MISC 1 each by Does not apply route every 8 (eight) hours as needed. 100 each 12  . acetaminophen (TYLENOL) 500 MG tablet Take 2 tablets (1,000 mg total) by mouth every 8 (eight) hours as needed for moderate pain. (Patient not taking: Reported on 11/15/2017) 30 tablet 0  . atorvastatin (LIPITOR) 80 MG tablet Take 1 tablet (80 mg total) by mouth daily. 90 tablet 0  . Coal Tar Extract (PSORIASIN EX) Apply 1 application topically as needed (for psoriasis).    . cyclobenzaprine (FLEXERIL) 10 MG  tablet Take 1 tablet (10 mg total) by mouth 3 (three) times daily as needed for muscle spasms. (Patient not taking: Reported on 07/19/2018) 60 tablet 0  . gabapentin (NEURONTIN) 300 MG capsule Take 1 capsule (300 mg total) by mouth at bedtime. (Patient not taking: Reported on 12/28/2017) 90 capsule 0   No facility-administered medications prior to visit.     No Known Allergies     Objective:    BP (!) 169/88 (BP Location: Left Arm, Patient Position: Sitting, Cuff Size: Normal)   Pulse (!) 53   Temp 98.3 F (36.8 C) (Oral)   Ht '5\' 8"'$  (1.727 m)   Wt 176 lb (79.8 kg)   SpO2 100%   BMI 26.76 kg/m  Wt Readings from Last 3 Encounters:  07/19/18 176 lb (79.8 kg)  03/31/18 182 lb (82.6 kg)  12/28/17 194 lb 6.4 oz (88.2 kg)    Physical Exam  Constitutional: He is oriented to person, place, and time. He appears well-developed and well-nourished. He is cooperative.  HENT:  Head: Normocephalic and  atraumatic.  Eyes: EOM are normal.  Neck: Normal range of motion.  Cardiovascular: Normal rate, regular rhythm and normal heart sounds. Exam reveals no gallop and no friction rub.  No murmur heard. Pulmonary/Chest: Effort normal and breath sounds normal. No tachypnea. No respiratory distress. He has no decreased breath sounds. He has no wheezes. He has no rhonchi. He has no rales. He exhibits no tenderness.  Abdominal: Bowel sounds are normal.  Musculoskeletal: Normal range of motion. He exhibits no edema.  Neurological: He is alert and oriented to person, place, and time. Coordination normal.  Skin: Skin is warm and dry. Rash (scaly, plaque appearing lesions on bilateral arms, legs, feet) noted.  Psychiatric: He has a normal mood and affect. His behavior is normal. Judgment and thought content normal.  Nursing note and vitals reviewed.      Patient has been counseled extensively about nutrition and exercise as well as the importance of adherence with medications and regular follow-up. The patient was given clear instructions to go to ER or return to medical center if symptoms don't improve, worsen or new problems develop. The patient verbalized understanding.   Follow-up: Return in about 3 months (around 10/09/2018) for HTN.   Gildardo Pounds, FNP-BC [facility] and [place], [place]   07/19/2018, 3:09 PM

## 2018-08-08 MED FILL — LISINOPRIL 40 MG TABLET: 40 | 30 days supply | Qty: 30 | Fill #2

## 2018-08-08 MED FILL — metFORMIN HCL 1000 MG TABS: 1000 | 30 days supply | Qty: 60 | Fill #2

## 2018-08-08 MED FILL — METOPROLOL SUCCINATE ER 25: 25 | 30 days supply | Qty: 30 | Fill #5

## 2018-08-08 MED FILL — BETAMETHASONE DP 0.05% CRM: 0.05 | 20 days supply | Qty: 45 | Fill #1

## 2018-08-08 MED FILL — AMLODIPINE BESYLATE 10 MG T: 10 | 30 days supply | Qty: 30 | Fill #2

## 2018-08-11 ENCOUNTER — Other Ambulatory Visit: Payer: Self-pay

## 2018-08-11 DIAGNOSIS — E1165 Type 2 diabetes mellitus with hyperglycemia: Principal | ICD-10-CM

## 2018-08-11 DIAGNOSIS — IMO0001 Reserved for inherently not codable concepts without codable children: Secondary | ICD-10-CM

## 2018-08-11 DIAGNOSIS — E1140 Type 2 diabetes mellitus with diabetic neuropathy, unspecified: Secondary | ICD-10-CM

## 2018-08-13 MED ORDER — GABAPENTIN 300 MG PO CAPS: 300.0000 mg | ORAL_CAPSULE | Freq: Every day | ORAL | 0 refills | Status: DC

## 2018-08-23 MED FILL — GABAPENTIN 300 MG CAPSULE: 300 | 30 days supply | Qty: 30 | Fill #0

## 2018-09-20 ENCOUNTER — Other Ambulatory Visit: Payer: Self-pay | Admitting: Nurse Practitioner

## 2018-09-20 DIAGNOSIS — Z760 Encounter for issue of repeat prescription: Secondary | ICD-10-CM

## 2018-09-20 MED FILL — OMEPRAZOLE DR 40 MG CAPSULE: 40 | 30 days supply | Qty: 30 | Fill #1

## 2018-09-20 MED FILL — LISINOPRIL 40 MG TABLET: 40 | 30 days supply | Qty: 30 | Fill #0

## 2018-09-20 MED FILL — AMLODIPINE BESYLATE 10 MG T: 10 | 30 days supply | Qty: 30 | Fill #0

## 2018-09-20 MED FILL — METOPROLOL SUCCINATE ER 25: 25 | 30 days supply | Qty: 30 | Fill #6

## 2018-10-09 MED FILL — BETAMETHASONE DP 0.05% CRM: 0.05 | 20 days supply | Qty: 45 | Fill #2

## 2018-10-09 MED FILL — GABAPENTIN 300 MG CAPSULE: 300 | 30 days supply | Qty: 30 | Fill #1

## 2018-10-11 ENCOUNTER — Encounter: Payer: Self-pay | Admitting: Nurse Practitioner

## 2018-10-11 ENCOUNTER — Other Ambulatory Visit: Payer: Self-pay

## 2018-10-11 ENCOUNTER — Ambulatory Visit: Payer: Self-pay | Attending: Nurse Practitioner | Admitting: Nurse Practitioner

## 2018-10-11 VITALS — BP 153/76 | HR 51 | Temp 98.2°F | Ht 68.0 in | Wt 184.2 lb

## 2018-10-11 DIAGNOSIS — I10 Essential (primary) hypertension: Secondary | ICD-10-CM | POA: Insufficient documentation

## 2018-10-11 DIAGNOSIS — Z79899 Other long term (current) drug therapy: Secondary | ICD-10-CM | POA: Insufficient documentation

## 2018-10-11 DIAGNOSIS — IMO0001 Reserved for inherently not codable concepts without codable children: Secondary | ICD-10-CM

## 2018-10-11 DIAGNOSIS — E785 Hyperlipidemia, unspecified: Secondary | ICD-10-CM | POA: Insufficient documentation

## 2018-10-11 DIAGNOSIS — I2510 Atherosclerotic heart disease of native coronary artery without angina pectoris: Secondary | ICD-10-CM | POA: Insufficient documentation

## 2018-10-11 DIAGNOSIS — Z7982 Long term (current) use of aspirin: Secondary | ICD-10-CM | POA: Insufficient documentation

## 2018-10-11 DIAGNOSIS — E1165 Type 2 diabetes mellitus with hyperglycemia: Secondary | ICD-10-CM | POA: Insufficient documentation

## 2018-10-11 DIAGNOSIS — Z7984 Long term (current) use of oral hypoglycemic drugs: Secondary | ICD-10-CM | POA: Insufficient documentation

## 2018-10-11 DIAGNOSIS — K219 Gastro-esophageal reflux disease without esophagitis: Secondary | ICD-10-CM | POA: Insufficient documentation

## 2018-10-11 DIAGNOSIS — E119 Type 2 diabetes mellitus without complications: Secondary | ICD-10-CM

## 2018-10-11 DIAGNOSIS — Z8249 Family history of ischemic heart disease and other diseases of the circulatory system: Secondary | ICD-10-CM | POA: Insufficient documentation

## 2018-10-11 DIAGNOSIS — L409 Psoriasis, unspecified: Secondary | ICD-10-CM | POA: Insufficient documentation

## 2018-10-11 DIAGNOSIS — I252 Old myocardial infarction: Secondary | ICD-10-CM | POA: Insufficient documentation

## 2018-10-11 DIAGNOSIS — Z955 Presence of coronary angioplasty implant and graft: Secondary | ICD-10-CM | POA: Insufficient documentation

## 2018-10-11 DIAGNOSIS — I25118 Atherosclerotic heart disease of native coronary artery with other forms of angina pectoris: Secondary | ICD-10-CM

## 2018-10-11 LAB — POCT GLYCOSYLATED HEMOGLOBIN (HGB A1C): HBA1C, POC (CONTROLLED DIABETIC RANGE): 6.9 % (ref 0.0–7.0)

## 2018-10-11 LAB — GLUCOSE, POCT (MANUAL RESULT ENTRY): POC Glucose: 94 mg/dl (ref 70–99)

## 2018-10-11 MED ORDER — NITROGLYCERIN 0.4 MG SL SUBL: SUBLINGUAL_TABLET | SUBLINGUAL | 1 refills | Status: DC

## 2018-10-11 MED ORDER — BETAMETHASONE DIPROPIONATE AUG 0.05 % EX OINT: TOPICAL_OINTMENT | Freq: Two times a day (BID) | CUTANEOUS | 1 refills | Status: DC

## 2018-10-11 MED FILL — NITROGLYCERIN 0.4 MG TAB SL: 0.4 | 25 days supply | Qty: 25 | Fill #0

## 2018-10-11 NOTE — Progress Notes (Signed)
Assessment & Plan:  Charod was seen today for hypertension.  Diagnoses and all orders for this visit:  Uncontrolled type 2 diabetes mellitus without complication, without long-term current use of insulin (HCC) -     Glucose (CBG) -     POCT glycosylated hemoglobin (Hb A1C) Controlled Continue medications as prescribed.  Continue blood sugar control as discussed in office today, low carbohydrate diet, and regular physical exercise as tolerated, 150 minutes per week (30 min each day, 5 days per week, or 50 min 3 days per week). Keep blood sugar logs with fasting goal of 90-130 mg/dl, post prandial (after you eat) less than 180.  For Hypoglycemia: BS <60 and Hyperglycemia BS >400; contact the clinic ASAP. Annual eye exams and foot exams are recommended.  Essential hypertension Continue all antihypertensives as prescribed.  Remember to bring in your blood pressure log with you for your follow up appointment.  DASH/Mediterranean Diets are healthier choices for HTN.  F/U in 2-3 weeks for BP recheck  Psoriasis -     augmented betamethasone dipropionate (DIPROLENE) 0.05 % ointment; Apply topically 2 (two) times daily. Chronic and well controlled with betamethasone.    Patient has been counseled on age-appropriate routine health concerns for screening and prevention. These are reviewed and up-to-date. Referrals have been placed accordingly. Immunizations are up-to-date or declined.    Subjective:   Chief Complaint  Patient presents with  . Hypertension   HPI Ree Edman. 47 y.o. male presents to office today for follow up.    DM TYPE 2 Chronic and well controlled. He endorses medication compliance taking metformin 1000 mg BID. He denies any hypo or hyperglycemic symptoms. He is overdue for eye exam. Patient has been advised to apply for financial assistance and schedule to see our financial counselor.  Lab Results  Component Value Date   HGBA1C 6.9 10/11/2018     Essential Hypertension Blood pressure is elevated today. He denies chest pain, shortness of breath, palpitations, lightheadedness, dizziness, headaches or BLE edema. He endorses medication NON compliance taking Amlodipine 10 mg daily, metoprolol 25 mg and lisinopril 40 mg. I have instructed him that is difficult to manage his blood pressure here in the office and adjust medications when he has not taken his medications. I have instructed him to take his medications as prescribed and we will recheck his blood pressure in a few weeks.  BP Readings from Last 3 Encounters:  10/11/18 (!) 153/76  07/19/18 (!) 169/88  03/31/18 (!) 149/67    Review of Systems  Constitutional: Negative for fever, malaise/fatigue and weight loss.  HENT: Negative.  Negative for nosebleeds.   Eyes: Negative.  Negative for blurred vision, double vision and photophobia.  Respiratory: Negative.  Negative for cough and shortness of breath.   Cardiovascular: Negative.  Negative for chest pain, palpitations and leg swelling.  Gastrointestinal: Negative.  Negative for heartburn, nausea and vomiting.  Musculoskeletal: Negative.  Negative for myalgias.  Skin: Positive for itching and rash.  Neurological: Negative.  Negative for dizziness, focal weakness, seizures and headaches.  Psychiatric/Behavioral: Negative.  Negative for suicidal ideas.    Past Medical History:  Diagnosis Date  . CAD (coronary artery disease), native coronary artery 12/21/12   a. NSTEMI 2/14 => LHC 12/21/12: mLAD 90%, OM1 20%, mRCA 100%, EF 55-60%.  PCI: Xience Xpedition DES to mRCA and Xience Xpedition DES to mLAD.   . Diabetes mellitus without complication ([place])   . GERD (gastroesophageal reflux disease)   . Hyperlipidemia   .  Hypertension   . Psoriasis   . Tobacco abuse     Past Surgical History:  Procedure Laterality Date  . CORONARY ANGIOPLASTY WITH STENT PLACEMENT  12/21/12   90% mid LAD s/p DES, 20% OM1, 100% mid RCA s/p DES; LVEF  55-60%  . LEFT HEART CATHETERIZATION WITH CORONARY ANGIOGRAM N/A 12/21/2012   Procedure: LEFT HEART CATHETERIZATION WITH CORONARY ANGIOGRAM;  Surgeon: Burnell Blanks, MD;  Location: [facility] CATH LAB;  Service: Cardiovascular;  Laterality: N/A;    Family History  Problem Relation Age of Onset  . Heart attack Father   . Heart attack Mother     Social History Reviewed with no changes to be made today.   Outpatient Medications Prior to Visit  Medication Sig Dispense Refill  . acetaminophen (TYLENOL) 500 MG tablet Take 2 tablets (1,000 mg total) by mouth every 8 (eight) hours as needed for moderate pain. 30 tablet 0  . albuterol (PROVENTIL HFA;VENTOLIN HFA) 108 (90 Base) MCG/ACT inhaler Inhale 2 puffs into the lungs every 6 (six) hours as needed for wheezing or shortness of breath. 1 Inhaler 2  . amLODipine (NORVASC) 10 MG tablet TAKE 1 TABLET BY MOUTH DAILY. 30 tablet 2  . aspirin 81 MG EC tablet Take 1 tablet (81 mg total) by mouth daily. 30 tablet 12  . atorvastatin (LIPITOR) 80 MG tablet Take 1 tablet (80 mg total) by mouth daily. 90 tablet 0  . Blood Glucose Monitoring Suppl (TRUE METRIX GO GLUCOSE METER) w/Device KIT 1 each by Does not apply route every 8 (eight) hours as needed. 1 kit 0  . cyclobenzaprine (FLEXERIL) 10 MG tablet Take 1 tablet (10 mg total) by mouth 3 (three) times daily as needed for muscle spasms. 60 tablet 0  . gabapentin (NEURONTIN) 300 MG capsule Take 1 capsule (300 mg total) by mouth at bedtime. 90 capsule 0  . glucose blood test strip Use as instructed 100 each 12  . lisinopril (PRINIVIL,ZESTRIL) 40 MG tablet TAKE 1 TABLET BY MOUTH DAILY. 30 tablet 2  . metFORMIN (GLUCOPHAGE) 1000 MG tablet TAKE 1 TABLET BY MOUTH 2 TIMES DAILY WITH A MEAL. 60 tablet 2  . metoprolol succinate (TOPROL-XL) 25 MG 24 hr tablet Take 1 tablet (25 mg total) by mouth daily. 90 tablet 3  . nitroGLYCERIN (NITROSTAT) 0.4 MG SL tablet DISSOLVE ONE TABLET UNDER THE TONGUE EVERY 5 MINUTES AS  NEEDED FOR CHEST PAIN.  DO NOT EXCEED A TOTAL OF 3 DOSES IN 15 MINUTES 25 tablet 1  . omeprazole (PRILOSEC) 40 MG capsule TAKE 1 CAPSULE BY MOUTH DAILY. 30 capsule 2  . TRUEPLUS LANCETS 26G MISC 1 each by Does not apply route every 8 (eight) hours as needed. 100 each 12  . betamethasone dipropionate (DIPROLENE) 0.05 % cream Apply topically 2 (two) times daily. 100 g 1  . Coal Tar Extract (PSORIASIN EX) Apply 1 application topically as needed (for psoriasis).     No facility-administered medications prior to visit.     No Known Allergies     Objective:    BP (!) 153/76   Pulse (!) 51   Temp 98.2 F (36.8 C) (Oral)   Ht '5\' 8"'$  (1.727 m)   Wt 184 lb 3.2 oz (83.6 kg)   SpO2 97%   BMI 28.01 kg/m  Wt Readings from Last 3 Encounters:  10/11/18 184 lb 3.2 oz (83.6 kg)  07/19/18 176 lb (79.8 kg)  03/31/18 182 lb (82.6 kg)    Physical Exam Vitals signs and  nursing note reviewed.  Constitutional:      Appearance: He is well-developed.  HENT:     Head: Normocephalic and atraumatic.  Neck:     Musculoskeletal: Normal range of motion.  Cardiovascular:     Rate and Rhythm: Normal rate and regular rhythm.     Heart sounds: Normal heart sounds. No murmur. No friction rub. No gallop.   Pulmonary:     Effort: Pulmonary effort is normal. No tachypnea or respiratory distress.     Breath sounds: Normal breath sounds. No decreased breath sounds, wheezing, rhonchi or rales.  Chest:     Chest wall: No tenderness.  Abdominal:     General: Bowel sounds are normal.     Palpations: Abdomen is soft.  Musculoskeletal: Normal range of motion.  Skin:    General: Skin is warm and dry.     Comments: Plaque psoriasis on bilateral hands   Neurological:     Mental Status: He is alert and oriented to person, place, and time.     Coordination: Coordination normal.  Psychiatric:        Behavior: Behavior normal. Behavior is cooperative.        Thought Content: Thought content normal.        Judgment:  Judgment normal.       Patient has been counseled extensively about nutrition and exercise as well as the importance of adherence with medications and regular follow-up. The patient was given clear instructions to go to ER or return to medical center if symptoms don't improve, worsen or new problems develop. The patient verbalized understanding.   Follow-up: Return in about 2 weeks (around 10/25/2018) for BP recheck with [facility] .   Gildardo Pounds, FNP-BC [facility] and [place], [place]   10/12/2018, 11:14 PM

## 2018-10-12 ENCOUNTER — Encounter: Payer: Self-pay | Admitting: Nurse Practitioner

## 2018-10-26 ENCOUNTER — Encounter: Payer: Self-pay | Admitting: Pharmacist

## 2018-10-30 MED FILL — OMEPRAZOLE DR 40 MG CAPSULE: 40 | 30 days supply | Qty: 30 | Fill #2

## 2018-10-30 MED FILL — AMLODIPINE BESYLATE 10 MG T: 10 | 30 days supply | Qty: 30 | Fill #1

## 2018-10-30 MED FILL — LISINOPRIL 40 MG TABLET: 40 | 30 days supply | Qty: 30 | Fill #1

## 2018-10-30 MED FILL — METOPROLOL SUCCINATE ER 25: 25 | 30 days supply | Qty: 30 | Fill #7

## 2018-11-02 ENCOUNTER — Ambulatory Visit: Payer: Self-pay | Attending: Nurse Practitioner | Admitting: Pharmacist

## 2018-11-02 VITALS — BP 160/76 | HR 75

## 2018-11-02 DIAGNOSIS — I10 Essential (primary) hypertension: Secondary | ICD-10-CM | POA: Insufficient documentation

## 2018-11-02 DIAGNOSIS — Z833 Family history of diabetes mellitus: Secondary | ICD-10-CM | POA: Insufficient documentation

## 2018-11-02 DIAGNOSIS — Z87891 Personal history of nicotine dependence: Secondary | ICD-10-CM | POA: Insufficient documentation

## 2018-11-02 DIAGNOSIS — E1165 Type 2 diabetes mellitus with hyperglycemia: Secondary | ICD-10-CM | POA: Insufficient documentation

## 2018-11-02 DIAGNOSIS — Z79899 Other long term (current) drug therapy: Secondary | ICD-10-CM | POA: Insufficient documentation

## 2018-11-02 DIAGNOSIS — IMO0001 Reserved for inherently not codable concepts without codable children: Secondary | ICD-10-CM

## 2018-11-02 DIAGNOSIS — Z8249 Family history of ischemic heart disease and other diseases of the circulatory system: Secondary | ICD-10-CM | POA: Insufficient documentation

## 2018-11-02 MED ORDER — ATORVASTATIN CALCIUM 80 MG PO TABS: 80.0000 mg | ORAL_TABLET | Freq: Every day | ORAL | 2 refills | Status: DC

## 2018-11-02 MED FILL — ATORVASTATIN 80 MG TABLET: 80 | 30 days supply | Qty: 30 | Fill #0

## 2018-11-02 NOTE — Patient Instructions (Signed)
Thank you for coming to see Bradley Wolfe today.   Blood pressure today is elevated.  Start taking your amlodipine at night before bed.   Continue taking metoprolol and lisinopril in the morning.   Limiting salt and caffeine, as well as exercising as able for at least 30 minutes for 5 days out of the week, can also help you lower your blood pressure.  Take your blood pressure at home if you are able. Please write down these numbers and bring them to your visits.  If you have any questions about medications, please call me [phone].  Franky Macho

## 2018-11-02 NOTE — Progress Notes (Signed)
   S:    PCP: Zelda   Patient arrives in good spirits. Presents to the clinic for hypertension management. Patient was referred by Zelda on 10/11/18. BP was elevated d/t non-compliance. Zelda made no changes to medications.   Patient denies adherence with medications. He denies chest pain, shortness of breath, HA, or blurred vision.  Current BP Medications include:  Amlodipine 10 mg daily, lisinopril 40 mg daily, Toprol XL 25 mg daily  Dietary habits include: does not limit salt, drinks soda throughout the day Exercise habits include:none outside of work Family / Social history: former smoker, drinks alcohol rarely, uses marijuana occasionally (has not today)  ASCVD risk factors include: NSTEMI, CAD, T2DM, HTN, hyperlipidemia  Home BP readings: doesn't take at home  O:  L arm after 5 minutes rest: 160/76, HR 75  Last 3 Office BP readings: BP Readings from Last 3 Encounters:  11/02/18 (!) 160/76  10/11/18 (!) 153/76  07/19/18 (!) 169/88   BMET    Component Value Date/Time   NA 140 12/28/2017 1203   K 4.0 12/28/2017 1203   CL 103 12/28/2017 1203   CO2 25 12/28/2017 1203   GLUCOSE 86 12/28/2017 1203   GLUCOSE 91 09/28/2016 1538   BUN 10 12/28/2017 1203   CREATININE 0.75 (L) 12/28/2017 1203   CREATININE 1.02 09/28/2016 1538   CALCIUM 9.2 12/28/2017 1203   GFRNONAA 109 12/28/2017 1203   GFRNONAA 88 09/28/2016 1538   GFRAA 126 12/28/2017 1203   GFRAA >89 09/28/2016 1538   Renal function: CrCl cannot be calculated (Patient's most recent lab result is older than the maximum 21 days allowed.).  Clinical ASCVD: Yes  The ASCVD Risk score Denman George DC Jr., et al., 2013) failed to calculate for the following reasons:   The patient has a prior MI or stroke diagnosis  A/P: Hypertension longstanding currently uncontrolled on current medications. BP Goal <130/80 mmHg. Patient is not adherent with current medications. Adherence encouraged. Pt asked to take amlodipine in the evening and  continue Toprol XL and lisinopril in the morning. I will see him in 1 month.  -Continued current regimen.  -Counseled on lifestyle modifications for blood pressure control including reduced dietary sodium, increased exercise, adequate sleep  Results reviewed and written information provided. Total time in face-to-face counseling 15 minutes.   F/U Clinic Visit in 1 month.    Patient seen with: Arletha Pili, PharmD Candidate  [employer]  Class of 2022  Butch Penny, PharmD, CPP Clinical Pharmacist [facility] [phone]

## 2018-11-03 ENCOUNTER — Encounter: Payer: Self-pay | Admitting: Pharmacist

## 2018-11-21 MED FILL — METOPROLOL SUCCINATE ER 25: 25 | 30 days supply | Qty: 30 | Fill #8

## 2018-11-21 MED FILL — ATORVASTATIN 80 MG TABLET: 80 | 30 days supply | Qty: 30 | Fill #1

## 2018-11-21 MED FILL — GABAPENTIN 300 MG CAPSULE: 300 | 30 days supply | Qty: 30 | Fill #2

## 2018-11-21 MED FILL — LISINOPRIL 40 MG TABLET: 40 | 30 days supply | Qty: 30 | Fill #2

## 2018-11-30 ENCOUNTER — Encounter: Payer: Self-pay | Admitting: Pharmacist

## 2018-11-30 NOTE — Progress Notes (Deleted)
   S:    PCP: Zelda   Patient arrives in good spirits. Presents to the clinic for hypertension management. Patient was referred by Zelda on 10/11/18. BP was elevated d/t non-compliance. Zelda made no changes to medications.   Patient denies adherence with medications. He denies chest pain, shortness of breath, HA, or blurred vision.  Current BP Medications include:  Amlodipine 10 mg daily, lisinopril 40 mg daily, Toprol XL 25 mg daily  Dietary habits include: does not limit salt, drinks soda throughout the day Exercise habits include:none outside of work Family / Social history: former smoker, drinks alcohol rarely, uses marijuana occasionally (has not today)  ASCVD risk factors include: NSTEMI, CAD, T2DM, HTN, hyperlipidemia  Home BP readings: doesn't take at home  O:  L arm after 5 minutes rest: 160/76, HR 75  Last 3 Office BP readings: BP Readings from Last 3 Encounters:  11/02/18 (!) 160/76  10/11/18 (!) 153/76  07/19/18 (!) 169/88   BMET    Component Value Date/Time   NA 140 12/28/2017 1203   K 4.0 12/28/2017 1203   CL 103 12/28/2017 1203   CO2 25 12/28/2017 1203   GLUCOSE 86 12/28/2017 1203   GLUCOSE 91 09/28/2016 1538   BUN 10 12/28/2017 1203   CREATININE 0.75 (L) 12/28/2017 1203   CREATININE 1.02 09/28/2016 1538   CALCIUM 9.2 12/28/2017 1203   GFRNONAA 109 12/28/2017 1203   GFRNONAA 88 09/28/2016 1538   GFRAA 126 12/28/2017 1203   GFRAA >89 09/28/2016 1538   Renal function: CrCl cannot be calculated (Patient's most recent lab result is older than the maximum 21 days allowed.).  Clinical ASCVD: Yes  The ASCVD Risk score (Goff DC Jr., et al., 2013) failed to calculate for the following reasons:   The patient has a prior MI or stroke diagnosis  A/P: Hypertension longstanding currently uncontrolled on current medications. BP Goal <130/80 mmHg. Patient is not adherent with current medications. Adherence encouraged. Pt asked to take amlodipine in the evening and  continue Toprol XL and lisinopril in the morning. I will see him in 1 month.  -Continued current regimen.  -Counseled on lifestyle modifications for blood pressure control including reduced dietary sodium, increased exercise, adequate sleep  Results reviewed and written information provided. Total time in face-to-face counseling 15 minutes.   F/U Clinic Visit in 1 month.    Patient seen with: Jennie Wilson, PharmD Candidate  [employer]  Class of 2022  Luke Van Ausdall, PharmD, CPP Clinical Pharmacist [facility] [phone]      

## 2018-12-01 ENCOUNTER — Other Ambulatory Visit: Payer: Self-pay | Admitting: Nurse Practitioner

## 2018-12-01 ENCOUNTER — Ambulatory Visit: Payer: Self-pay | Attending: Nurse Practitioner | Admitting: Pharmacist

## 2018-12-01 ENCOUNTER — Encounter: Payer: Self-pay | Admitting: Pharmacist

## 2018-12-01 VITALS — BP 167/82 | HR 54

## 2018-12-01 DIAGNOSIS — K219 Gastro-esophageal reflux disease without esophagitis: Secondary | ICD-10-CM

## 2018-12-01 DIAGNOSIS — I10 Essential (primary) hypertension: Secondary | ICD-10-CM

## 2018-12-01 MED FILL — OMEPRAZOLE DR 40 MG CAPSULE: 40 | 30 days supply | Qty: 30 | Fill #0

## 2018-12-01 MED FILL — AMLODIPINE BESYLATE 10 MG T: 10 | 30 days supply | Qty: 30 | Fill #2

## 2018-12-01 MED FILL — LISINOPRIL 40 MG TABLET: 40 | 30 days supply | Qty: 30 | Fill #2

## 2018-12-01 MED FILL — METOPROLOL SUCCINATE ER 25: 25 | 30 days supply | Qty: 30 | Fill #8

## 2018-12-01 MED FILL — ATORVASTATIN 80 MG TABLET: 80 | 30 days supply | Qty: 30 | Fill #1

## 2018-12-01 NOTE — Patient Instructions (Signed)
Thank you for coming to see [place] today.   Blood pressure today is elevated.  Continue taking blood pressure medications as prescribed.   Take lisinopril (1 tablet) and Metoprolol (1 tablet) in the morning.   Take amlodipine (1 tablet) and atorvastatin (1 tablet) in the evening.   Limiting salt and caffeine, as well as exercising as able for at least 30 minutes for 5 days out of the week, can also help you lower your blood pressure.  Take your blood pressure at home if you are able. Please write down these numbers and bring them to your visits.  If you have any questions about medications, please call me [phone].  Franky Macho

## 2018-12-01 NOTE — Progress Notes (Signed)
   S:    PCP: Zelda   Patient arrives in poor spirits. States that he has been under a large amount of stress lately. Presents to the clinic for hypertension management. Patient was referred by Zelda on 10/11/18. I last saw him on 11/02/18 - made no changes to medications as he reported non-adherence.   Today, patient denies adherence with medications. Reports being without x1 week.   He denies chest pain, shortness of breath, HA, or blurred vision.  Current BP Medications include:  Amlodipine 10 mg daily, lisinopril 40 mg daily, Toprol XL 25 mg daily  Dietary habits include: does not limit salt, drinks soda throughout the day Exercise habits include:none outside of work Family / Social history: former smoker, drinks alcohol rarely, uses marijuana occasionally (has not today)  ASCVD risk factors include: NSTEMI, CAD, T2DM, HTN, hyperlipidemia  Home BP readings: doesn't take at home  O:  L arm after 5 minutes rest: 167/82, HR 54  Last 3 Office BP readings: BP Readings from Last 3 Encounters:  12/01/18 (!) 167/82  11/02/18 (!) 160/76  10/11/18 (!) 153/76   BMET    Component Value Date/Time   NA 140 12/28/2017 1203   K 4.0 12/28/2017 1203   CL 103 12/28/2017 1203   CO2 25 12/28/2017 1203   GLUCOSE 86 12/28/2017 1203   GLUCOSE 91 09/28/2016 1538   BUN 10 12/28/2017 1203   CREATININE 0.75 (L) 12/28/2017 1203   CREATININE 1.02 09/28/2016 1538   CALCIUM 9.2 12/28/2017 1203   GFRNONAA 109 12/28/2017 1203   GFRNONAA 88 09/28/2016 1538   GFRAA 126 12/28/2017 1203   GFRAA >89 09/28/2016 1538   Renal function: CrCl cannot be calculated (Patient's most recent lab result is older than the maximum 21 days allowed.).  Clinical ASCVD: Yes  The ASCVD Risk score Denman George DC Jr., et al., 2013) failed to calculate for the following reasons:   The patient has a prior MI or stroke diagnosis  A/P: Hypertension longstanding currently uncontrolled on current medications. BP Goal <130/80 mmHg.  Patient is not adherent with current medications. Adherence encouraged. Pt asked to take amlodipine in the evening and continue Toprol XL and lisinopril in the morning. He is to follow-up with his PCP. Counseled him stating that we cannot make changes if he does not take what he is prescribed. -Continued current regimen.  -Counseled on lifestyle modifications for blood pressure control including reduced dietary sodium, increased exercise, adequate sleep  Results reviewed and written information provided. Total time in face-to-face counseling 15 minutes.   F/U Clinic Visit in 1 month.    Patient seen with: Arletha Pili, PharmD Candidate  [employer]  Class of 2022  Butch Penny, PharmD, CPP Clinical Pharmacist [facility] [phone]

## 2018-12-04 MED FILL — GABAPENTIN 300 MG CAPSULE: 300 | 30 days supply | Qty: 30 | Fill #2

## 2018-12-04 MED FILL — BETAMETHASONE DP 0.05% CRM: 0.05 | 20 days supply | Qty: 45 | Fill #3

## 2018-12-29 ENCOUNTER — Other Ambulatory Visit: Payer: Self-pay | Admitting: Nurse Practitioner

## 2018-12-29 ENCOUNTER — Encounter: Payer: Self-pay | Admitting: Pharmacist

## 2018-12-29 DIAGNOSIS — IMO0001 Reserved for inherently not codable concepts without codable children: Secondary | ICD-10-CM

## 2018-12-29 DIAGNOSIS — E1165 Type 2 diabetes mellitus with hyperglycemia: Principal | ICD-10-CM

## 2018-12-29 NOTE — Progress Notes (Deleted)
   S:    PCP: Zelda   36 AA male w/ HTN, CAD, NSTEMI, T2DM, hyperlipidemia.   Patient arrives in *** spirits. Presents to the clinic for hypertension management. Patient was referred by Zelda on 10/11/18. I last saw him on 12/01/18 - 167/82 at that visit. Pt reported non-compliance with regimen so no changes were made.   Today, patient *** adherence with medications.   He denies chest pain, shortness of breath, HA, or blurred vision.  Current BP Medications include:   - Amlodipine 10 mg daily - Lisinopril 40 mg daily - Toprol XL 25 mg daily  BP medications tried in the past:  - NKDA  - metoprolol tartrate   Dietary habits include: does not limit salt, drinks soda throughout the day Exercise habits include: none outside of work Family / Social history: stroke (mother, father), former smoker, drinks alcohol rarely, uses marijuana occasionally (has not today)  Home BP readings: doesn't take at home  O:  L arm after 5 minutes rest: 167/82, HR 54  Last 3 Office BP readings: BP Readings from Last 3 Encounters:  12/01/18 (!) 167/82  11/02/18 (!) 160/76  10/11/18 (!) 153/76   BMET    Component Value Date/Time   NA 140 12/28/2017 1203   K 4.0 12/28/2017 1203   CL 103 12/28/2017 1203   CO2 25 12/28/2017 1203   GLUCOSE 86 12/28/2017 1203   GLUCOSE 91 09/28/2016 1538   BUN 10 12/28/2017 1203   CREATININE 0.75 (L) 12/28/2017 1203   CREATININE 1.02 09/28/2016 1538   CALCIUM 9.2 12/28/2017 1203   GFRNONAA 109 12/28/2017 1203   GFRNONAA 88 09/28/2016 1538   GFRAA 126 12/28/2017 1203   GFRAA >89 09/28/2016 1538   Renal function: CrCl cannot be calculated (Patient's most recent lab result is older than the maximum 21 days allowed.).  Clinical ASCVD: Yes  The ASCVD Risk score Mikey Bussing DC Jr., et al., 2013) failed to calculate for the following reasons:   The patient has a prior MI or stroke diagnosis  A/P: Hypertension longstanding currently uncontrolled on current medications.  BP Goal <130/80 mmHg. Patient is not adherent with current medications. -*** -CMP+EGFR, Lipid -Counseled on lifestyle modifications for blood pressure control including reduced dietary sodium, increased exercise, adequate sleep  Results reviewed and written information provided. Total time in face-to-face counseling 15 minutes.   F/U Clinic Visit in 1 month.    Patient seen with: Bobbye Riggs, PharmD Candidate  [place] [employer]  Class of 2022  Benard Halsted, PharmD, [place] [phone]

## 2019-01-01 MED FILL — METOPROLOL SUCCINATE ER 25: 25 | 30 days supply | Qty: 30 | Fill #0

## 2019-01-01 MED FILL — metFORMIN HCL 1000 MG TABS: 1000 | 30 days supply | Qty: 60 | Fill #0

## 2019-01-11 ENCOUNTER — Other Ambulatory Visit: Payer: Self-pay | Admitting: Family Medicine

## 2019-01-11 DIAGNOSIS — Z760 Encounter for issue of repeat prescription: Secondary | ICD-10-CM

## 2019-01-17 MED FILL — OMEPRAZOLE DR 40 MG CAPSULE: 40 | 30 days supply | Qty: 30 | Fill #1

## 2019-01-17 MED FILL — LISINOPRIL 40 MG TABLET: 40 | 30 days supply | Qty: 30 | Fill #0

## 2019-01-23 ENCOUNTER — Other Ambulatory Visit: Payer: Self-pay | Admitting: Pharmacist

## 2019-01-23 DIAGNOSIS — L409 Psoriasis, unspecified: Secondary | ICD-10-CM

## 2019-01-23 MED ORDER — BETAMETHASONE DIPROPIONATE 0.05 % EX CREA: TOPICAL_CREAM | Freq: Two times a day (BID) | CUTANEOUS | 0 refills | Status: DC

## 2019-01-23 MED FILL — BETAMETHASONE DP 0.05% CRM: 0.05 | 15 days supply | Qty: 45 | Fill #0

## 2019-01-23 NOTE — Progress Notes (Signed)
Patient requesting refills at [facility] pharmacy. Prescription has refills but quantity is insufficient to provide 45g tube, which is the product that the pharmacy carries. Will send in for #45g.

## 2019-01-24 ENCOUNTER — Other Ambulatory Visit: Payer: Self-pay | Admitting: Family Medicine

## 2019-01-24 ENCOUNTER — Other Ambulatory Visit: Payer: Self-pay | Admitting: Nurse Practitioner

## 2019-01-24 DIAGNOSIS — E1140 Type 2 diabetes mellitus with diabetic neuropathy, unspecified: Secondary | ICD-10-CM

## 2019-01-24 DIAGNOSIS — Z760 Encounter for issue of repeat prescription: Secondary | ICD-10-CM

## 2019-01-24 DIAGNOSIS — IMO0001 Reserved for inherently not codable concepts without codable children: Secondary | ICD-10-CM

## 2019-01-24 DIAGNOSIS — E1165 Type 2 diabetes mellitus with hyperglycemia: Principal | ICD-10-CM

## 2019-01-24 MED FILL — ?AMLODIPINE BESYLATE 10 MG: 10 | 30 days supply | Qty: 30 | Fill #0

## 2019-01-24 MED FILL — ?METOPROLOL SUCC ER 25MG TA: 25 | 30 days supply | Qty: 30 | Fill #1

## 2019-02-01 ENCOUNTER — Other Ambulatory Visit: Payer: Self-pay | Admitting: Nurse Practitioner

## 2019-02-01 DIAGNOSIS — E1165 Type 2 diabetes mellitus with hyperglycemia: Principal | ICD-10-CM

## 2019-02-01 DIAGNOSIS — IMO0001 Reserved for inherently not codable concepts without codable children: Secondary | ICD-10-CM

## 2019-02-01 DIAGNOSIS — E1140 Type 2 diabetes mellitus with diabetic neuropathy, unspecified: Secondary | ICD-10-CM

## 2019-02-01 MED FILL — ?METFORMIN HCL 1000 MG TAB: 1000 | 30 days supply | Qty: 60 | Fill #1

## 2019-02-02 MED FILL — GABAPENTIN 300 MG CAPSULE: 300 | 30 days supply | Qty: 30 | Fill #0

## 2019-02-02 NOTE — Telephone Encounter (Signed)
RX sent on 01/24/19 was not received by the pharmacy, RX was resent.

## 2019-03-06 ENCOUNTER — Other Ambulatory Visit: Payer: Self-pay | Admitting: Family Medicine

## 2019-03-06 DIAGNOSIS — Z760 Encounter for issue of repeat prescription: Secondary | ICD-10-CM

## 2019-03-06 MED FILL — OMEPRAZOLE DR 40 MG CAPSULE: 40 | 30 days supply | Qty: 30 | Fill #2

## 2019-03-14 ENCOUNTER — Other Ambulatory Visit: Payer: Self-pay | Admitting: Family Medicine

## 2019-03-14 DIAGNOSIS — Z760 Encounter for issue of repeat prescription: Secondary | ICD-10-CM

## 2019-04-03 ENCOUNTER — Other Ambulatory Visit: Payer: Self-pay | Admitting: Family Medicine

## 2019-04-03 DIAGNOSIS — Z760 Encounter for issue of repeat prescription: Secondary | ICD-10-CM

## 2019-04-03 MED FILL — metFORMIN HCL 1000 MG TABS: 1000 | 30 days supply | Qty: 60 | Fill #2

## 2019-04-03 MED FILL — GABAPENTIN 300 MG CAPSULE: 300 | 30 days supply | Qty: 30 | Fill #1

## 2019-04-03 MED FILL — ?AMLODIPINE BESYLATE 10 MG: 10 | 30 days supply | Qty: 30 | Fill #1

## 2019-04-03 MED FILL — ATORVASTATIN 80 MG TABLET: 80 | 30 days supply | Qty: 30 | Fill #2

## 2019-04-04 ENCOUNTER — Other Ambulatory Visit: Payer: Self-pay

## 2019-04-04 ENCOUNTER — Ambulatory Visit: Payer: Self-pay | Attending: Nurse Practitioner | Admitting: Nurse Practitioner

## 2019-04-04 ENCOUNTER — Encounter: Payer: Self-pay | Admitting: Nurse Practitioner

## 2019-04-04 DIAGNOSIS — E1165 Type 2 diabetes mellitus with hyperglycemia: Secondary | ICD-10-CM

## 2019-04-04 DIAGNOSIS — IMO0001 Reserved for inherently not codable concepts without codable children: Secondary | ICD-10-CM

## 2019-04-04 DIAGNOSIS — I10 Essential (primary) hypertension: Secondary | ICD-10-CM

## 2019-04-04 MED ORDER — HYDROCHLOROTHIAZIDE 25 MG PO TABS: 25.0000 mg | ORAL_TABLET | Freq: Every day | ORAL | 3 refills | Status: DC

## 2019-04-04 MED ORDER — AMLODIPINE BESYLATE 10 MG PO TABS: 10.0000 mg | ORAL_TABLET | Freq: Every day | ORAL | 2 refills | Status: DC

## 2019-04-04 MED ORDER — ATORVASTATIN CALCIUM 80 MG PO TABS: 80.0000 mg | ORAL_TABLET | Freq: Every day | ORAL | 2 refills | Status: DC

## 2019-04-04 MED ORDER — ASPIRIN 81 MG PO TBEC: 81.0000 mg | DELAYED_RELEASE_TABLET | Freq: Every day | ORAL | 12 refills | Status: DC

## 2019-04-04 MED ORDER — LISINOPRIL 40 MG PO TABS: 40.0000 mg | ORAL_TABLET | Freq: Every day | ORAL | 0 refills | Status: DC

## 2019-04-04 MED FILL — ?HYDROCHLOROTHIAZIDE 25MG T: 25 | 90 days supply | Qty: 90 | Fill #0

## 2019-04-04 MED FILL — ?ATORVASTATIN 40MG TABLET: 40 | 30 days supply | Qty: 60 | Fill #0

## 2019-04-04 MED FILL — LISINOPRIL 40 MG TABLET: 40 | 30 days supply | Qty: 30 | Fill #0

## 2019-04-04 NOTE — Progress Notes (Signed)
Virtual Visit via Telephone Note Due to national recommendations of social distancing due to [place] 19, telehealth visit is felt to be most appropriate for this patient at this time.  I discussed the limitations, risks, security and privacy concerns of performing an evaluation and management service by telephone and the availability of in person appointments. I also discussed with the patient that there may be a patient responsible charge related to this service. The patient expressed understanding and agreed to proceed.    I connected with Ree Edman. on 04/04/19  at   3:50 PM EDT  EDT by telephone and verified that I am speaking with the correct person using two identifiers.   Consent I discussed the limitations, risks, security and privacy concerns of performing an evaluation and management service by telephone and the availability of in person appointments. I also discussed with the patient that there may be a patient responsible charge related to this service. The patient expressed understanding and agreed to proceed.   Location of Patient: Private Residence    Location of Provider: [place] and [employer] participating in Telemedicine visit: Geryl Rankins FNP-BC [place].    History of Present Illness: Telemedicine visit for: F/U HTN  has a past medical history of CAD (coronary artery disease), native coronary artery (12/21/12), Diabetes mellitus without complication ([place]), GERD (gastroesophageal reflux disease), Hyperlipidemia, Hypertension, NSTEMI, Psoriasis, and Tobacco abuse.   Essential Hypertension Chronic and not well controlled.  He took his blood pressure at home today during the televisit. BP 186/103 HR 69. He is asymptomatic at this time. States he may have skipped a few doses. Will add HCTZ 25mg  to current meds: amlodipine 10 mg, lisinopril 40 mg and metoprolol xl 25 mg daily. Endorses intermittent  episodes of left chest discomfort which goes away on its own. Will obtain EKG at upcoming appointment for BP recheck. Denies shortness of breath, palpitations, lightheadedness, dizziness, headaches or BLE edema.  BP Readings from Last 3 Encounters:  12/01/18 (!) 167/82  11/02/18 (!) 160/76  10/11/18 (!) 153/76       Past Medical History:  Diagnosis Date  . CAD (coronary artery disease), native coronary artery 12/21/12   a. NSTEMI 2/14 => LHC 12/21/12: mLAD 90%, OM1 20%, mRCA 100%, EF 55-60%.  PCI: Xience Xpedition DES to mRCA and Xience Xpedition DES to mLAD.   . Diabetes mellitus without complication ([place])   . GERD (gastroesophageal reflux disease)   . Hyperlipidemia   . Hypertension   . NSTEMI   . Psoriasis   . Tobacco abuse     Past Surgical History:  Procedure Laterality Date  . CORONARY ANGIOPLASTY WITH STENT PLACEMENT  12/21/12   90% mid LAD s/p DES, 20% OM1, 100% mid RCA s/p DES; LVEF 55-60%  . LEFT HEART CATHETERIZATION WITH CORONARY ANGIOGRAM N/A 12/21/2012   Procedure: LEFT HEART CATHETERIZATION WITH CORONARY ANGIOGRAM;  Surgeon: Burnell Blanks, MD;  Location: [facility] CATH LAB;  Service: Cardiovascular;  Laterality: N/A;    Family History  Problem Relation Age of Onset  . Heart attack Father   . Heart attack Mother     Social History   Socioeconomic History  . Marital status: Single    Spouse name: Not on file  . Number of children: Not on file  . Years of education: Not on file  . Highest education level: Not on file  Occupational History  . Not on file  Social  Needs  . Financial resource strain: Not on file  . Food insecurity:    Worry: Not on file    Inability: Not on file  . Transportation needs:    Medical: Not on file    Non-medical: Not on file  Tobacco Use  . Smoking status: Former Smoker    Packs/day: 0.25    Years: 20.00    Pack years: 5.00    Types: Cigarettes    Last attempt to quit: 02/20/2016    Years since quitting: 3.1  . Smokeless  tobacco: Never Used  . Tobacco comment: Smoke [place] OCC.   Substance and Sexual Activity  . Alcohol use: No    Alcohol/week: 1.0 standard drinks    Types: 1 Standard drinks or equivalent per week    Frequency: Never  . Drug use: Yes    Types: Marijuana  . Sexual activity: Not on file  Lifestyle  . Physical activity:    Days per week: Not on file    Minutes per session: Not on file  . Stress: Not on file  Relationships  . Social connections:    Talks on phone: Not on file    Gets together: Not on file    Attends religious service: Not on file    Active member of club or organization: Not on file    Attends meetings of clubs or organizations: Not on file    Relationship status: Not on file  Other Topics Concern  . Not on file  Social History Narrative  . Not on file     Observations/Objective: Awake, alert and oriented x 3   ROS  Assessment and Plan: Britt Bottomlvin was seen today for medication refill.  Diagnoses and all orders for this visit:  Essential hypertension -     lisinopril (ZESTRIL) 40 MG tablet; Take 1 tablet (40 mg total) by mouth daily. Make appt with Quincey Quesinberry for more refills. -     amLODipine (NORVASC) 10 MG tablet; Take 1 tablet (10 mg total) by mouth daily for 30 days. -     aspirin 81 MG EC tablet; Take 1 tablet (81 mg total) by mouth daily. -     hydrochlorothiazide (HYDRODIURIL) 25 MG tablet; Take 1 tablet (25 mg total) by mouth daily. -     CBC -     Basic metabolic panel Continue all antihypertensives as prescribed.  Remember to bring in your blood pressure log with you for your follow up appointment.  DASH/Mediterranean Diets are healthier choices for HTN.   Uncontrolled type 2 diabetes mellitus without complication, without long-term current use of insulin (HCC) -     atorvastatin (LIPITOR) 80 MG tablet; Take 1 tablet (80 mg total) by mouth daily. -     Hemoglobin A1c -     Lipid panel Diabetes is well controlled. Advised patient to keep a fasting  blood sugar log fast, 2 hours post lunch and bedtime which will be reviewed at the next office visit. LDL at goal.  INSTRUCTIONS: Work on a low fat, heart healthy diet and participate in regular aerobic exercise program by working out at least 150 minutes per week; 5 days a week-30 minutes per day. Avoid red meat, fried foods. junk foods, sodas, sugary drinks, unhealthy snacking, alcohol and smoking.  Drink at least 48oz of water per day and monitor your carbohydrate intake daily.  Lab Results  Component Value Date   HGBA1C 6.9 10/11/2018   Lab Results  Component Value Date   LDLCALC  31 05/06/2017      Follow Up Instructions Return in about 3 weeks (around 04/25/2019) for BP recheck, Fasting labs.     I discussed the assessment and treatment plan with the patient. The patient was provided an opportunity to ask questions and all were answered. The patient agreed with the plan and demonstrated an understanding of the instructions.   The patient was advised to call back or seek an in-person evaluation if the symptoms worsen or if the condition fails to improve as anticipated.  I provided 24 minutes of non-face-to-face time during this encounter including median intraservice time, reviewing previous notes, labs, imaging, medications and explaining diagnosis and management.  Claiborne RiggZelda W Alrick Cubbage, FNP-BC

## 2019-04-25 ENCOUNTER — Encounter: Payer: Self-pay | Admitting: Nurse Practitioner

## 2019-04-25 ENCOUNTER — Other Ambulatory Visit: Payer: Self-pay

## 2019-04-25 ENCOUNTER — Ambulatory Visit: Payer: Self-pay | Attending: Nurse Practitioner | Admitting: Nurse Practitioner

## 2019-04-25 ENCOUNTER — Other Ambulatory Visit: Payer: Self-pay | Admitting: Nurse Practitioner

## 2019-04-25 VITALS — BP 131/71 | HR 70 | Temp 99.0°F | Ht 68.0 in | Wt 171.0 lb

## 2019-04-25 DIAGNOSIS — L409 Psoriasis, unspecified: Secondary | ICD-10-CM

## 2019-04-25 DIAGNOSIS — I25118 Atherosclerotic heart disease of native coronary artery with other forms of angina pectoris: Secondary | ICD-10-CM

## 2019-04-25 DIAGNOSIS — IMO0001 Reserved for inherently not codable concepts without codable children: Secondary | ICD-10-CM

## 2019-04-25 DIAGNOSIS — I10 Essential (primary) hypertension: Secondary | ICD-10-CM

## 2019-04-25 DIAGNOSIS — K219 Gastro-esophageal reflux disease without esophagitis: Secondary | ICD-10-CM

## 2019-04-25 DIAGNOSIS — E1165 Type 2 diabetes mellitus with hyperglycemia: Secondary | ICD-10-CM

## 2019-04-25 DIAGNOSIS — E1140 Type 2 diabetes mellitus with diabetic neuropathy, unspecified: Secondary | ICD-10-CM

## 2019-04-25 LAB — POCT GLYCOSYLATED HEMOGLOBIN (HGB A1C): Hemoglobin A1C: 6.8 % — AB (ref 4.0–5.6)

## 2019-04-25 LAB — GLUCOSE, POCT (MANUAL RESULT ENTRY): POC Glucose: 102 mg/dl — AB (ref 70–99)

## 2019-04-25 MED ORDER — LISINOPRIL 40 MG PO TABS: 40.0000 mg | ORAL_TABLET | Freq: Every day | ORAL | 0 refills | Status: DC

## 2019-04-25 MED ORDER — AMLODIPINE BESYLATE 10 MG PO TABS: 10.0000 mg | ORAL_TABLET | Freq: Every day | ORAL | 0 refills | Status: DC

## 2019-04-25 MED ORDER — ASPIRIN 81 MG PO TBEC: 81.0000 mg | DELAYED_RELEASE_TABLET | Freq: Every day | ORAL | 0 refills | Status: AC

## 2019-04-25 MED ORDER — METOPROLOL SUCCINATE ER 25 MG PO TB24: 25.0000 mg | ORAL_TABLET | Freq: Every day | ORAL | 2 refills | Status: DC

## 2019-04-25 MED ORDER — HYDROCHLOROTHIAZIDE 25 MG PO TABS: 25.0000 mg | ORAL_TABLET | Freq: Every day | ORAL | 3 refills | Status: DC

## 2019-04-25 MED ORDER — GABAPENTIN 300 MG PO CAPS: 300.0000 mg | ORAL_CAPSULE | Freq: Every day | ORAL | 0 refills | Status: DC

## 2019-04-25 MED ORDER — METFORMIN HCL 1000 MG PO TABS: 1000.0000 mg | ORAL_TABLET | Freq: Two times a day (BID) | ORAL | 1 refills | Status: DC

## 2019-04-25 MED ORDER — BETAMETHASONE DIPROPIONATE 0.05 % EX CREA: TOPICAL_CREAM | Freq: Two times a day (BID) | CUTANEOUS | 1 refills | Status: DC

## 2019-04-25 MED ORDER — OMEPRAZOLE 40 MG PO CPDR: 40.0000 mg | DELAYED_RELEASE_CAPSULE | Freq: Every day | ORAL | 2 refills | Status: DC

## 2019-04-25 MED ORDER — GLUCOSE BLOOD VI STRP: ORAL_STRIP | 12 refills | Status: DC

## 2019-04-25 MED ORDER — TRUE METRIX METER W/DEVICE KIT: PACK | 0 refills | Status: AC

## 2019-04-25 MED ORDER — ATORVASTATIN CALCIUM 80 MG PO TABS: 80.0000 mg | ORAL_TABLET | Freq: Every day | ORAL | 0 refills | Status: DC

## 2019-04-25 MED FILL — TRUE METRIX TEST STRIP: 30 days supply | Qty: 100 | Fill #0

## 2019-04-25 MED FILL — !TRUE METRIX BLOOD GLUCOSE: 30 days supply | Qty: 1 | Fill #0

## 2019-04-25 MED FILL — ATORVASTATIN 80 MG TABLET: 80 | 30 days supply | Qty: 30 | Fill #0

## 2019-04-25 MED FILL — ?AMLODIPINE BESYLATE 10 MG: 10 | 30 days supply | Qty: 30 | Fill #0

## 2019-04-25 MED FILL — BETAMETHASONE DP 0.05% CRM: 0.05 | 14 days supply | Qty: 45 | Fill #0

## 2019-04-25 MED FILL — ?METOPROLOL SUCC ER 25MG TA: 25 | 30 days supply | Qty: 30 | Fill #0

## 2019-04-25 MED FILL — OMEPRAZOLE DR 40 MG CAPSULE: 40 | 30 days supply | Qty: 30 | Fill #0

## 2019-04-25 NOTE — Progress Notes (Signed)
Assessment & Plan:  Bradley Wolfe was seen today for follow-up.  Diagnoses and all orders for this visit:  Essential hypertension -     aspirin 81 MG EC tablet; Take 1 tablet (81 mg total) by mouth daily. -     hydrochlorothiazide (HYDRODIURIL) 25 MG tablet; Take 1 tablet (25 mg total) by mouth daily. -     lisinopril (ZESTRIL) 40 MG tablet; Take 1 tablet (40 mg total) by mouth daily. Make appt with Jaylynn Mcaleer for more refills. -     metoprolol succinate (TOPROL-XL) 25 MG 24 hr tablet; Take 1 tablet (25 mg total) by mouth daily. -     amLODipine (NORVASC) 10 MG tablet; Take 1 tablet (10 mg total) by mouth daily. Continue all antihypertensives as prescribed.  Remember to bring in your blood pressure log with you for your follow up appointment.  DASH/Mediterranean Diets are healthier choices for HTN.   Uncontrolled type 2 diabetes mellitus without complication, without long-term current use of insulin (HCC) -     Glucose (CBG) -     HgB A1c -     Basic metabolic panel -     CBC -     Lipid panel -     metFORMIN (GLUCOPHAGE) 1000 MG tablet; Take 1 tablet (1,000 mg total) by mouth 2 (two) times daily with a meal. -     atorvastatin (LIPITOR) 80 MG tablet; Take 1 tablet (80 mg total) by mouth daily. -     gabapentin (NEURONTIN) 300 MG capsule; Take 1 capsule (300 mg total) by mouth at bedtime. -     glucose blood test strip; Use as instructed -     Blood Glucose Monitoring Suppl (TRUE METRIX METER) w/Device KIT; Needs new meter. We tested the his current one with a new battery and it is defective. Patient will pick up scripts today. -     Ambulatory referral to Ophthalmology Continue blood sugar control as discussed in office today, low carbohydrate diet, and regular physical exercise as tolerated, 150 minutes per week (30 min each day, 5 days per week, or 50 min 3 days per week). Keep blood sugar logs with fasting goal of 90-130 mg/dl, post prandial (after you eat) less than 180.  For Hypoglycemia: BS  <60 and Hyperglycemia BS >400; contact the clinic ASAP. Annual eye exams and foot exams are recommended.   Gastroesophageal reflux disease, esophagitis presence not specified -     omeprazole (PRILOSEC) 40 MG capsule; Take 1 capsule (40 mg total) by mouth daily. Will pick up today. INSTRUCTIONS: Avoid GERD Triggers: acidic, spicy or fried foods, caffeine, coffee, sodas,  alcohol and chocolate.   Diabetic neuropathy, painful (HCC) -     gabapentin (NEURONTIN) 300 MG capsule; Take 1 capsule (300 mg total) by mouth at bedtime.  Psoriasis -     betamethasone dipropionate (DIPROLENE) 0.05 % cream; Apply topically 2 (two) times daily. Patient will pick up scripts today.  Coronary artery disease involving native coronary artery of native heart with other form of angina pectoris [facility]) -     Ambulatory referral to Cardiology    Patient has been counseled on age-appropriate routine health concerns for screening and prevention. These are reviewed and up-to-date. Referrals have been placed accordingly. Immunizations are up-to-date or declined.    Subjective:   Chief Complaint  Patient presents with  . Follow-up    Pt. is here to follow up on Diabetes and medication.    HPI [place]  y.o. male presents to office today for follow up.  has a past medical history of CAD (coronary artery disease), native coronary artery (12/21/12), Diabetes mellitus without complication ([place]), GERD (gastroesophageal reflux disease), Hyperlipidemia, Hypertension, NSTEMI, Psoriasis, and Tobacco abuse.  He has a history of CAD (NSTEMI 11/2012 s/p DES to [facility] and DES to mLAD, EF 55-60%) and has not seen Cardiology in several years (2017).   Essential Hypertension Blood pressure is well controlled today. Taking amlodipine 10 mg , HCTZ 25 mg, lisinopril 40 mg and Toprol XL daily as prescribed. He does endorse intermittent dizziness with positioning when he is out working in the sun. Denies any syncope.   BP Readings from Last 3 Encounters:  04/25/19 131/71  12/01/18 (!) 167/82  11/02/18 (!) 160/76    DM TYPE 2 Chronic and well controlled. Continue on metformin 1000 mg BID. Denies any hypoglycemic symptoms. Overdue for eye exam. Patient has been advised to apply for financial assistance and schedule to see our financial counselor. Takes gabapentin for neuropathy.  Lab Results  Component Value Date   HGBA1C 6.8 (A) 04/25/2019   Review of Systems  Constitutional: Positive for weight loss. Negative for fever and malaise/fatigue.  HENT: Negative.  Negative for nosebleeds.   Eyes: Negative.  Negative for blurred vision, double vision and photophobia.  Respiratory: Negative.  Negative for cough and shortness of breath.   Cardiovascular: Negative.  Negative for chest pain, palpitations and leg swelling.  Gastrointestinal: Positive for heartburn. Negative for nausea and vomiting.  Musculoskeletal: Negative.  Negative for myalgias.  Skin: Positive for itching and rash (psoriasis).  Neurological: Negative.  Negative for dizziness, focal weakness, seizures and headaches.  Psychiatric/Behavioral: Negative.  Negative for suicidal ideas.    Past Medical History:  Diagnosis Date  . CAD (coronary artery disease), native coronary artery 12/21/12   a. NSTEMI 2/14 => LHC 12/21/12: mLAD 90%, OM1 20%, mRCA 100%, EF 55-60%.  PCI: Xience Xpedition DES to mRCA and Xience Xpedition DES to mLAD.   . Diabetes mellitus without complication ([place])   . GERD (gastroesophageal reflux disease)   . Hyperlipidemia   . Hypertension   . NSTEMI   . Psoriasis   . Tobacco abuse     Past Surgical History:  Procedure Laterality Date  . CORONARY ANGIOPLASTY WITH STENT PLACEMENT  12/21/12   90% mid LAD s/p DES, 20% OM1, 100% mid RCA s/p DES; LVEF 55-60%  . LEFT HEART CATHETERIZATION WITH CORONARY ANGIOGRAM N/A 12/21/2012   Procedure: LEFT HEART CATHETERIZATION WITH CORONARY ANGIOGRAM;  Surgeon: Burnell Blanks,  MD;  Location: [facility] CATH LAB;  Service: Cardiovascular;  Laterality: N/A;    Family History  Problem Relation Age of Onset  . Heart attack Father   . Heart attack Mother     Social History Reviewed with no changes to be made today.   Outpatient Medications Prior to Visit  Medication Sig Dispense Refill  . acetaminophen (TYLENOL) 500 MG tablet Take 2 tablets (1,000 mg total) by mouth every 8 (eight) hours as needed for moderate pain. 30 tablet 0  . albuterol (PROVENTIL HFA;VENTOLIN HFA) 108 (90 Base) MCG/ACT inhaler Inhale 2 puffs into the lungs every 6 (six) hours as needed for wheezing or shortness of breath. 1 Inhaler 2  . nitroGLYCERIN (NITROSTAT) 0.4 MG SL tablet DISSOLVE ONE TABLET UNDER THE TONGUE EVERY 5 MINUTES AS NEEDED FOR CHEST PAIN.  DO NOT EXCEED A TOTAL OF 3 DOSES IN 15 MINUTES 25 tablet 1  . TRUEPLUS LANCETS 26G  MISC 1 each by Does not apply route every 8 (eight) hours as needed. 100 each 12  . amLODipine (NORVASC) 10 MG tablet Take 1 tablet (10 mg total) by mouth daily for 30 days. 30 tablet 2  . aspirin 81 MG EC tablet Take 1 tablet (81 mg total) by mouth daily. 30 tablet 12  . atorvastatin (LIPITOR) 80 MG tablet Take 1 tablet (80 mg total) by mouth daily. 30 tablet 2  . betamethasone dipropionate (DIPROLENE) 0.05 % cream Apply topically 2 (two) times daily. 45 g 0  . Blood Glucose Monitoring Suppl (TRUE METRIX GO GLUCOSE METER) w/Device KIT 1 each by Does not apply route every 8 (eight) hours as needed. 1 kit 0  . gabapentin (NEURONTIN) 300 MG capsule TAKE 1 CAPSULE (300 MG TOTAL) BY MOUTH AT BEDTIME. 90 capsule 0  . glucose blood test strip Use as instructed 100 each 12  . hydrochlorothiazide (HYDRODIURIL) 25 MG tablet Take 1 tablet (25 mg total) by mouth daily. 90 tablet 3  . lisinopril (ZESTRIL) 40 MG tablet Take 1 tablet (40 mg total) by mouth daily. Make appt with Jayson Waterhouse for more refills. 90 tablet 0  . metFORMIN (GLUCOPHAGE) 1000 MG tablet TAKE 1 TABLET BY MOUTH 2 TIMES  DAILY WITH A MEAL. 60 tablet 2  . metoprolol succinate (TOPROL-XL) 25 MG 24 hr tablet TAKE 1 TABLET BY MOUTH DAILY. 30 tablet 2  . omeprazole (PRILOSEC) 40 MG capsule TAKE 1 CAPSULE BY MOUTH DAILY. 30 capsule 2  . Coal Tar Extract (PSORIASIN EX) Apply 1 application topically as needed (for psoriasis).    . cyclobenzaprine (FLEXERIL) 10 MG tablet Take 1 tablet (10 mg total) by mouth 3 (three) times daily as needed for muscle spasms. (Patient not taking: Reported on 04/04/2019) 60 tablet 0   No facility-administered medications prior to visit.     No Known Allergies     Objective:    BP 131/71 (BP Location: Right Arm, Patient Position: Sitting, Cuff Size: Normal)   Pulse 70   Temp 99 F (37.2 C) (Oral)   Ht _0  (1.727 m)   Wt 171 lb (77.6 kg)   SpO2 99%   BMI 26.00 kg/m  Wt Readings from Last 3 Encounters:  04/25/19 171 lb (77.6 kg)  10/11/18 184 lb 3.2 oz (83.6 kg)  07/19/18 176 lb (79.8 kg)    Physical Exam Vitals signs and nursing note reviewed.  Constitutional:      Appearance: He is well-developed.  HENT:     Head: Normocephalic and atraumatic.  Neck:     Musculoskeletal: Normal range of motion.  Cardiovascular:     Rate and Rhythm: Normal rate and regular rhythm.     Heart sounds: Normal heart sounds. No murmur. No friction rub. No gallop.   Pulmonary:     Effort: Pulmonary effort is normal. No tachypnea or respiratory distress.     Breath sounds: Normal breath sounds. No decreased breath sounds, wheezing, rhonchi or rales.  Chest:     Chest wall: No tenderness.  Abdominal:     General: Bowel sounds are normal.     Palpations: Abdomen is soft.  Musculoskeletal: Normal range of motion.  Skin:    General: Skin is warm and dry.     Comments: Moderate plaque psoriasis visible on bilateral lower and upper extremities.   Neurological:     Mental Status: He is alert and oriented to person, place, and time.     Coordination: Coordination normal.  Psychiatric:  Behavior: Behavior normal. Behavior is cooperative.        Thought Content: Thought content normal.        Judgment: Judgment normal.        Patient has been counseled extensively about nutrition and exercise as well as the importance of adherence with medications and regular follow-up. The patient was given clear instructions to go to ER or return to medical center if symptoms don't improve, worsen or new problems develop. The patient verbalized understanding.   Follow-up: Return in about 3 months (around 07/26/2019) for NEEDS FA FORMS TODAY.   Gildardo Pounds, FNP-BC [facility] [place], [place]   04/27/2019, 8:55 PM

## 2019-04-25 NOTE — Patient Instructions (Signed)
Dizziness Dizziness is a common problem. It is a feeling of unsteadiness or light-headedness. You may feel like you are about to faint. Dizziness can lead to injury if you stumble or fall. Anyone can become dizzy, but dizziness is more common in older adults. This condition can be caused by a number of things, including medicines, dehydration, or illness. Follow these instructions at home: Eating and drinking  Drink enough fluid to keep your urine clear or pale yellow. This helps to keep you from becoming dehydrated. Try to drink more clear fluids, such as water.  Do not drink alcohol.  Limit your caffeine intake if told to do so by your health care provider. Check ingredients and nutrition facts to see if a food or beverage contains caffeine.  Limit your salt (sodium) intake if told to do so by your health care provider. Check ingredients and nutrition facts to see if a food or beverage contains sodium. Activity  Avoid making quick movements. ? Rise slowly from chairs and steady yourself until you feel okay. ? In the morning, first sit up on the side of the bed. When you feel okay, stand slowly while you hold onto something until you know that your balance is fine.  If you need to stand in one place for a long time, move your legs often. Tighten and relax the muscles in your legs while you are standing.  Do not drive or use heavy machinery if you feel dizzy.  Avoid bending down if you feel dizzy. Place items in your home so that they are easy for you to reach without leaning over. Lifestyle  Do not use any products that contain nicotine or tobacco, such as cigarettes and e-cigarettes. If you need help quitting, ask your health care provider.  Try to reduce your stress level by using methods such as yoga or meditation. Talk with your health care provider if you need help to manage your stress. General instructions  Watch your dizziness for any changes.  Take over-the-counter and  prescription medicines only as told by your health care provider. Talk with your health care provider if you think that your dizziness is caused by a medicine that you are taking.  Tell a friend or a family member that you are feeling dizzy. If he or she notices any changes in your behavior, have this person call your health care provider.  Keep all follow-up visits as told by your health care provider. This is important. Contact a health care provider if:  Your dizziness does not go away.  Your dizziness or light-headedness gets worse.  You feel nauseous.  You have reduced hearing.  You have new symptoms.  You are unsteady on your feet or you feel like the room is spinning. Get help right away if:  You vomit or have diarrhea and are unable to eat or drink anything.  You have problems talking, walking, swallowing, or using your arms, hands, or legs.  You feel generally weak.  You are not thinking clearly or you have trouble forming sentences. It may take a friend or family member to notice this.  You have chest pain, abdominal pain, shortness of breath, or sweating.  Your vision changes.  You have any bleeding.  You have a severe headache.  You have neck pain or a stiff neck.  You have a fever. These symptoms may represent a serious problem that is an emergency. Do not wait to see if the symptoms will go away. Get medical help   right away. Call your local emergency services (911 in the [place] Do not drive yourself to the hospital. Summary  Dizziness is a feeling of unsteadiness or light-headedness. This condition can be caused by a number of things, including medicines, dehydration, or illness.  Anyone can become dizzy, but dizziness is more common in older adults.  Drink enough fluid to keep your urine clear or pale yellow. Do not drink alcohol.  Avoid making quick movements if you feel dizzy. Monitor your dizziness for any changes. This information is not intended to  replace advice given to you by your health care provider. Make sure you discuss any questions you have with your health care provider. Document Released: 04/06/2001 Document Revised: 10/14/2017 Document Reviewed: 11/13/2016 Elsevier Patient Education  2020 [employer]  

## 2019-04-26 LAB — LIPID PANEL
Chol/HDL Ratio: 2 ratio (ref 0.0–5.0)
Cholesterol, Total: 98 mg/dL — ABNORMAL LOW (ref 100–199)
HDL: 49 mg/dL (ref >39)
LDL Calculated: 30 mg/dL (ref 0–99)
Triglycerides: 93 mg/dL (ref 0–149)
VLDL Cholesterol Cal: 19 mg/dL (ref 5–40)

## 2019-04-26 LAB — BASIC METABOLIC PANEL
BUN/Creatinine Ratio: 14 (ref 9–20)
BUN: 14 mg/dL (ref 6–24)
CO2: 27 mmol/L (ref 20–29)
Calcium: 9.7 mg/dL (ref 8.7–10.2)
Chloride: 100 mmol/L (ref 96–106)
Creatinine, Ser: 1.01 mg/dL (ref 0.76–1.27)
GFR calc Af Amer: 101 mL/min/{1.73_m2} (ref >59)
GFR calc non Af Amer: 88 mL/min/{1.73_m2} (ref >59)
Glucose: 86 mg/dL (ref 65–99)
Potassium: 3.7 mmol/L (ref 3.5–5.2)
Sodium: 141 mmol/L (ref 134–144)

## 2019-04-26 LAB — CBC
Hematocrit: 42.2 % (ref 37.5–51.0)
Hemoglobin: 14.2 g/dL (ref 13.0–17.7)
MCH: 29.3 pg (ref 26.6–33.0)
MCHC: 33.6 g/dL (ref 31.5–35.7)
MCV: 87 fL (ref 79–97)
Platelets: 240 10*3/uL (ref 150–450)
RBC: 4.85 x10E6/uL (ref 4.14–5.80)
RDW: 12.3 % (ref 11.6–15.4)
WBC: 5 10*3/uL (ref 3.4–10.8)

## 2019-04-27 ENCOUNTER — Encounter: Payer: Self-pay | Admitting: Nurse Practitioner

## 2019-04-27 DIAGNOSIS — I2510 Atherosclerotic heart disease of native coronary artery without angina pectoris: Secondary | ICD-10-CM | POA: Insufficient documentation

## 2019-05-07 ENCOUNTER — Telehealth: Payer: Self-pay | Admitting: Nurse Practitioner

## 2019-05-07 MED FILL — LISINOPRIL 40 MG TABLET: 40 | 30 days supply | Qty: 30 | Fill #0

## 2019-05-07 MED FILL — metFORMIN HCL 1000 MG TABS: 1000 | 30 days supply | Qty: 60 | Fill #0

## 2019-05-07 MED FILL — BETAMETHASONE DP 0.05% CRM: 0.05 | 14 days supply | Qty: 45 | Fill #0

## 2019-05-07 NOTE — Telephone Encounter (Signed)
Pt request ret call to advise if he should continue amlodipine and to confirm all meds on list.

## 2019-05-10 NOTE — Telephone Encounter (Signed)
Bien please verify patient's medications based on his last office visit. He should still be taking amlodipine.

## 2019-05-11 NOTE — Telephone Encounter (Signed)
CMA and PCP spoke to patient to verified he is supposed to take Atorvastatin 80mg , and his old bottle was Atorvastatin 40mg 2x a day.  Patient was questioning of Rx number changes and his concerns was addressed by PCP.

## 2019-05-18 MED FILL — GABAPENTIN 300 MG CAPSULE: 300 | 30 days supply | Qty: 30 | Fill #2

## 2019-05-31 MED FILL — OMEPRAZOLE DR 40 MG CAPSULE: 40 | 30 days supply | Qty: 30 | Fill #1

## 2019-05-31 MED FILL — GABAPENTIN 300 MG CAPSULE: 300 | 30 days supply | Qty: 30 | Fill #2

## 2019-06-15 MED FILL — LISINOPRIL 40 MG TABLET: 40 | 30 days supply | Qty: 30 | Fill #1

## 2019-06-22 MED FILL — ?METOPROLOL SUCC ER 25MG TA: 25 | 30 days supply | Qty: 30 | Fill #1

## 2019-06-22 MED FILL — metFORMIN HCL 1000 MG TABS: 1000 | 30 days supply | Qty: 60 | Fill #1

## 2019-07-03 MED FILL — LISINOPRIL 40 MG TABLET: 40 | 30 days supply | Qty: 30 | Fill #1

## 2019-07-10 MED FILL — ATORVASTATIN 80 MG TABLET: 80 | 30 days supply | Qty: 30 | Fill #1

## 2019-07-10 MED FILL — ?AMLODIPINE BESYLATE 10 MG: 10 | 30 days supply | Qty: 30 | Fill #1

## 2019-07-26 MED FILL — ATORVASTATIN 80 MG TABLET: 80 | 30 days supply | Qty: 30 | Fill #1

## 2019-07-26 MED FILL — ?AMLODIPINE BESYLATE 10 MG: 10 | 30 days supply | Qty: 30 | Fill #1

## 2019-07-26 MED FILL — OMEPRAZOLE DR 40 MG CAPSULE: 40 | 30 days supply | Qty: 30 | Fill #2

## 2019-07-30 ENCOUNTER — Encounter: Payer: Self-pay | Admitting: Nurse Practitioner

## 2019-07-30 ENCOUNTER — Ambulatory Visit: Payer: Self-pay | Attending: Nurse Practitioner | Admitting: Nurse Practitioner

## 2019-07-30 ENCOUNTER — Other Ambulatory Visit: Payer: Self-pay

## 2019-07-30 DIAGNOSIS — E1165 Type 2 diabetes mellitus with hyperglycemia: Secondary | ICD-10-CM

## 2019-07-30 DIAGNOSIS — IMO0002 Reserved for concepts with insufficient information to code with codable children: Secondary | ICD-10-CM

## 2019-07-30 DIAGNOSIS — I10 Essential (primary) hypertension: Secondary | ICD-10-CM

## 2019-07-30 DIAGNOSIS — E785 Hyperlipidemia, unspecified: Secondary | ICD-10-CM

## 2019-07-30 DIAGNOSIS — J410 Simple chronic bronchitis: Secondary | ICD-10-CM

## 2019-07-30 DIAGNOSIS — K219 Gastro-esophageal reflux disease without esophagitis: Secondary | ICD-10-CM

## 2019-07-30 DIAGNOSIS — E118 Type 2 diabetes mellitus with unspecified complications: Secondary | ICD-10-CM

## 2019-07-30 DIAGNOSIS — B070 Plantar wart: Secondary | ICD-10-CM

## 2019-07-30 MED ORDER — METFORMIN HCL 1000 MG PO TABS: 1000.0000 mg | ORAL_TABLET | Freq: Two times a day (BID) | ORAL | 1 refills | Status: DC

## 2019-07-30 MED ORDER — OMEPRAZOLE 40 MG PO CPDR: 40.0000 mg | DELAYED_RELEASE_CAPSULE | Freq: Every day | ORAL | 2 refills | Status: DC

## 2019-07-30 MED ORDER — ATORVASTATIN CALCIUM 80 MG PO TABS: 80.0000 mg | ORAL_TABLET | Freq: Every day | ORAL | 0 refills | Status: DC

## 2019-07-30 MED ORDER — METOPROLOL SUCCINATE ER 25 MG PO TB24: 25.0000 mg | ORAL_TABLET | Freq: Every day | ORAL | 2 refills | Status: DC

## 2019-07-30 MED ORDER — ALBUTEROL SULFATE HFA 108 (90 BASE) MCG/ACT IN AERS: 2.0000 | INHALATION_SPRAY | Freq: Four times a day (QID) | RESPIRATORY_TRACT | 1 refills | Status: DC | PRN

## 2019-07-30 MED ORDER — AMLODIPINE BESYLATE 10 MG PO TABS: 10.0000 mg | ORAL_TABLET | Freq: Every day | ORAL | 0 refills | Status: DC

## 2019-07-30 MED ORDER — LISINOPRIL 40 MG PO TABS: 40.0000 mg | ORAL_TABLET | Freq: Every day | ORAL | 0 refills | Status: DC

## 2019-07-30 MED FILL — ALBUTEROL SULFATE HFA 108 (: 108 (90 BAS | 25 days supply | Qty: 18 | Fill #0

## 2019-07-30 MED FILL — ?metFORMIN HCL 1000MG TABL: 1000 | 30 days supply | Qty: 60 | Fill #0

## 2019-07-30 MED FILL — ?METOPROLOL SUCC ER 25MG TA: 25 | 30 days supply | Qty: 30 | Fill #0

## 2019-07-30 MED FILL — LISINOPRIL 40 MG TABLET: 40 | 30 days supply | Qty: 30 | Fill #0

## 2019-07-30 NOTE — Progress Notes (Deleted)
Referring-Ms. Geryl Rankins, NP Reason for referral-coronary artery disease  HPI: 48 year old male for evaluation of coronary artery disease at request of Geryl Rankins, NP.  Patient seen previously but not since August 2017. Patient suffered a non-ST elevation myocardial infarction February 2014.  At that time he had a drug-eluting stent to his mid right coronary artery and a drug-eluting stent to his mid LAD.  His ejection fraction was 55 to 60%.  Nuclear study 2016 showed ejection fraction 49% with partially reversible inferior defect consistent with diaphragmatic attenuation and very mild inferior ischemia.  Treated medically.  Echocardiogram August 2017 showed normal LV function.  Current Outpatient Medications  Medication Sig Dispense Refill  . acetaminophen (TYLENOL) 500 MG tablet Take 2 tablets (1,000 mg total) by mouth every 8 (eight) hours as needed for moderate pain. 30 tablet 0  . albuterol (VENTOLIN HFA) 108 (90 Base) MCG/ACT inhaler Inhale 2 puffs into the lungs every 6 (six) hours as needed for wheezing or shortness of breath. 18 g 1  . amLODipine (NORVASC) 10 MG tablet Take 1 tablet (10 mg total) by mouth daily. 90 tablet 0  . atorvastatin (LIPITOR) 80 MG tablet Take 1 tablet (80 mg total) by mouth daily. 90 tablet 0  . betamethasone dipropionate (DIPROLENE) 0.05 % cream Apply topically 2 (two) times daily. Patient will pick up scripts today. 45 g 1  . Blood Glucose Monitoring Suppl (TRUE METRIX METER) w/Device KIT Needs new meter. We tested the his current one with a new battery and it is defective. Patient will pick up scripts today. 1 kit 0  . Coal Tar Extract (PSORIASIN EX) Apply 1 application topically as needed (for psoriasis).    . gabapentin (NEURONTIN) 300 MG capsule Take 1 capsule (300 mg total) by mouth at bedtime. 90 capsule 0  . glucose blood test strip Use as instructed 100 each 12  . hydrochlorothiazide (HYDRODIURIL) 25 MG tablet Take 1 tablet (25 mg total) by  mouth daily. 90 tablet 3  . lisinopril (ZESTRIL) 40 MG tablet Take 1 tablet (40 mg total) by mouth daily. 90 tablet 0  . metFORMIN (GLUCOPHAGE) 1000 MG tablet Take 1 tablet (1,000 mg total) by mouth 2 (two) times daily with a meal. 180 tablet 1  . metoprolol succinate (TOPROL-XL) 25 MG 24 hr tablet Take 1 tablet (25 mg total) by mouth daily. 90 tablet 2  . nitroGLYCERIN (NITROSTAT) 0.4 MG SL tablet DISSOLVE ONE TABLET UNDER THE TONGUE EVERY 5 MINUTES AS NEEDED FOR CHEST PAIN.  DO NOT EXCEED A TOTAL OF 3 DOSES IN 15 MINUTES 25 tablet 1  . omeprazole (PRILOSEC) 40 MG capsule Take 1 capsule (40 mg total) by mouth daily. Will pick up today. 90 capsule 2  . TRUEPLUS LANCETS 26G MISC 1 each by Does not apply route every 8 (eight) hours as needed. 100 each 12   No current facility-administered medications for this visit.     No Known Allergies  Past Medical History:  Diagnosis Date  . CAD (coronary artery disease), native coronary artery 12/21/12   a. NSTEMI 2/14 => LHC 12/21/12: mLAD 90%, OM1 20%, mRCA 100%, EF 55-60%.  PCI: Xience Xpedition DES to mRCA and Xience Xpedition DES to mLAD.   . Diabetes mellitus without complication ([place])   . GERD (gastroesophageal reflux disease)   . Hyperlipidemia   . Hypertension   . NSTEMI   . Psoriasis   . Tobacco abuse     Past Surgical History:  Procedure Laterality Date  .  CORONARY ANGIOPLASTY WITH STENT PLACEMENT  12/21/12   90% mid LAD s/p DES, 20% OM1, 100% mid RCA s/p DES; LVEF 55-60%  . LEFT HEART CATHETERIZATION WITH CORONARY ANGIOGRAM N/A 12/21/2012   Procedure: LEFT HEART CATHETERIZATION WITH CORONARY ANGIOGRAM;  Surgeon: Burnell Blanks, MD;  Location: [facility] CATH LAB;  Service: Cardiovascular;  Laterality: N/A;    Social History   Socioeconomic History  . Marital status: Single    Spouse name: Not on file  . Number of children: Not on file  . Years of education: Not on file  . Highest education level: Not on file  Occupational  History  . Not on file  Social Needs  . Financial resource strain: Not on file  . Food insecurity    Worry: Not on file    Inability: Not on file  . Transportation needs    Medical: Not on file    Non-medical: Not on file  Tobacco Use  . Smoking status: Former Smoker    Packs/day: 0.25    Years: 20.00    Pack years: 5.00    Types: Cigarettes    Quit date: 02/20/2016    Years since quitting: 3.4  . Smokeless tobacco: Never Used  . Tobacco comment: Smoke Marjiuana OCC.   Substance and Sexual Activity  . Alcohol use: Yes    Alcohol/week: 1.0 standard drinks    Types: 1 Standard drinks or equivalent per week    Frequency: Never    Comment: rarely   . Drug use: Yes    Types: Marijuana  . Sexual activity: Not on file  Lifestyle  . Physical activity    Days per week: Not on file    Minutes per session: Not on file  . Stress: Not on file  Relationships  . Social Herbalist on phone: Not on file    Gets together: Not on file    Attends religious service: Not on file    Active member of club or organization: Not on file    Attends meetings of clubs or organizations: Not on file    Relationship status: Not on file  . Intimate partner violence    Fear of current or ex partner: Not on file    Emotionally abused: Not on file    Physically abused: Not on file    Forced sexual activity: Not on file  Other Topics Concern  . Not on file  Social History Narrative  . Not on file    Family History  Problem Relation Age of Onset  . Heart attack Father   . Heart attack Mother     ROS: no fevers or chills, productive cough, hemoptysis, dysphasia, odynophagia, melena, hematochezia, dysuria, hematuria, rash, seizure activity, orthopnea, PND, pedal edema, claudication. Remaining systems are negative.  Physical Exam:   There were no vitals taken for this visit.  General:  Well developed/well nourished in NAD Skin warm/dry Patient not depressed No peripheral clubbing  Back-normal HEENT-normal/normal eyelids Neck supple/normal carotid upstroke bilaterally; no bruits; no JVD; no thyromegaly chest - CTA/ normal expansion CV - RRR/normal S1 and S2; no murmurs, rubs or gallops;  PMI nondisplaced Abdomen -NT/ND, no HSM, no mass, + bowel sounds, no bruit 2+ femoral pulses, no bruits Ext-no edema, chords, 2+ DP Neuro-grossly nonfocal  ECG - personally reviewed  A/P  1 coronary artery disease-  2 hypertension-patient's blood pressure is controlled.  Continue present medications and follow.  3 hyperlipidemia-  4 chest pain-  5  Tobacco abuse-  Kirk Ruths, MD

## 2019-07-30 NOTE — Progress Notes (Signed)
Virtual Visit via Telephone Note Due to national recommendations of social distancing due to COVID 19, telehealth visit is felt to be most appropriate for this patient at this time.  I discussed the limitations, risks, security and privacy concerns of performing an evaluation and management service by telephone and the availability of in person appointments. I also discussed with the patient that there may be a patient responsible charge related to this service. The patient expressed understanding and agreed to proceed.    I connected with Bradley Wolfe. on 07/30/19  at  11:10 AM EDT  EDT by telephone and verified that I am speaking with the correct person using two identifiers.   Consent I discussed the limitations, risks, security and privacy concerns of performing an evaluation and management service by telephone and the availability of in person appointments. I also discussed with the patient that there may be a patient responsible charge related to this service. The patient expressed understanding and agreed to proceed.   Location of Patient: Private  Residence   Location of Provider: [facility]    Persons participating in Telemedicine visit: [name] FNP-BC [place] CMA Bradley Wolfe.    History of Present Illness: Telemedicine visit for: Follow up for HTN   Essential Hypertension Chronic and well controlled. He took his blood pressure with his monitor during tele visit with reading: 135/76 HR 58. Denies chest pain, shortness of breath, palpitations, lightheadedness, dizziness, headaches or BLE edema.  Taking amlodipine 10 mg, hydrochlorothiazide 25 mg, lisinopril 40 mg and Toprol-XL 25 mg daily as prescribed. Denies chest pain, shortness of breath, palpitations, lightheadedness, dizziness, headaches or BLE edema.  BP Readings from Last 3 Encounters:  04/25/19 131/71  12/01/18 (!) 167/82  11/02/18 (!) 160/76   Plantar wart   Notes "has been there for years".  There is pain when pressure is applied and with walking. Located on bottom of right foot. Denies any signs of infection.     Past Medical History:  Diagnosis Date  . CAD (coronary artery disease), native coronary artery 12/21/12   a. NSTEMI 2/14 => LHC 12/21/12: mLAD 90%, OM1 20%, mRCA 100%, EF 55-60%.  PCI: Xience Xpedition DES to mRCA and Xience Xpedition DES to mLAD.   . Diabetes mellitus without complication (HCC)   . GERD (gastroesophageal reflux disease)   . Hyperlipidemia   . Hypertension   . NSTEMI   . Psoriasis   . Tobacco abuse     Past Surgical History:  Procedure Laterality Date  . CORONARY ANGIOPLASTY WITH STENT PLACEMENT  12/21/12   90% mid LAD s/p DES, 20% OM1, 100% mid RCA s/p DES; LVEF 55-60%  . LEFT HEART CATHETERIZATION WITH CORONARY ANGIOGRAM N/A 12/21/2012   Procedure: LEFT HEART CATHETERIZATION WITH CORONARY ANGIOGRAM;  Surgeon: Kathleene Hazel, MD;  Location: [facility] CATH LAB;  Service: Cardiovascular;  Laterality: N/A;    Family History  Problem Relation Age of Onset  . Heart attack Father   . Heart attack Mother     Social History   Socioeconomic History  . Marital status: Single    Spouse name: Not on file  . Number of children: Not on file  . Years of education: Not on file  . Highest education level: Not on file  Occupational History  . Not on file  Social Needs  . Financial resource strain: Not on file  . Food insecurity    Worry: Not on file  Inability: Not on file  . Transportation needs    Medical: Not on file    Non-medical: Not on file  Tobacco Use  . Smoking status: Former Smoker    Packs/day: 0.25    Years: 20.00    Pack years: 5.00    Types: Cigarettes    Quit date: 02/20/2016    Years since quitting: 3.4  . Smokeless tobacco: Never Used  . Tobacco comment: Smoke Marjiuana OCC.   Substance and Sexual Activity  . Alcohol use: Yes    Alcohol/week: 1.0 standard drinks    Types: 1  Standard drinks or equivalent per week    Frequency: Never    Comment: rarely   . Drug use: Yes    Types: Marijuana  . Sexual activity: Not on file  Lifestyle  . Physical activity    Days per week: Not on file    Minutes per session: Not on file  . Stress: Not on file  Relationships  . Social Herbalist on phone: Not on file    Gets together: Not on file    Attends religious service: Not on file    Active member of club or organization: Not on file    Attends meetings of clubs or organizations: Not on file    Relationship status: Not on file  Other Topics Concern  . Not on file  Social History Narrative  . Not on file     Observations/Objective: Awake, alert and oriented x 3   Review of Systems  Constitutional: Negative for fever, malaise/fatigue and weight loss.  HENT: Negative.  Negative for nosebleeds.   Eyes: Negative.  Negative for blurred vision, double vision and photophobia.  Respiratory: Positive for shortness of breath (with change of weather). Negative for cough.   Cardiovascular: Negative.  Negative for chest pain, palpitations and leg swelling.  Gastrointestinal: Positive for heartburn. Negative for nausea and vomiting.  Musculoskeletal:       SEE HPI  Skin:       psoriasis  Neurological: Negative.  Negative for dizziness, focal weakness, seizures and headaches.  Psychiatric/Behavioral: Negative.  Negative for suicidal ideas.    Assessment and Plan:  Diagnoses and all orders for this visit:  Essential hypertension -     lisinopril (ZESTRIL) 40 MG tablet; Take 1 tablet (40 mg total) by mouth daily. -     amLODipine (NORVASC) 10 MG tablet; Take 1 tablet (10 mg total) by mouth daily. -     metoprolol succinate (TOPROL-XL) 25 MG 24 hr tablet; Take 1 tablet (25 mg total) by mouth daily. Continue all antihypertensives as prescribed.  Remember to bring in your blood pressure log with you for your follow up appointment.  DASH/Mediterranean Diets are  healthier choices for HTN.   Plantar wart of right foot -     Ambulatory referral to Podiatry  Hyperlipidemia LDL goal <70 -     atorvastatin (LIPITOR) 80 MG tablet; Take 1 tablet (80 mg total) by mouth daily. INSTRUCTIONS: Work on a low fat, heart healthy diet and participate in regular aerobic exercise program by working out at least 150 minutes per week; 5 days a week-30 minutes per day. Avoid red meat, fried foods. junk foods, sodas, sugary drinks, unhealthy snacking, alcohol and smoking.  Drink at least 48oz of water per day and monitor your carbohydrate intake daily.  Well controlled. LDL at goal Lab Results  Component Value Date   LDLCALC 30 04/25/2019    Diabetes mellitus  type 2, uncontrolled, with complications (HCC) -     metFORMIN (GLUCOPHAGE) 1000 MG tablet; Take 1 tablet (1,000 mg total) by mouth 2 (two) times daily with a meal. Lab Results  Component Value Date   HGBA1C 6.8 (A) 04/25/2019  Controlled.  Continue blood sugar control as discussed in office today, low carbohydrate diet, and regular physical exercise as tolerated, 150 minutes per week (30 min each day, 5 days per week, or 50 min 3 days per week). Keep blood sugar logs with fasting goal of 90-130 mg/dl, post prandial (after you eat) less than 180.  For Hypoglycemia: BS <60 and Hyperglycemia BS >400; contact the clinic ASAP. Annual eye exams and foot exams are recommended.   GERD without esophagitis -     omeprazole (PRILOSEC) 40 MG capsule; Take 1 capsule (40 mg total) by mouth daily. Will pick up today. INSTRUCTIONS: Avoid GERD Triggers: acidic, spicy or fried foods, caffeine, coffee, sodas,  alcohol and chocolate.   Simple chronic bronchitis (HCC) -     albuterol (VENTOLIN HFA) 108 (90 Base) MCG/ACT inhaler; Inhale 2 puffs into the lungs every 6 (six) hours as needed for wheezing or shortness of breath.     Follow Up Instructions Return in about 3 months (around 10/30/2019).     I discussed the  assessment and treatment plan with the patient. The patient was provided an opportunity to ask questions and all were answered. The patient agreed with the plan and demonstrated an understanding of the instructions.   The patient was advised to call back or seek an in-person evaluation if the symptoms worsen or if the condition fails to improve as anticipated.  I provided 24 minutes of non-face-to-face time during this encounter including median intraservice time, reviewing previous notes, labs, imaging, medications and explaining diagnosis and management.  Claiborne RiggZelda W Xiao Graul, FNP-BC

## 2019-07-31 ENCOUNTER — Ambulatory Visit: Payer: Self-pay | Attending: Nurse Practitioner | Admitting: Pharmacist

## 2019-07-31 ENCOUNTER — Other Ambulatory Visit: Payer: Self-pay

## 2019-07-31 DIAGNOSIS — Z23 Encounter for immunization: Secondary | ICD-10-CM

## 2019-07-31 MED FILL — ?HYDROCHLOROTHIAZIDE 25MG T: 25 | 90 days supply | Qty: 90 | Fill #1

## 2019-07-31 NOTE — Progress Notes (Signed)
Patient presents for vaccination against influenza per orders of Zelda. Consent given. Counseling provided. No contraindications exists. Vaccine administered without incident.   

## 2019-08-01 ENCOUNTER — Encounter: Payer: Self-pay | Admitting: Nurse Practitioner

## 2019-08-10 ENCOUNTER — Ambulatory Visit: Payer: Self-pay | Admitting: Cardiology

## 2019-08-20 ENCOUNTER — Encounter: Payer: Self-pay | Admitting: Cardiology

## 2019-08-27 MED FILL — GABAPENTIN 300 MG CAPSULE: 300 | 30 days supply | Qty: 30 | Fill #0

## 2019-09-14 MED FILL — OMEPRAZOLE DR 40 MG CAPSULE: 40 | 30 days supply | Qty: 30 | Fill #3

## 2019-09-14 MED FILL — ?METOPROLOL SUCC ER 25MG TA: 25 | 30 days supply | Qty: 30 | Fill #1

## 2019-09-28 NOTE — Progress Notes (Signed)
Roderic Palau, NP Reason for referral-coronary artery disease  HPI: 48 year old male for evaluation of coronary artery disease at request of Geryl Rankins, NP.  Patient seen previously but not since August 2017.  Patient is status post non-ST elevation myocardial infarction in 2014.  He had a drug-eluting stent to his right coronary artery and a drug-eluting stent to his LAD at that time.  His ejection fraction was 55 to 60%.  Nuclear study July 2016 showed diaphragmatic attenuation versus prior inferior infarct.  Medical therapy recommended.  Most recent echocardiogram August 2017 showed normal LV function.  Patient has an occasional sharp pain in various locations on his chest that last 1 second.  Not exertional.  No associated symptoms.  He does not have exertional chest pain and denies dyspnea on exertion, orthopnea, PND, pedal edema or syncope.  Cardiology asked to evaluate.  Current Outpatient Medications  Medication Sig Dispense Refill  . acetaminophen (TYLENOL) 500 MG tablet Take 2 tablets (1,000 mg total) by mouth every 8 (eight) hours as needed for moderate pain. 30 tablet 0  . albuterol (VENTOLIN HFA) 108 (90 Base) MCG/ACT inhaler Inhale 2 puffs into the lungs every 6 (six) hours as needed for wheezing or shortness of breath. 18 g 1  . amLODipine (NORVASC) 10 MG tablet Take 1 tablet (10 mg total) by mouth daily. 90 tablet 0  . atorvastatin (LIPITOR) 80 MG tablet Take 1 tablet (80 mg total) by mouth daily. 90 tablet 0  . betamethasone dipropionate (DIPROLENE) 0.05 % cream Apply topically 2 (two) times daily. Patient will pick up scripts today. 45 g 1  . Blood Glucose Monitoring Suppl (TRUE METRIX METER) w/Device KIT Needs new meter. We tested the his current one with a new battery and it is defective. Patient will pick up scripts today. 1 kit 0  . Coal Tar Extract (PSORIASIN EX) Apply 1 application topically as needed (for psoriasis).    . gabapentin (NEURONTIN) 300 MG capsule  Take 1 capsule (300 mg total) by mouth at bedtime. 90 capsule 0  . glucose blood test strip Use as instructed 100 each 12  . hydrochlorothiazide (HYDRODIURIL) 25 MG tablet Take 1 tablet (25 mg total) by mouth daily. 90 tablet 3  . lisinopril (ZESTRIL) 40 MG tablet Take 1 tablet (40 mg total) by mouth daily. 90 tablet 0  . metFORMIN (GLUCOPHAGE) 1000 MG tablet Take 1 tablet (1,000 mg total) by mouth 2 (two) times daily with a meal. 180 tablet 1  . metoprolol succinate (TOPROL-XL) 25 MG 24 hr tablet Take 1 tablet (25 mg total) by mouth daily. 90 tablet 2  . nitroGLYCERIN (NITROSTAT) 0.4 MG SL tablet DISSOLVE ONE TABLET UNDER THE TONGUE EVERY 5 MINUTES AS NEEDED FOR CHEST PAIN.  DO NOT EXCEED A TOTAL OF 3 DOSES IN 15 MINUTES 25 tablet 1  . omeprazole (PRILOSEC) 40 MG capsule Take 1 capsule (40 mg total) by mouth daily. Will pick up today. 90 capsule 2  . TRUEPLUS LANCETS 26G MISC 1 each by Does not apply route every 8 (eight) hours as needed. 100 each 12   No current facility-administered medications for this visit.    No Known Allergies   Past Medical History:  Diagnosis Date  . CAD (coronary artery disease), native coronary artery 12/21/12   a. NSTEMI 2/14 => LHC 12/21/12: mLAD 90%, OM1 20%, mRCA 100%, EF 55-60%.  PCI: Xience Xpedition DES to mRCA and Xience Xpedition DES to mLAD.   . Diabetes mellitus without complication (  HCC)   . GERD (gastroesophageal reflux disease)   . Hyperlipidemia   . Hypertension   . NSTEMI   . Psoriasis   . Tobacco abuse     Past Surgical History:  Procedure Laterality Date  . CORONARY ANGIOPLASTY WITH STENT PLACEMENT  12/21/12   90% mid LAD s/p DES, 20% OM1, 100% mid RCA s/p DES; LVEF 55-60%  . LEFT HEART CATHETERIZATION WITH CORONARY ANGIOGRAM N/A 12/21/2012   Procedure: LEFT HEART CATHETERIZATION WITH CORONARY ANGIOGRAM;  Surgeon: Burnell Blanks, MD;  Location: [facility] CATH LAB;  Service: Cardiovascular;  Laterality: N/A;    Social History    Socioeconomic History  . Marital status: Single    Spouse name: Not on file  . Number of children: Not on file  . Years of education: Not on file  . Highest education level: Not on file  Occupational History  . Not on file  Tobacco Use  . Smoking status: Former Smoker    Packs/day: 0.25    Years: 20.00    Pack years: 5.00    Types: Cigarettes    Quit date: 02/20/2016    Years since quitting: 3.6  . Smokeless tobacco: Never Used  . Tobacco comment: Smoke [place] OCC.   Substance and Sexual Activity  . Alcohol use: Yes    Alcohol/week: 1.0 standard drinks    Types: 1 Standard drinks or equivalent per week    Comment: rarely   . Drug use: Yes    Types: Marijuana  . Sexual activity: Not on file  Other Topics Concern  . Not on file  Social History Narrative  . Not on file   Social Determinants of Health   Financial Resource Strain:   . Difficulty of Paying Living Expenses: Not on file  Food Insecurity:   . Worried About Charity fundraiser in the Last Year: Not on file  . Ran Out of Food in the Last Year: Not on file  Transportation Needs:   . Lack of Transportation (Medical): Not on file  . Lack of Transportation (Non-Medical): Not on file  Physical Activity:   . Days of Exercise per Week: Not on file  . Minutes of Exercise per Session: Not on file  Stress:   . Feeling of Stress : Not on file  Social Connections:   . Frequency of Communication with Friends and Family: Not on file  . Frequency of Social Gatherings with Friends and Family: Not on file  . Attends Religious Services: Not on file  . Active Member of Clubs or Organizations: Not on file  . Attends Archivist Meetings: Not on file  . Marital Status: Not on file  Intimate Partner Violence:   . Fear of Current or Ex-Partner: Not on file  . Emotionally Abused: Not on file  . Physically Abused: Not on file  . Sexually Abused: Not on file    Family History  Problem Relation Age of Onset  .  Heart attack Father   . Heart attack Mother     ROS: no fevers or chills, productive cough, hemoptysis, dysphasia, odynophagia, melena, hematochezia, dysuria, hematuria, rash, seizure activity, orthopnea, PND, pedal edema, claudication. Remaining systems are negative.  Physical Exam:   Blood pressure 140/78, pulse (!) 55, height '5\' 8"'$  (1.727 m), weight 166 lb 12.8 oz (75.7 kg).  General:  Well developed/well nourished in NAD Skin warm/dry Patient not depressed No peripheral clubbing Back-normal HEENT-normal/normal eyelids Neck supple/normal carotid upstroke bilaterally; no bruits; no JVD; no  thyromegaly chest - CTA/ normal expansion CV - RRR/normal S1 and S2; no murmurs, rubs or gallops;  PMI nondisplaced Abdomen -NT/ND, no HSM, no mass, + bowel sounds, no bruit 2+ femoral pulses, no bruits Ext-no edema, chords, 2+ DP Neuro-grossly nonfocal  ECG -sinus bradycardia at a rate of 55, occasional PVC, RV conduction delay.  Left ventricular hypertrophy.  Personally reviewed  A/P  1 coronary artery disease-plan to continue aspirin and Lipitor 80 mg daily.  2 hyperlipidemia-continue statin.  Check liver functions.  Total cholesterol in July was 98 with LDL 30  3 hypertension-patient's blood pressure is borderline.  I have asked him to track this and we will add additional medications as needed.  4 marijuana abuse-patient counseled on discontinuing.  5 chest pain-symptoms are atypical.  They do not sound cardiac.  He does not have exertional chest pain.  We will not pursue further ischemia evaluation unless his symptoms change.  Kirk Ruths, MD

## 2019-10-03 MED FILL — ?AMLODIPINE BESYLATE 10 MG: 10 | 30 days supply | Qty: 30 | Fill #2

## 2019-10-03 MED FILL — LISINOPRIL 40 MG TABLET: 40 | 30 days supply | Qty: 30 | Fill #1

## 2019-10-05 ENCOUNTER — Ambulatory Visit (INDEPENDENT_AMBULATORY_CARE_PROVIDER_SITE_OTHER): Payer: Self-pay | Admitting: Cardiology

## 2019-10-05 ENCOUNTER — Other Ambulatory Visit: Payer: Self-pay

## 2019-10-05 ENCOUNTER — Encounter: Payer: Self-pay | Admitting: Cardiology

## 2019-10-05 VITALS — BP 140/78 | HR 55 | Ht 68.0 in | Wt 166.8 lb

## 2019-10-05 DIAGNOSIS — I10 Essential (primary) hypertension: Secondary | ICD-10-CM

## 2019-10-05 DIAGNOSIS — E7800 Pure hypercholesterolemia, unspecified: Secondary | ICD-10-CM

## 2019-10-05 DIAGNOSIS — I2510 Atherosclerotic heart disease of native coronary artery without angina pectoris: Secondary | ICD-10-CM

## 2019-10-05 NOTE — Patient Instructions (Signed)
Medication Instructions:  NO CHANGE *If you need a refill on your cardiac medications before your next appointment, please call your pharmacy*  Lab Work: Your physician recommends that you HAVE LAB WORK TODAY If you have labs (blood work) drawn today and your tests are completely normal, you will receive your results only by: . MyChart Message (if you have MyChart) OR . A paper copy in the mail If you have any lab test that is abnormal or we need to change your treatment, we will call you to review the results.  Follow-Up: At [facility], you and your health needs are our priority.  As part of our continuing mission to provide you with exceptional heart care, we have created designated Provider Care Teams.  These Care Teams include your primary Cardiologist (physician) and Advanced Practice Providers (APPs -  Physician Assistants and Nurse Practitioners) who all work together to provide you with the care you need, when you need it.  Your next appointment:   12 month(s)  The format for your next appointment:   Either In Person or Virtual  Provider:   You may see BRIAN CRENSHAW MD or one of the following Advanced Practice Providers on your designated Care Team:    Luke Kilroy, PA-C  Callie Goodrich, PA-C  Jesse Cleaver, FNP    

## 2019-10-06 LAB — HEPATIC FUNCTION PANEL
ALT: 21 IU/L (ref 0–44)
AST: 19 IU/L (ref 0–40)
Albumin: 4.6 g/dL (ref 4.0–5.0)
Alkaline Phosphatase: 99 IU/L (ref 39–117)
Bilirubin Total: 0.5 mg/dL (ref 0.0–1.2)
Bilirubin, Direct: 0.13 mg/dL (ref 0.00–0.40)
Total Protein: 7.1 g/dL (ref 6.0–8.5)

## 2019-10-08 ENCOUNTER — Encounter: Payer: Self-pay | Admitting: *Deleted

## 2019-10-30 ENCOUNTER — Ambulatory Visit: Payer: Self-pay | Admitting: Nurse Practitioner

## 2019-11-02 ENCOUNTER — Other Ambulatory Visit: Payer: Self-pay | Admitting: Nurse Practitioner

## 2019-11-02 DIAGNOSIS — I25118 Atherosclerotic heart disease of native coronary artery with other forms of angina pectoris: Secondary | ICD-10-CM

## 2019-11-02 MED FILL — metFORMIN HCL 1000 MG TABS: 1000 | 30 days supply | Qty: 60 | Fill #1

## 2019-11-02 MED FILL — OMEPRAZOLE DR 40 MG CAPSULE: 40 | 30 days supply | Qty: 30 | Fill #4

## 2019-11-02 MED FILL — BETAMETHASONE DP 0.05% CRM: 0.05 | 14 days supply | Qty: 45 | Fill #1

## 2019-11-02 MED FILL — ATORVASTATIN 80 MG TABLET: 80 | 30 days supply | Qty: 30 | Fill #2

## 2019-11-05 MED FILL — NITROGLYCERIN 0.4 MG TAB SL: 0.4 | 25 days supply | Qty: 25 | Fill #0

## 2019-11-12 ENCOUNTER — Other Ambulatory Visit: Payer: Self-pay

## 2019-11-12 ENCOUNTER — Ambulatory Visit: Payer: Self-pay | Attending: Nurse Practitioner | Admitting: Nurse Practitioner

## 2019-11-12 ENCOUNTER — Encounter: Payer: Self-pay | Admitting: Nurse Practitioner

## 2019-11-12 VITALS — BP 123/67 | HR 52 | Temp 97.9°F | Ht 68.0 in | Wt 171.0 lb

## 2019-11-12 DIAGNOSIS — I10 Essential (primary) hypertension: Secondary | ICD-10-CM

## 2019-11-12 DIAGNOSIS — E1165 Type 2 diabetes mellitus with hyperglycemia: Secondary | ICD-10-CM

## 2019-11-12 DIAGNOSIS — E1142 Type 2 diabetes mellitus with diabetic polyneuropathy: Secondary | ICD-10-CM

## 2019-11-12 DIAGNOSIS — E118 Type 2 diabetes mellitus with unspecified complications: Secondary | ICD-10-CM

## 2019-11-12 DIAGNOSIS — K219 Gastro-esophageal reflux disease without esophagitis: Secondary | ICD-10-CM

## 2019-11-12 DIAGNOSIS — IMO0002 Reserved for concepts with insufficient information to code with codable children: Secondary | ICD-10-CM

## 2019-11-12 DIAGNOSIS — E785 Hyperlipidemia, unspecified: Secondary | ICD-10-CM

## 2019-11-12 LAB — GLUCOSE, POCT (MANUAL RESULT ENTRY): POC Glucose: 92 mg/dl (ref 70–99)

## 2019-11-12 LAB — POCT GLYCOSYLATED HEMOGLOBIN (HGB A1C): Hemoglobin A1C: 7.2 % — AB (ref 4.0–5.6)

## 2019-11-12 MED ORDER — OMEPRAZOLE 40 MG PO CPDR: 40.0000 mg | DELAYED_RELEASE_CAPSULE | Freq: Every day | ORAL | 2 refills | Status: DC

## 2019-11-12 MED ORDER — GLUCOSE BLOOD VI STRP: ORAL_STRIP | 12 refills | Status: DC

## 2019-11-12 MED ORDER — GABAPENTIN 300 MG PO CAPS: 300.0000 mg | ORAL_CAPSULE | Freq: Three times a day (TID) | ORAL | 1 refills | Status: DC

## 2019-11-12 MED ORDER — METFORMIN HCL 1000 MG PO TABS: 1000.0000 mg | ORAL_TABLET | Freq: Two times a day (BID) | ORAL | 1 refills | Status: DC

## 2019-11-12 MED ORDER — LISINOPRIL 40 MG PO TABS: 40.0000 mg | ORAL_TABLET | Freq: Every day | ORAL | 0 refills | Status: DC

## 2019-11-12 MED ORDER — TRUEPLUS LANCETS 26G MISC: 1.0000 | Freq: Three times a day (TID) | 12 refills | Status: DC | PRN

## 2019-11-12 MED ORDER — METOPROLOL SUCCINATE ER 25 MG PO TB24: 25.0000 mg | ORAL_TABLET | Freq: Every day | ORAL | 2 refills | Status: DC

## 2019-11-12 MED ORDER — ATORVASTATIN CALCIUM 80 MG PO TABS: 80.0000 mg | ORAL_TABLET | Freq: Every day | ORAL | 0 refills | Status: DC

## 2019-11-12 MED ORDER — HYDROCHLOROTHIAZIDE 25 MG PO TABS: 25.0000 mg | ORAL_TABLET | Freq: Every day | ORAL | 3 refills | Status: DC

## 2019-11-12 MED ORDER — AMLODIPINE BESYLATE 10 MG PO TABS: 10.0000 mg | ORAL_TABLET | Freq: Every day | ORAL | 0 refills | Status: DC

## 2019-11-12 MED FILL — ?HYDROCHLOROTHIAZIDE 25MG T: 25 | 30 days supply | Qty: 30 | Fill #0

## 2019-11-12 MED FILL — GABAPENTIN 300 MG CAPSULE: 300 | 30 days supply | Qty: 90 | Fill #0

## 2019-11-12 MED FILL — TRUEplus LANCETS 28G MISC: 33 days supply | Qty: 100 | Fill #0

## 2019-11-12 MED FILL — ?AMLODIPINE BESYLATE 10 MG: 10 | 30 days supply | Qty: 30 | Fill #0

## 2019-11-12 MED FILL — METOPROLOL SUCCINATE ER 25: 25 | 30 days supply | Qty: 30 | Fill #0

## 2019-11-12 MED FILL — LISINOPRIL 40 MG TABLET: 40 | 30 days supply | Qty: 30 | Fill #0

## 2019-11-12 MED FILL — TRUE METRIX GLUCOSE TEST ST: 25 days supply | Qty: 100 | Fill #0

## 2019-11-12 NOTE — Progress Notes (Signed)
Assessment & Plan:  Bradley Wolfe was seen today for follow-up.  Diagnoses and all orders for this visit:  Diabetes mellitus type 2, uncontrolled, with complications (HCC) -     Glucose (CBG) -     HgB A1c -     metFORMIN (GLUCOPHAGE) 1000 MG tablet; Take 1 tablet (1,000 mg total) by mouth 2 (two) times daily with a meal. -     TRUEplus Lancets 26G MISC; 1 each by Does not apply route every 8 (eight) hours as needed. -     glucose blood test strip; Use as instructed  Essential hypertension -     CMP14+EGFR -     lisinopril (ZESTRIL) 40 MG tablet; Take 1 tablet (40 mg total) by mouth daily. -     hydrochlorothiazide (HYDRODIURIL) 25 MG tablet; Take 1 tablet (25 mg total) by mouth daily. -     amLODipine (NORVASC) 10 MG tablet; Take 1 tablet (10 mg total) by mouth daily. -     metoprolol succinate (TOPROL-XL) 25 MG 24 hr tablet; Take 1 tablet (25 mg total) by mouth daily.  Hyperlipidemia LDL goal <70 -     atorvastatin (LIPITOR) 80 MG tablet; Take 1 tablet (80 mg total) by mouth daily.  GERD without esophagitis -     omeprazole (PRILOSEC) 40 MG capsule; Take 1 capsule (40 mg total) by mouth daily. Will pick up today.  Diabetic polyneuropathy associated with type 2 diabetes mellitus (HCC) -     gabapentin (NEURONTIN) 300 MG capsule; Take 1 capsule (300 mg total) by mouth 3 (three) times daily.    Patient has been counseled on age-appropriate routine health concerns for screening and prevention. These are reviewed and up-to-date. Referrals have been placed accordingly. Immunizations are up-to-date or declined.    Subjective:   Chief Complaint  Patient presents with  . Follow-up    Pt. is here for a follow up on HTN and diabetes.    HPI Bradley Wolfe. 49 y.o. male presents to office today for follow up.  has a past medical history of CAD (coronary artery disease), native coronary artery (12/21/12), Diabetes mellitus without complication ([place]), GERD (gastroesophageal reflux  disease), Hyperlipidemia, Hypertension, NSTEMI, Psoriasis, and Tobacco abuse.   DM TYPE 2 He is not monitoring his blood glucose levels every day as prescribed.  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  I find Bradley Wolfe eating chinese buffet, [employer], fried foods.  He is not diet adherent. Endorses diabetic neuropathy with worsening symptoms however he is only taking gabapentin once nightly. I have instructed him to take this 3 times per day.  Lab Results  Component Value Date   HGBA1C 7.2 (A) 11/12/2019   Essential Hypertension Well controlled.  He does not monitor his blood pressure at home but endorses medication compliance taking amlodipine 10 mg daily, hydrochlorothiazide 25 mg daily, lisinopril 40 mg daily and metoprolol XL 25 mg daily. Denies chest pain, shortness of breath, palpitations, lightheadedness, dizziness, headaches or BLE edema.  He is not diet adherent when it comes to a [employer]. BP Readings from Last 3 Encounters:  11/12/19 123/67  10/05/19 140/78  04/25/19 131/71   Hyperlipidemia Patient presents for follow up to hyperlipidemia.  He is medication compliant taking atorvastatin 80 mg daily. He is not consistently diet compliant and denies  statin intolerance including myalgias.  LDL at  goal of less than 70. Lab Results  Component Value Date   CHOL 98 (L) 04/25/2019   Lab Results  Component Value Date   HDL 49 04/25/2019   Lab Results  Component Value Date   LDLCALC 30 04/25/2019   Lab Results  Component Value Date   TRIG 93 04/25/2019   Lab Results  Component Value Date   CHOLHDL 2.0 04/25/2019   Review of Systems  Constitutional: Negative for fever, malaise/fatigue and weight loss.  HENT: Negative.  Negative for nosebleeds.   Eyes: Negative.  Negative for blurred vision, double vision and photophobia.  Respiratory: Negative.  Negative for  cough and shortness of breath.   Cardiovascular: Negative.  Negative for chest pain, palpitations and leg swelling.  Gastrointestinal: Negative.  Negative for heartburn, nausea and vomiting.  Musculoskeletal: Negative.  Negative for myalgias.  Skin: Positive for itching and rash.       psoriasis  Neurological: Positive for sensory change. Negative for dizziness, focal weakness, seizures and headaches.       Peripheral neuropathy  Psychiatric/Behavioral: Negative.  Negative for suicidal ideas.    Past Medical History:  Diagnosis Date  . CAD (coronary artery disease), native coronary artery 12/21/12   a. NSTEMI 2/14 => LHC 12/21/12: mLAD 90%, OM1 20%, mRCA 100%, EF 55-60%.  PCI: Xience Xpedition DES to mRCA and Xience Xpedition DES to mLAD.   . Diabetes mellitus without complication ([place])   . GERD (gastroesophageal reflux disease)   . Hyperlipidemia   . Hypertension   . NSTEMI   . Psoriasis   . Tobacco abuse     Past Surgical History:  Procedure Laterality Date  . CORONARY ANGIOPLASTY WITH STENT PLACEMENT  12/21/12   90% mid LAD s/p DES, 20% OM1, 100% mid RCA s/p DES; LVEF 55-60%  . LEFT HEART CATHETERIZATION WITH CORONARY ANGIOGRAM N/A 12/21/2012   Procedure: LEFT HEART CATHETERIZATION WITH CORONARY ANGIOGRAM;  Surgeon: Burnell Blanks, MD;  Location: [facility] CATH LAB;  Service: Cardiovascular;  Laterality: N/A;    Family History  Problem Relation Age of Onset  . Heart attack Father   . Heart attack Mother     Social History Reviewed with no changes to be made today.   Outpatient Medications Prior to Visit  Medication Sig Dispense Refill  . acetaminophen (TYLENOL) 500 MG tablet Take 2 tablets (1,000 mg total) by mouth every 8 (eight) hours as needed for moderate pain. 30 tablet 0  . albuterol (VENTOLIN HFA) 108 (90 Base) MCG/ACT inhaler Inhale 2 puffs into the lungs every 6 (six) hours as needed for wheezing or shortness of breath. 18 g 1  . betamethasone dipropionate  (DIPROLENE) 0.05 % cream Apply topically 2 (two) times daily. Patient will pick up scripts today. 45 g 1  . Blood Glucose Monitoring Suppl (TRUE METRIX METER) w/Device KIT Needs new meter. We tested the his current one with a new battery and it is defective. Patient will pick up scripts today. 1 kit 0  . Coal Tar Extract (PSORIASIN EX) Apply 1 application topically as needed (for psoriasis).    . nitroGLYCERIN (NITROSTAT) 0.4 MG SL tablet DISSOLVE ONE TABLET UNDER THE TONGUE EVERY 5 MINUTES AS NEEDED FOR CHEST PAIN. DO NOT EXCEED A TOTAL OF 3 DOSES IN 15 MINUTES 25 tablet 1  . gabapentin (NEURONTIN) 300 MG capsule Take 1 capsule (300 mg total) by mouth at bedtime. 90 capsule 0  . glucose blood test strip Use as instructed 100 each 12  . hydrochlorothiazide (  HYDRODIURIL) 25 MG tablet Take 1 tablet (25 mg total) by mouth daily. 90 tablet 3  . TRUEPLUS LANCETS 26G MISC 1 each by Does not apply route every 8 (eight) hours as needed. 100 each 12  . amLODipine (NORVASC) 10 MG tablet Take 1 tablet (10 mg total) by mouth daily. 90 tablet 0  . atorvastatin (LIPITOR) 80 MG tablet Take 1 tablet (80 mg total) by mouth daily. 90 tablet 0  . lisinopril (ZESTRIL) 40 MG tablet Take 1 tablet (40 mg total) by mouth daily. 90 tablet 0  . metFORMIN (GLUCOPHAGE) 1000 MG tablet Take 1 tablet (1,000 mg total) by mouth 2 (two) times daily with a meal. 180 tablet 1  . metoprolol succinate (TOPROL-XL) 25 MG 24 hr tablet Take 1 tablet (25 mg total) by mouth daily. 90 tablet 2  . omeprazole (PRILOSEC) 40 MG capsule Take 1 capsule (40 mg total) by mouth daily. Will pick up today. 90 capsule 2   No facility-administered medications prior to visit.    No Known Allergies     Objective:    BP 123/67 (BP Location: Left Arm, Patient Position: Sitting, Cuff Size: Normal)   Pulse (!) 52   Temp 97.9 F (36.6 C) (Oral)   Ht _0  (1.727 m)   Wt 171 lb (77.6 kg)   SpO2 99%   BMI 26.00 kg/m  Wt Readings from Last 3  Encounters:  11/12/19 171 lb (77.6 kg)  10/05/19 166 lb 12.8 oz (75.7 kg)  04/25/19 171 lb (77.6 kg)    Physical Exam Vitals and nursing note reviewed.  Constitutional:      Appearance: He is well-developed.  HENT:     Head: Normocephalic and atraumatic.  Cardiovascular:     Rate and Rhythm: Normal rate and regular rhythm.     Heart sounds: Normal heart sounds. No murmur. No friction rub. No gallop.   Pulmonary:     Effort: Pulmonary effort is normal. No tachypnea or respiratory distress.     Breath sounds: Normal breath sounds. No decreased breath sounds, wheezing, rhonchi or rales.  Chest:     Chest wall: No tenderness.  Abdominal:     General: Bowel sounds are normal.     Palpations: Abdomen is soft.  Musculoskeletal:        General: Normal range of motion.     Cervical back: Normal range of motion.  Skin:    General: Skin is warm and dry.  Neurological:     Mental Status: He is alert and oriented to person, place, and time.     Coordination: Coordination normal.  Psychiatric:        Behavior: Behavior normal. Behavior is cooperative.        Thought Content: Thought content normal.        Judgment: Judgment normal.          Patient has been counseled extensively about nutrition and exercise as well as the importance of adherence with medications and regular follow-up. The patient was given clear instructions to go to ER or return to medical center if symptoms don't improve, worsen or new problems develop. The patient verbalized understanding.   Follow-up: No follow-ups on file.   Gildardo Pounds, FNP-BC [facility] [place], [place]   11/12/2019, 4:18 PM

## 2019-11-12 NOTE — Patient Instructions (Signed)
[facility] ([place]) °Podiatrist in [place], [place] °COVID-19 info: [email or web address] °Get online care: [email or web address] °Address: [address], [place], [place] [address] °Phone: [phone] °Appointments: [email or web address] °

## 2019-11-13 LAB — CMP14+EGFR
ALT: 13 IU/L (ref 0–44)
AST: 17 IU/L (ref 0–40)
Albumin/Globulin Ratio: 2 (ref 1.2–2.2)
Albumin: 4.5 g/dL (ref 4.0–5.0)
Alkaline Phosphatase: 99 IU/L (ref 39–117)
BUN/Creatinine Ratio: 10 (ref 9–20)
BUN: 10 mg/dL (ref 6–24)
Bilirubin Total: 0.6 mg/dL (ref 0.0–1.2)
CO2: 27 mmol/L (ref 20–29)
Calcium: 9.6 mg/dL (ref 8.7–10.2)
Chloride: 101 mmol/L (ref 96–106)
Creatinine, Ser: 1.01 mg/dL (ref 0.76–1.27)
GFR calc Af Amer: 100 mL/min/{1.73_m2} (ref >59)
GFR calc non Af Amer: 87 mL/min/{1.73_m2} (ref >59)
Globulin, Total: 2.3 g/dL (ref 1.5–4.5)
Glucose: 89 mg/dL (ref 65–99)
Potassium: 3.9 mmol/L (ref 3.5–5.2)
Sodium: 140 mmol/L (ref 134–144)
Total Protein: 6.8 g/dL (ref 6.0–8.5)

## 2019-12-03 ENCOUNTER — Ambulatory Visit: Payer: Self-pay

## 2019-12-19 MED FILL — LISINOPRIL 40 MG TABLET: 40 | 30 days supply | Qty: 30 | Fill #1

## 2019-12-19 MED FILL — METOPROLOL SUCCINATE ER 25: 25 | 30 days supply | Qty: 30 | Fill #1

## 2019-12-19 MED FILL — OMEPRAZOLE DR 40 MG CAPSULE: 40 | 30 days supply | Qty: 30 | Fill #5

## 2020-01-07 ENCOUNTER — Ambulatory Visit: Payer: Self-pay | Attending: Nurse Practitioner

## 2020-01-07 ENCOUNTER — Other Ambulatory Visit: Payer: Self-pay

## 2020-01-07 ENCOUNTER — Other Ambulatory Visit: Payer: Self-pay | Admitting: Nurse Practitioner

## 2020-01-07 DIAGNOSIS — L409 Psoriasis, unspecified: Secondary | ICD-10-CM

## 2020-01-07 MED FILL — ATORVASTATIN 80 MG TABLET: 80 | 30 days supply | Qty: 30 | Fill #0

## 2020-01-07 MED FILL — metFORMIN HCL 1000 MG TABS: 1000 | 30 days supply | Qty: 60 | Fill #0

## 2020-01-07 NOTE — Telephone Encounter (Signed)
Please fill if patient may continue usage 

## 2020-01-08 MED FILL — BETAMETHASONE DP 0.05% CRM: 0.05 | 7 days supply | Qty: 45 | Fill #0

## 2020-01-22 ENCOUNTER — Telehealth: Payer: Self-pay | Admitting: Nurse Practitioner

## 2020-01-22 NOTE — Telephone Encounter (Signed)
Pt was sent a letter from financial dept. Inform them, that the application they submitted was incomplete, since they were missing some documentation at the time of the appointment, Pt need to reschedule and resubmit all new papers and application for CAFA and OC, P.S. old documents has been sent back by mail to the Pt and Pt. need to make a new appt. 

## 2020-02-01 ENCOUNTER — Ambulatory Visit: Payer: Self-pay | Attending: Internal Medicine

## 2020-02-01 DIAGNOSIS — Z23 Encounter for immunization: Secondary | ICD-10-CM

## 2020-02-01 NOTE — Progress Notes (Signed)
   [id number]  Name:  Ajene Carchi.    MRN: [id number] DOB: 1970-12-13  02/01/2020  Mr. Spisak was observed post Covid-19 immunization for 15 minutes without incident. He was provided with Vaccine Information Sheet and instruction to access the V-Safe system.   Mr. Routh was instructed to call 911 with any severe reactions post vaccine: [name] Difficulty breathing  . Swelling of face and throat  . A fast heartbeat  . A bad rash all over body  . Dizziness and weakness   Immunizations Administered    Name Date Dose VIS Date Route   Pfizer COVID-19 Vaccine 02/01/2020 11:47 AM 0.3 mL 10/05/2019 Intramuscular   Manufacturer: [employer], [employer]   Lot: [id number]   NDC: [address]-9

## 2020-02-25 ENCOUNTER — Ambulatory Visit: Payer: Self-pay | Attending: Internal Medicine

## 2020-02-25 DIAGNOSIS — Z23 Encounter for immunization: Secondary | ICD-10-CM

## 2020-02-25 NOTE — Progress Notes (Signed)
   [id number]  Name:  Bradley Wolfe.    MRN: [id number] DOB: 1971/07/09  02/25/2020  Bradley Wolfe was observed post Covid-19 immunization for 15 minutes without incident. He was provided with Vaccine Information Sheet and instruction to access the V-Safe system.   Bradley Wolfe was instructed to call 911 with any severe reactions post vaccine: [name] Difficulty breathing  . Swelling of face and throat  . A fast heartbeat  . A bad rash all over body  . Dizziness and weakness   Immunizations Administered    Name Date Dose VIS Date Route   Pfizer COVID-19 Vaccine 02/25/2020 10:36 AM 0.3 mL 12/19/2018 Intramuscular   Manufacturer: [employer], [employer]   Lot: [id number]   NDC: [address]-4

## 2020-02-27 MED FILL — OMEPRAZOLE DR 40 MG CAPSULE: 40 | 30 days supply | Qty: 30 | Fill #6

## 2020-02-27 MED FILL — HYDROCHLOROTHIAZIDE 25 MG T: 25 | 30 days supply | Qty: 30 | Fill #1

## 2020-03-13 MED FILL — GABAPENTIN 300 MG CAPSULE: 300 | 30 days supply | Qty: 90 | Fill #1

## 2020-03-14 MED FILL — metFORMIN HCL 1000 MG TABS: 1000 | 30 days supply | Qty: 60 | Fill #1

## 2020-03-14 MED FILL — ?METOPROLOL SUCC ER 25MG TA: 25 | 30 days supply | Qty: 30 | Fill #2

## 2020-03-28 MED FILL — LISINOPRIL 40 MG TABLET: 40 | 30 days supply | Qty: 30 | Fill #2

## 2020-03-28 MED FILL — ATORVASTATIN 80 MG TABLET: 80 | 30 days supply | Qty: 30 | Fill #1

## 2020-03-28 MED FILL — ?AMLODIPINE BESYL 10MG TABL: 10 | 30 days supply | Qty: 30 | Fill #1

## 2020-04-17 ENCOUNTER — Other Ambulatory Visit: Payer: Self-pay

## 2020-04-17 ENCOUNTER — Encounter (HOSPITAL_COMMUNITY): Payer: Self-pay | Admitting: Emergency Medicine

## 2020-04-17 ENCOUNTER — Emergency Department (HOSPITAL_COMMUNITY)
Admission: EM | Admit: 2020-04-17 | Discharge: 2020-04-17 | Disposition: A | Discharge status: Home / Self Care | Payer: Self-pay | Attending: Emergency Medicine | Admitting: Emergency Medicine

## 2020-04-17 DIAGNOSIS — R001 Bradycardia, unspecified: Secondary | ICD-10-CM | POA: Insufficient documentation

## 2020-04-17 DIAGNOSIS — Z87891 Personal history of nicotine dependence: Secondary | ICD-10-CM | POA: Insufficient documentation

## 2020-04-17 DIAGNOSIS — Y9389 Activity, other specified: Secondary | ICD-10-CM | POA: Insufficient documentation

## 2020-04-17 DIAGNOSIS — M79622 Pain in left upper arm: Secondary | ICD-10-CM | POA: Insufficient documentation

## 2020-04-17 DIAGNOSIS — Y9289 Other specified places as the place of occurrence of the external cause: Secondary | ICD-10-CM | POA: Insufficient documentation

## 2020-04-17 DIAGNOSIS — M25512 Pain in left shoulder: Secondary | ICD-10-CM | POA: Insufficient documentation

## 2020-04-17 DIAGNOSIS — I10 Essential (primary) hypertension: Secondary | ICD-10-CM | POA: Insufficient documentation

## 2020-04-17 DIAGNOSIS — X503XXA Overexertion from repetitive movements, initial encounter: Secondary | ICD-10-CM | POA: Insufficient documentation

## 2020-04-17 DIAGNOSIS — I2510 Atherosclerotic heart disease of native coronary artery without angina pectoris: Secondary | ICD-10-CM | POA: Insufficient documentation

## 2020-04-17 DIAGNOSIS — Z955 Presence of coronary angioplasty implant and graft: Secondary | ICD-10-CM | POA: Insufficient documentation

## 2020-04-17 DIAGNOSIS — Z79899 Other long term (current) drug therapy: Secondary | ICD-10-CM | POA: Insufficient documentation

## 2020-04-17 DIAGNOSIS — R202 Paresthesia of skin: Secondary | ICD-10-CM | POA: Insufficient documentation

## 2020-04-17 DIAGNOSIS — R200 Anesthesia of skin: Secondary | ICD-10-CM | POA: Insufficient documentation

## 2020-04-17 DIAGNOSIS — Y999 Unspecified external cause status: Secondary | ICD-10-CM | POA: Insufficient documentation

## 2020-04-17 DIAGNOSIS — E119 Type 2 diabetes mellitus without complications: Secondary | ICD-10-CM | POA: Insufficient documentation

## 2020-04-17 LAB — CBC WITH DIFFERENTIAL/PLATELET
Abs Immature Granulocytes: 0.02 10*3/uL (ref 0.00–0.07)
Basophils Absolute: 0.1 10*3/uL (ref 0.0–0.1)
Basophils Relative: 1 %
Eosinophils Absolute: 0.2 10*3/uL (ref 0.0–0.5)
Eosinophils Relative: 2 %
HCT: 38.7 % — ABNORMAL LOW (ref 39.0–52.0)
Hemoglobin: 13.1 g/dL (ref 13.0–17.0)
Immature Granulocytes: 0 %
Lymphocytes Relative: 64 %
Lymphs Abs: 5.4 10*3/uL — ABNORMAL HIGH (ref 0.7–4.0)
MCH: 30.5 pg (ref 26.0–34.0)
MCHC: 33.9 g/dL (ref 30.0–36.0)
MCV: 90 fL (ref 80.0–100.0)
Monocytes Absolute: 0.7 10*3/uL (ref 0.1–1.0)
Monocytes Relative: 9 %
Neutro Abs: 2 10*3/uL (ref 1.7–7.7)
Neutrophils Relative %: 24 %
Platelets: 230 10*3/uL (ref 150–400)
RBC: 4.3 MIL/uL (ref 4.22–5.81)
RDW: 12.3 % (ref 11.5–15.5)
WBC: 8.5 10*3/uL (ref 4.0–10.5)
nRBC: 0 % (ref 0.0–0.2)

## 2020-04-17 LAB — BASIC METABOLIC PANEL
Anion gap: 9 (ref 5–15)
BUN: 12 mg/dL (ref 6–20)
CO2: 26 mmol/L (ref 22–32)
Calcium: 9 mg/dL (ref 8.9–10.3)
Chloride: 104 mmol/L (ref 98–111)
Creatinine, Ser: 0.96 mg/dL (ref 0.61–1.24)
GFR calc Af Amer: >60 mL/min (ref >60)
GFR calc non Af Amer: >60 mL/min (ref >60)
Glucose, Bld: 111 mg/dL — ABNORMAL HIGH (ref 70–99)
Potassium: 3 mmol/L — ABNORMAL LOW (ref 3.5–5.1)
Sodium: 139 mmol/L (ref 135–145)

## 2020-04-17 MED ORDER — POTASSIUM CHLORIDE CRYS ER 20 MEQ PO TBCR
40.0000 meq | EXTENDED_RELEASE_TABLET | Freq: Once | ORAL | Status: AC
  Administered 2020-04-17: 40 meq via ORAL
  Filled 2020-04-17: qty 2

## 2020-04-17 NOTE — Discharge Instructions (Signed)
Please read and follow all provided instructions.  Your diagnoses today include:  1. Paresthesia of left arm    Tests performed today include:  Vital signs - see below for your results today  Blood counts and electrolytes - shows low potassium  Medications prescribed:   None  If you need to use over-the-counter medication for pain, take Tylenol as directed on the packaging.  Take any prescribed medications only as directed.  Home care instructions:   Follow any educational materials contained in this packet  Please rest, use ice or heat on your back for the next several days  Do not lift, push, pull anything more than 10 pounds for the next week  Follow-up instructions: Please follow-up with your primary care provider and/or the listed orthopedic referral in the next 1 week for further evaluation of your symptoms.   Return instructions:  SEEK IMMEDIATE MEDICAL ATTENTION IF YOU HAVE:  New numbness, tingling, weakness, or problem with the use of your arms or legs  Severe back pain not relieved with medications  Loss control of your bowels or bladder  Increasing pain in any areas of the body (such as chest or abdominal pain)  Shortness of breath, dizziness, or fainting.   Worsening nausea (feeling sick to your stomach), vomiting, fever, or sweats  Any other emergent concerns regarding your health   Additional Information:  Your vital signs today were: BP (!) 154/75 (BP Location: Right Arm)   Pulse (!) 47   Temp 98.5 F (36.9 C) (Oral)   Resp 15   Ht 5\' 8"  (1.727 m)   Wt 78 kg   SpO2 100%   BMI 26.15 kg/m  If your blood pressure (BP) was elevated above 135/85 this visit, please have this repeated by your doctor within one month. --------------

## 2020-04-17 NOTE — ED Triage Notes (Addendum)
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

## 2020-04-17 NOTE — ED Provider Notes (Signed)
[facility] EMERGENCY DEPARTMENT Provider Note   CSN: [id number] Arrival date & time: 04/17/20  0102     History Chief Complaint  Patient presents with  . Hand numbness/tingling    Bradley Wolfe. is a 49 y.o. male.  Patient with history of diabetes, coronary artery disease status post stenting, psoriasis --presents to the emergency department for numbness and tingling in his left hand.  Symptoms have been ongoing for the past 2 to 3 weeks.  He states that he has had tingling that involves the thumb, index finger and middle finger of the left hand on the palmar side and the back of the fingers.  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  He states that he does not have any significant problems while he works, but after work he develops the symptoms.  He does not have significant weakness but does report dropping a glass a time or 2.  He states that sometimes he has aching and throbbing pain in his left shoulder and upper arm.  This is intermittent and not currently present.  He denies any elbow pain.  No acute injuries.  Symptoms are mild and then gradually worsened.  He has used wrist bands at times with improvement.  Patient denies signs of stroke including: facial droop, slurred speech, aphasia, other weakness in extremities, imbalance/trouble walking.         Past Medical History:  Diagnosis Date  . CAD (coronary artery disease), native coronary artery 12/21/12   a. NSTEMI 2/14 => LHC 12/21/12: mLAD 90%, OM1 20%, mRCA 100%, EF 55-60%.  PCI: Xience Xpedition DES to mRCA and Xience Xpedition DES to mLAD.   . Diabetes mellitus without complication ([place])   . GERD (gastroesophageal reflux disease)   . Hyperlipidemia   . Hypertension   . NSTEMI   . Psoriasis   . Tobacco abuse     Patient Active Problem List   Diagnosis Date Noted  . Coronary artery disease 04/27/2019  .  Uncontrolled type 2 diabetes mellitus without complication, without long-term current use of insulin 07/01/2017  . Psoriasis 07/14/2016  . Jaw pain 07/14/2016  . Chest pain 05/31/2016  . Nausea without vomiting 05/31/2016  . Nausea 05/31/2016  . CAD (coronary artery disease), native coronary artery 12/22/2012  . Hyperlipidemia 12/22/2012  . NSTEMI (non-ST elevated myocardial infarction) ([place]) 12/21/2012  . Essential hypertension 12/21/2012  . Tobacco abuse 12/21/2012  . GERD (gastroesophageal reflux disease) 12/21/2012    Past Surgical History:  Procedure Laterality Date  . CORONARY ANGIOPLASTY WITH STENT PLACEMENT  12/21/12   90% mid LAD s/p DES, 20% OM1, 100% mid RCA s/p DES; LVEF 55-60%  . LEFT HEART CATHETERIZATION WITH CORONARY ANGIOGRAM N/A 12/21/2012   Procedure: LEFT HEART CATHETERIZATION WITH CORONARY ANGIOGRAM;  Surgeon: Burnell Blanks, MD;  Location: [facility] CATH LAB;  Service: Cardiovascular;  Laterality: N/A;       Family History  Problem Relation Age of Onset  . Heart attack Father   . Heart attack Mother     Social History   Tobacco Use  . Smoking status: Former Smoker    Packs/day: 0.25    Years: 20.00    Pack years: 5.00    Types: Cigarettes    Quit date: 02/20/2016    Years since quitting: 4.1  . Smokeless tobacco: Never Used  . Tobacco comment: Smoke [place] OCC.   Vaping Use  .  Vaping Use: Never used  Substance Use Topics  . Alcohol use: Yes    Alcohol/week: 1.0 standard drink    Types: 1 Standard drinks or equivalent per week    Comment: rarely   . Drug use: Yes    Types: Marijuana    Home Medications Prior to Admission medications   Medication Sig Start Date End Date Taking? Authorizing Provider  acetaminophen (TYLENOL) 500 MG tablet Take 2 tablets (1,000 mg total) by mouth every 8 (eight) hours as needed for moderate pain. 07/01/17   Alfonse Spruce, FNP  albuterol (VENTOLIN HFA) 108 (90 Base) MCG/ACT inhaler Inhale 2 puffs into  the lungs every 6 (six) hours as needed for wheezing or shortness of breath. 07/30/19   Gildardo Pounds, NP  amLODipine (NORVASC) 10 MG tablet Take 1 tablet (10 mg total) by mouth daily. 11/12/19 02/10/20  Gildardo Pounds, NP  atorvastatin (LIPITOR) 80 MG tablet Take 1 tablet (80 mg total) by mouth daily. 11/12/19 02/10/20  Gildardo Pounds, NP  betamethasone dipropionate 0.05 % cream APPLY TOPICALLY TWO TIMES DAILY. PATIENT WILL PICK UP SCRIPTS TODAY. 01/07/20   Gildardo Pounds, NP  Blood Glucose Monitoring Suppl (TRUE METRIX METER) w/Device KIT Needs new meter. We tested the his current one with a new battery and it is defective. Patient will pick up scripts today. 04/25/19   Gildardo Pounds, NP  Coal Tar Extract (PSORIASIN EX) Apply 1 application topically as needed (for psoriasis).    [provider]  gabapentin (NEURONTIN) 300 MG capsule Take 1 capsule (300 mg total) by mouth 3 (three) times daily. 11/12/19 12/12/19  Gildardo Pounds, NP  glucose blood test strip Use as instructed 11/12/19   Gildardo Pounds, NP  hydrochlorothiazide (HYDRODIURIL) 25 MG tablet Take 1 tablet (25 mg total) by mouth daily. 11/12/19   Gildardo Pounds, NP  lisinopril (ZESTRIL) 40 MG tablet Take 1 tablet (40 mg total) by mouth daily. 11/12/19 02/10/20  Gildardo Pounds, NP  metFORMIN (GLUCOPHAGE) 1000 MG tablet Take 1 tablet (1,000 mg total) by mouth 2 (two) times daily with a meal. 11/12/19 02/10/20  Gildardo Pounds, NP  metoprolol succinate (TOPROL-XL) 25 MG 24 hr tablet Take 1 tablet (25 mg total) by mouth daily. 11/12/19 02/10/20  Gildardo Pounds, NP  nitroGLYCERIN (NITROSTAT) 0.4 MG SL tablet DISSOLVE ONE TABLET UNDER THE TONGUE EVERY 5 MINUTES AS NEEDED FOR CHEST PAIN. DO NOT EXCEED A TOTAL OF 3 DOSES IN 15 MINUTES 11/02/19   Charlott Rakes, MD  omeprazole (PRILOSEC) 40 MG capsule Take 1 capsule (40 mg total) by mouth daily. Will pick up today. 11/12/19 02/10/20  Gildardo Pounds, NP  TRUEplus Lancets 26G MISC 1 each by  Does not apply route every 8 (eight) hours as needed. 11/12/19   Gildardo Pounds, NP    Allergies    Patient has no known allergies.  Review of Systems   Review of Systems  Constitutional: Negative for activity change.  Musculoskeletal: Positive for arthralgias and myalgias. Negative for back pain, gait problem, joint swelling and neck pain.  Skin: Negative for wound.  Neurological: Positive for numbness (tingling). Negative for weakness.    Physical Exam Updated Vital Signs BP (!) 154/75 (BP Location: Right Arm)   Pulse (!) 47   Temp 98.5 F (36.9 C) (Oral)   Resp 15   Ht _0  (1.727 m)   Wt 78 kg   SpO2 100%   BMI 26.15 kg/m  Physical Exam Vitals and nursing note reviewed.  Constitutional:      Appearance: He is well-developed.  HENT:     Head: Normocephalic and atraumatic.     Right Ear: Tympanic membrane, ear canal and external ear normal.     Left Ear: Tympanic membrane, ear canal and external ear normal.     Nose: Nose normal.     Mouth/Throat:     Pharynx: Uvula midline.  Eyes:     General: Lids are normal.     Conjunctiva/sclera: Conjunctivae normal.     Pupils: Pupils are equal, round, and reactive to light.  Cardiovascular:     Rate and Rhythm: Regular rhythm. Bradycardia present.  Pulmonary:     Effort: Pulmonary effort is normal.     Breath sounds: Normal breath sounds.  Abdominal:     Palpations: Abdomen is soft.     Tenderness: There is no abdominal tenderness.  Musculoskeletal:        General: No swelling. Normal range of motion.     Left shoulder: No tenderness. Normal range of motion.     Left upper arm: No tenderness.     Left elbow: No swelling. Normal range of motion.     Left forearm: No edema or tenderness.     Right wrist: No tenderness, bony tenderness or snuff box tenderness. Normal range of motion. Normal pulse.     Left wrist: No tenderness, bony tenderness or snuff box tenderness. Normal range of motion. Normal pulse.     Right  hand: No bony tenderness. Normal range of motion. Normal sensation. Normal capillary refill.     Left hand: No bony tenderness. Normal range of motion. Decreased sensation of the median distribution. Normal sensation of the ulnar distribution and radial distribution. Normal capillary refill.     Cervical back: Normal range of motion and neck supple. No tenderness or bony tenderness.  Skin:    General: Skin is warm and dry.  Neurological:     Mental Status: He is alert and oriented to person, place, and time.     GCS: GCS eye subscore is 4. GCS verbal subscore is 5. GCS motor subscore is 6.     Cranial Nerves: No cranial nerve deficit.     Sensory: No sensory deficit.     Motor: No abnormal muscle tone.     Coordination: Coordination normal.     Gait: Gait normal.     Deep Tendon Reflexes: Reflexes are normal and symmetric.     Comments: Patient reports decreased sensation on the plantar and volar aspect of left thumb, index, middle finger and palm.      ED Results / Procedures / Treatments   Labs (all labs ordered are listed, but only abnormal results are displayed) Labs Reviewed  CBC WITH DIFFERENTIAL/PLATELET - Abnormal; Notable for the following components:      Result Value   HCT 38.7 (*)    Lymphs Abs 5.4 (*)    All other components within normal limits  BASIC METABOLIC PANEL - Abnormal; Notable for the following components:   Potassium 3.0 (*)    Glucose, Bld 111 (*)    All other components within normal limits    EKG None  Radiology No results found.  Procedures Procedures (including critical care time)  Medications Ordered in ED Medications  potassium chloride SA (KLOR-CON) CR tablet 40 mEq (40 mEq Oral Given 04/17/20 1131)    ED Course  I have reviewed the triage vital signs and  the nursing notes.  Pertinent labs & imaging results that were available during my care of the patient were reviewed by me and considered in my medical decision making (see chart for  details).  Patient seen and examined.  Reviewed work-up ordered at triage.  Potassium was low at 3.0.  Repletion ordered.  Vital signs reviewed and are as follows: BP (!) 154/75 (BP Location: Right Arm)   Pulse (!) 47   Temp 98.5 F (36.9 C) (Oral)   Resp 15   Ht _0  (1.727 m)   Wt 78 kg   SpO2 100%   BMI 26.15 kg/m   Overall, patient has a reassuring neuro exam.  He reports tingling and decreased sensation in the left hand and mostly a median nerve distribution although he does have some deficit on the dorsum as well.  No ulnar nerve involvement.  No forearm or wrist involvement at the current time.  He has good strength.  He will be provided with a Velcro wrist splint for stability.  Will avoid systemic steroids and NSAIDs at this time given patient's history of diabetes and coronary artery disease.  Patient would benefit from PCP and/or orthopedic follow-up.  Discussed possible etiologies of his symptoms including carpal tunnel syndrome, however this would not explain the sensations he gets in his upper arm.  We also discussed cervical radiculopathy as a cause of his symptoms.  Encouraged rest, avoidance of activities that exacerbate symptoms, and close follow-up.  At this point, I do not feel that the patient is having a stroke.  He is not having any significant weakness.  Decreased sensation is in a peripheral nerve distribution and recent history of new job with repetitive activities and motions of the upper extremities, wrist, and hand make a central cause less likely.  Patient does have some risk factors, however I do not feel that the patient requires systemic work-up for CVA at this time.  Patient counseled to return if they have weakness in their arms or legs, slurred speech, trouble walking or talking, confusion, trouble with their balance, or if they have any other concerns. Patient verbalizes understanding and agrees with plan.      MDM Rules/Calculators/A&P                            Final Clinical Impression(s) / ED Diagnoses Final diagnoses:  Paresthesia of left arm    Rx / DC Orders ED Discharge Orders    None       Carlisle Cater, PA-C 04/17/20 1141    Tegeler, Gwenyth Allegra, MD 04/17/20 1640

## 2020-05-05 MED FILL — OMEPRAZOLE DR 40 MG CAPSULE: 40 | 30 days supply | Qty: 30 | Fill #0

## 2020-05-05 MED FILL — HYDROCHLOROTHIAZIDE 25 MG T: 25 | 30 days supply | Qty: 30 | Fill #2

## 2020-05-13 MED FILL — OMEPRAZOLE DR 40 MG CAPSULE: 40 | 30 days supply | Qty: 30 | Fill #0

## 2020-05-13 MED FILL — HYDROCHLOROTHIAZIDE 25 MG T: 25 | 30 days supply | Qty: 30 | Fill #2

## 2020-06-05 ENCOUNTER — Other Ambulatory Visit: Payer: Self-pay | Admitting: Nurse Practitioner

## 2020-06-05 DIAGNOSIS — E1142 Type 2 diabetes mellitus with diabetic polyneuropathy: Secondary | ICD-10-CM

## 2020-06-05 MED FILL — ATORVASTATIN 80 MG TABLET: 80 | 30 days supply | Qty: 30 | Fill #2

## 2020-06-05 MED FILL — metFORMIN HCL 1000 MG TABS: 1000 | 30 days supply | Qty: 60 | Fill #2

## 2020-06-05 MED FILL — LISINOPRIL 40 MG TABLET: 40 | 30 days supply | Qty: 30 | Fill #2

## 2020-06-05 MED FILL — ?METOPROLOL SUCC ER 25MG TA: 25 | 30 days supply | Qty: 30 | Fill #3

## 2020-06-05 MED FILL — AMLODIPINE BESYLATE 10 MG T: 10 | 30 days supply | Qty: 30 | Fill #2

## 2020-06-05 MED FILL — GABAPENTIN 300 MG CAPSULE: 300 | 30 days supply | Qty: 90 | Fill #0

## 2020-07-11 ENCOUNTER — Other Ambulatory Visit: Payer: Self-pay | Admitting: Nurse Practitioner

## 2020-07-11 DIAGNOSIS — I10 Essential (primary) hypertension: Secondary | ICD-10-CM

## 2020-07-11 MED FILL — METOPROLOL SUCCINATE ER 25: 25 | 30 days supply | Qty: 30 | Fill #4

## 2020-07-11 MED FILL — OMEPRAZOLE DR 40 MG CAPSULE: 40 | 30 days supply | Qty: 30 | Fill #1

## 2020-07-11 MED FILL — HYDROCHLOROTHIAZIDE 25 MG T: 25 | 30 days supply | Qty: 30 | Fill #3

## 2020-07-11 NOTE — Telephone Encounter (Signed)
Requested medication (s) are due for refill today: yes  Requested medication (s) are on the active medication list:  yes  Last refill:  06/05/2020  Future visit scheduled:no  Notes to clinic: overdue for follow up appointment   Requested Prescriptions  Pending Prescriptions Disp Refills   lisinopril (ZESTRIL) 40 MG tablet [Pharmacy Med Name: LISINOPRIL 40 MG TABLET 40 Tablet] 30 tablet 0    Sig: Take 1 tablet (40 mg total) by mouth daily.      Cardiovascular:  ACE Inhibitors Failed - 07/11/2020  9:36 AM      Failed - K in normal range and within 180 days    Potassium  Date Value Ref Range Status  04/17/2020 3.0 (L) 3.5 - 5.1 mmol/L Final          Failed - Last BP in normal range    BP Readings from Last 1 Encounters:  04/17/20 (!) 154/75          Failed - Valid encounter within last 6 months    Recent Outpatient Visits           8 months ago Diabetes mellitus type 2, uncontrolled, with complications [facility])   [place] [place], Shea Stakes, NP   11 months ago Essential hypertension   [place] [address] [facility] [place], [place] W, NP   1 year ago Essential hypertension   [facility] [place], Shea Stakes, NP   1 year ago Essential hypertension   [facility] [place], Shea Stakes, NP   1 year ago Uncontrolled type 2 diabetes mellitus without complication, without long-term current use of insulin (HCC)   [facility] [place], [place] W, NP              Passed - Cr in normal range and within 180 days    Creat  Date Value Ref Range Status  09/28/2016 1.02 0.60 - 1.35 mg/dL Final   Creatinine, Ser  Date Value Ref Range Status  04/17/2020 0.96 0.61 - 1.24 mg/dL Final   Creatinine,U  Date Value Ref Range Status  12/21/2012 93.0 mg/dL Final    Comment:    (NOTE) Cutoff Values for Urine Drug Screen:        Drug Class           Cutoff (ng/mL)         Amphetamines            1000        Barbiturates             200        Cocaine Metabolites      300        Benzodiazepines          200        Methadone                300        Opiates                 2000        Phencyclidine             25        Propoxyphene             300        Marijuana Metabolites     50 For medical purposes only.  Passed - Patient is not pregnant

## 2020-07-14 MED FILL — LISINOPRIL 40 MG TABLET: 40 | 30 days supply | Qty: 30 | Fill #0

## 2020-08-05 MED FILL — METOPROLOL SUCCINATE ER 25: 25 | 30 days supply | Qty: 30 | Fill #5

## 2020-08-12 ENCOUNTER — Other Ambulatory Visit: Payer: Self-pay | Admitting: Nurse Practitioner

## 2020-08-12 DIAGNOSIS — I10 Essential (primary) hypertension: Secondary | ICD-10-CM

## 2020-08-12 MED FILL — GABAPENTIN 300 MG CAPSULE: 300 | 30 days supply | Qty: 90 | Fill #1

## 2020-08-12 MED FILL — METFORMIN HCL 1000 MG TABS: 1000 | 30 days supply | Qty: 60 | Fill #3

## 2020-08-12 MED FILL — HYDROCHLOROTHIAZIDE 25 MG T: 25 | 30 days supply | Qty: 30 | Fill #4

## 2020-08-12 NOTE — Telephone Encounter (Signed)
Requested medication (s) are due for refill today:yes  Requested medication (s) are on the active medication listyes  Last refill: 07/11/20  #30  0 refills   Future visit scheduled:yes tomorrow with Dr Delford Field  Notes to clinic:  last fill states patient must have OV for next refill.  Scheduled with Dr Delford Field tomorrow.    Requested Prescriptions  Pending Prescriptions Disp Refills   lisinopril (ZESTRIL) 40 MG tablet [Pharmacy Med Name: LISINOPRIL 40 MG TABLET 40 Tablet] 30 tablet 0    Sig: TAKE 1 TABLET (40 MG TOTAL) BY MOUTH DAILY. MUST HAVE OFFICE VISIT FOR REFILLS      Cardiovascular:  ACE Inhibitors Failed - 08/12/2020 11:44 AM      Failed - K in normal range and within 180 days    Potassium  Date Value Ref Range Status  04/17/2020 3.0 (L) 3.5 - 5.1 mmol/L Final          Failed - Last BP in normal range    BP Readings from Last 1 Encounters:  04/17/20 (!) 154/75          Failed - Valid encounter within last 6 months    Recent Outpatient Visits           9 months ago Diabetes mellitus type 2, uncontrolled, with complications [facility])   [place] [facility] [place], Shea Stakes, NP   1 year ago Essential hypertension   [place] [facility] [place], Shea Stakes, NP   1 year ago Essential hypertension   [place] [facility] [place], Shea Stakes, NP   1 year ago Essential hypertension   [place] [facility] [place], [place] W, NP   1 year ago Uncontrolled type 2 diabetes mellitus without complication, without long-term current use of insulin [facility])    [facility] [place], Shea Stakes, NP       Future Appointments             Tomorrow Storm Frisk, MD [facility]            Passed - Cr in normal range and within 180 days    Creat  Date Value Ref Range Status  09/28/2016 1.02 0.60 - 1.35 mg/dL Final   Creatinine, Ser  Date Value Ref  Range Status  04/17/2020 0.96 0.61 - 1.24 mg/dL Final   Creatinine,U  Date Value Ref Range Status  12/21/2012 93.0 mg/dL Final    Comment:    (NOTE) Cutoff Values for Urine Drug Screen:        Drug Class           Cutoff (ng/mL)        Amphetamines            1000        Barbiturates             200        Cocaine Metabolites      300        Benzodiazepines          200        Methadone                300        Opiates                 2000        Phencyclidine  25        Propoxyphene             300        Marijuana Metabolites     50 For medical purposes only.          Passed - Patient is not pregnant

## 2020-08-13 ENCOUNTER — Ambulatory Visit (HOSPITAL_BASED_OUTPATIENT_CLINIC_OR_DEPARTMENT_OTHER): Payer: Self-pay | Admitting: Pharmacist

## 2020-08-13 ENCOUNTER — Other Ambulatory Visit: Payer: Self-pay

## 2020-08-13 ENCOUNTER — Ambulatory Visit: Payer: Self-pay | Attending: Critical Care Medicine | Admitting: Critical Care Medicine

## 2020-08-13 ENCOUNTER — Encounter: Payer: Self-pay | Admitting: Critical Care Medicine

## 2020-08-13 ENCOUNTER — Other Ambulatory Visit: Payer: Self-pay | Admitting: Critical Care Medicine

## 2020-08-13 VITALS — BP 147/67 | HR 51 | Resp 16 | Wt 175.0 lb

## 2020-08-13 DIAGNOSIS — Z1159 Encounter for screening for other viral diseases: Secondary | ICD-10-CM

## 2020-08-13 DIAGNOSIS — E1142 Type 2 diabetes mellitus with diabetic polyneuropathy: Secondary | ICD-10-CM

## 2020-08-13 DIAGNOSIS — Z1211 Encounter for screening for malignant neoplasm of colon: Secondary | ICD-10-CM

## 2020-08-13 DIAGNOSIS — Z23 Encounter for immunization: Secondary | ICD-10-CM

## 2020-08-13 DIAGNOSIS — IMO0002 Reserved for concepts with insufficient information to code with codable children: Secondary | ICD-10-CM

## 2020-08-13 DIAGNOSIS — E785 Hyperlipidemia, unspecified: Secondary | ICD-10-CM

## 2020-08-13 DIAGNOSIS — K219 Gastro-esophageal reflux disease without esophagitis: Secondary | ICD-10-CM

## 2020-08-13 DIAGNOSIS — I25118 Atherosclerotic heart disease of native coronary artery with other forms of angina pectoris: Secondary | ICD-10-CM

## 2020-08-13 DIAGNOSIS — E782 Mixed hyperlipidemia: Secondary | ICD-10-CM

## 2020-08-13 DIAGNOSIS — I10 Essential (primary) hypertension: Secondary | ICD-10-CM

## 2020-08-13 DIAGNOSIS — E1165 Type 2 diabetes mellitus with hyperglycemia: Secondary | ICD-10-CM

## 2020-08-13 DIAGNOSIS — Z111 Encounter for screening for respiratory tuberculosis: Secondary | ICD-10-CM

## 2020-08-13 DIAGNOSIS — L409 Psoriasis, unspecified: Secondary | ICD-10-CM

## 2020-08-13 DIAGNOSIS — E118 Type 2 diabetes mellitus with unspecified complications: Secondary | ICD-10-CM

## 2020-08-13 LAB — POCT GLYCOSYLATED HEMOGLOBIN (HGB A1C): HbA1c, POC (controlled diabetic range): 7 % (ref 0.0–7.0)

## 2020-08-13 LAB — GLUCOSE, POCT (MANUAL RESULT ENTRY): POC Glucose: 99 mg/dl (ref 70–99)

## 2020-08-13 MED ORDER — LISINOPRIL 40 MG PO TABS: 40.0000 mg | ORAL_TABLET | Freq: Every day | ORAL | 1 refills | Status: DC

## 2020-08-13 MED ORDER — GLUCOSE BLOOD VI STRP: ORAL_STRIP | 12 refills | Status: AC

## 2020-08-13 MED ORDER — BETAMETHASONE DIPROPIONATE 0.05 % EX CREA: TOPICAL_CREAM | CUTANEOUS | 1 refills | Status: DC

## 2020-08-13 MED ORDER — GABAPENTIN 300 MG PO CAPS: 300.0000 mg | ORAL_CAPSULE | Freq: Three times a day (TID) | ORAL | 2 refills | Status: DC

## 2020-08-13 MED ORDER — METFORMIN HCL 1000 MG PO TABS: 1000.0000 mg | ORAL_TABLET | Freq: Two times a day (BID) | ORAL | 1 refills | Status: DC

## 2020-08-13 MED ORDER — AMLODIPINE BESYLATE 10 MG PO TABS: 10.0000 mg | ORAL_TABLET | Freq: Every day | ORAL | 0 refills | Status: DC

## 2020-08-13 MED ORDER — OMEPRAZOLE 40 MG PO CPDR: 40.0000 mg | DELAYED_RELEASE_CAPSULE | Freq: Every day | ORAL | 2 refills | Status: DC

## 2020-08-13 MED ORDER — TRUEPLUS LANCETS 26G MISC: 1.0000 | Freq: Three times a day (TID) | 12 refills | Status: AC | PRN

## 2020-08-13 MED ORDER — HYDROCHLOROTHIAZIDE 25 MG PO TABS: 25.0000 mg | ORAL_TABLET | Freq: Every day | ORAL | 3 refills | Status: DC

## 2020-08-13 MED ORDER — METOPROLOL SUCCINATE ER 25 MG PO TB24: 25.0000 mg | ORAL_TABLET | Freq: Every day | ORAL | 2 refills | Status: DC

## 2020-08-13 MED ORDER — ATORVASTATIN CALCIUM 80 MG PO TABS: 80.0000 mg | ORAL_TABLET | Freq: Every day | ORAL | 2 refills | Status: DC

## 2020-08-13 MED FILL — ATORVASTATIN CALCIUM 80 MG: 80 | 30 days supply | Qty: 30 | Fill #0

## 2020-08-13 MED FILL — TRUEplus LANCETS 28G MISC: 33 days supply | Qty: 100 | Fill #0

## 2020-08-13 MED FILL — BETAMETHASONE DP 0.05% CRM: 0.05 | 22 days supply | Qty: 45 | Fill #0

## 2020-08-13 MED FILL — OMEPRAZOLE DR 40 MG CAPSULE: 40 | 30 days supply | Qty: 30 | Fill #0

## 2020-08-13 MED FILL — AMLODIPINE BESYLATE 10 MG T: 10 | 30 days supply | Qty: 30 | Fill #0

## 2020-08-13 MED FILL — LISINOPRIL 40 MG TABLET: 40 | 30 days supply | Qty: 30 | Fill #0

## 2020-08-13 NOTE — Assessment & Plan Note (Addendum)
Diffuse psoriasis  I am going to screen the patient for hepatitis AB and QuantiFERON gold for TB and see if he can be a candidate for potential Humira treatment  He does not have insurance I cannot refer him to dermatology and he is not been able to achieve the orange card I will refill the topical steroid for now

## 2020-08-13 NOTE — Progress Notes (Signed)
Subjective:    Patient ID: Bradley Edman., male    DOB: 16-Aug-1971, 49 y.o.   MRN: [id number]  49 y.o.M here for med refills.  Hx CAD, NSTEMI, HTN, GERD, T2DM poorly controlled, Psoriasis, HLD, tobacco use.  Not seen in Clinic with PCP Raul Del since 10/2019  .08/13/2020 This patient has been running out of his blood pressure medicine for some time now and it takes it only intermittently.  He did receive some short-term supplies but only took them as needed.  Here rise today with a blood pressure 147/67.  He has not been seen in this clinic since January 2021.  He currently is not smoking does not drink alcohol.  He complains of bilateral foot pain.  On arrival blood sugar is 99.  He works third shift and often is unable to keep medical appointments because he sleeping during the daytime  Patient has no other complaints  The patient does have chronic plaque psoriasis unimproved with topical steroids and cold tar application he has not seen a dermatologist does not have insurance is not able to achieve the orange card due to multiple failed application attempts  Past Medical History:  Diagnosis Date  . CAD (coronary artery disease), native coronary artery 12/21/12   a. NSTEMI 2/14 => LHC 12/21/12: mLAD 90%, OM1 20%, mRCA 100%, EF 55-60%.  PCI: Xience Xpedition DES to mRCA and Xience Xpedition DES to mLAD.   . Diabetes mellitus without complication ([place])   . GERD (gastroesophageal reflux disease)   . Hyperlipidemia   . Hypertension   . NSTEMI   . Psoriasis   . Tobacco abuse      Family History  Problem Relation Age of Onset  . Heart attack Father   . Heart attack Mother      Social History   Socioeconomic History  . Marital status: Single    Spouse name: Not on file  . Number of children: Not on file  . Years of education: Not on file  . Highest education level: Not on file  Occupational History  . Not on file  Tobacco Use  . Smoking status: Former Smoker     Packs/day: 0.25    Years: 20.00    Pack years: 5.00    Types: Cigarettes    Quit date: 02/20/2016    Years since quitting: 4.4  . Smokeless tobacco: Never Used  . Tobacco comment: Smoke [place] OCC.   Vaping Use  . Vaping Use: Never used  Substance and Sexual Activity  . Alcohol use: Yes    Alcohol/week: 1.0 standard drink    Types: 1 Standard drinks or equivalent per week    Comment: rarely   . Drug use: Yes    Types: Marijuana  . Sexual activity: Not on file  Other Topics Concern  . Not on file  Social History Narrative  . Not on file   Social Determinants of Health   Financial Resource Strain:   . Difficulty of Paying Living Expenses: Not on file  Food Insecurity:   . Worried About Charity fundraiser in the Last Year: Not on file  . Ran Out of Food in the Last Year: Not on file  Transportation Needs:   . Lack of Transportation (Medical): Not on file  . Lack of Transportation (Non-Medical): Not on file  Physical Activity:   . Days of Exercise per Week: Not on file  . Minutes of Exercise per Session: Not on file  Stress:   .  Feeling of Stress : Not on file  Social Connections:   . Frequency of Communication with Friends and Family: Not on file  . Frequency of Social Gatherings with Friends and Family: Not on file  . Attends Religious Services: Not on file  . Active Member of Clubs or Organizations: Not on file  . Attends Archivist Meetings: Not on file  . Marital Status: Not on file  Intimate Partner Violence:   . Fear of Current or Ex-Partner: Not on file  . Emotionally Abused: Not on file  . Physically Abused: Not on file  . Sexually Abused: Not on file     No Known Allergies   Outpatient Medications Prior to Visit  Medication Sig Dispense Refill  . acetaminophen (TYLENOL) 500 MG tablet Take 2 tablets (1,000 mg total) by mouth every 8 (eight) hours as needed for moderate pain. 30 tablet 0  . albuterol (VENTOLIN HFA) 108 (90 Base) MCG/ACT  inhaler Inhale 2 puffs into the lungs every 6 (six) hours as needed for wheezing or shortness of breath. 18 g 1  . Blood Glucose Monitoring Suppl (TRUE METRIX METER) w/Device KIT Needs new meter. We tested the his current one with a new battery and it is defective. Patient will pick up scripts today. 1 kit 0  . Coal Tar Extract (PSORIASIN EX) Apply 1 application topically as needed (for psoriasis).    . nitroGLYCERIN (NITROSTAT) 0.4 MG SL tablet DISSOLVE ONE TABLET UNDER THE TONGUE EVERY 5 MINUTES AS NEEDED FOR CHEST PAIN. DO NOT EXCEED A TOTAL OF 3 DOSES IN 15 MINUTES 25 tablet 1  . betamethasone dipropionate 0.05 % cream APPLY TOPICALLY TWO TIMES DAILY. PATIENT WILL PICK UP SCRIPTS TODAY. 45 g 1  . glucose blood test strip Use as instructed 100 each 12  . hydrochlorothiazide (HYDRODIURIL) 25 MG tablet Take 1 tablet (25 mg total) by mouth daily. 90 tablet 3  . lisinopril (ZESTRIL) 40 MG tablet Take 1 tablet (40 mg total) by mouth daily. Must have office visit for refills 30 tablet 0  . TRUEplus Lancets 26G MISC 1 each by Does not apply route every 8 (eight) hours as needed. 100 each 12  . amLODipine (NORVASC) 10 MG tablet Take 1 tablet (10 mg total) by mouth daily. 90 tablet 0  . atorvastatin (LIPITOR) 80 MG tablet Take 1 tablet (80 mg total) by mouth daily. 90 tablet 0  . gabapentin (NEURONTIN) 300 MG capsule TAKE 1 CAPSULE (300 MG TOTAL) BY MOUTH 3 (THREE) TIMES DAILY. 90 capsule 2  . metFORMIN (GLUCOPHAGE) 1000 MG tablet Take 1 tablet (1,000 mg total) by mouth 2 (two) times daily with a meal. 180 tablet 1  . metoprolol succinate (TOPROL-XL) 25 MG 24 hr tablet Take 1 tablet (25 mg total) by mouth daily. 90 tablet 2  . omeprazole (PRILOSEC) 40 MG capsule Take 1 capsule (40 mg total) by mouth daily. Will pick up today. 90 capsule 2   No facility-administered medications prior to visit.      Review of Systems  HENT: Negative.   Respiratory: Negative.   Cardiovascular: Negative.     Gastrointestinal: Negative.   Genitourinary: Negative.   Musculoskeletal:       Foot pain  Skin: Positive for rash.  Neurological: Negative.   Psychiatric/Behavioral: Negative.        Objective:   Physical Exam Vitals:   08/13/20 1010  BP: (!) 147/67  Pulse: (!) 51  Resp: 16  SpO2: 100%  Weight: 175 lb (  79.4 kg)    Gen: Pleasant, well-nourished, in no distress,  normal affect  ENT: No lesions,  mouth clear,  oropharynx clear, no postnasal drip  Neck: No JVD, no TMG, no carotid bruits  Lungs: No use of accessory muscles, no dullness to percussion, clear without rales or rhonchi  Cardiovascular: RRR, heart sounds normal, no murmur or gallops, no peripheral edema  Abdomen: soft and NT, no HSM,  BS normal  Musculoskeletal: No deformities, no cyanosis or clubbing  Neuro: alert, non focal  Skin: Warm, diffuse psoriatic plaque like lesions seen on arms chest neck lower extremities consistent with severe psoriasis  Foot exam there is a callus underneath the fifth toe of the right foot sensation is normal toenail fungus noted No results found.        Assessment & Plan:  I personally reviewed all images and lab data in the [facility] system as well as any outside material available during this office visit and agree with the  radiology impressions.   Psoriasis Diffuse psoriasis  I am going to screen the patient for hepatitis AB and QuantiFERON gold for TB and see if he can be a candidate for potential Humira treatment  He does not have insurance I cannot refer him to dermatology and he is not been able to achieve the orange card I will refill the topical steroid for now   Type 2 diabetes mellitus ([place]) Type 2 diabetes with improved control  Hemoglobin A1c of 7 today we will continue Metformin  Hyperlipidemia Continue atorvastatin as prescribed  CAD (coronary artery disease), native coronary artery History of NSTEMI with coronary artery disease continue current  medications follow-up with cardiology  Essential hypertension Hypertension well controlled continue current plan   [place] was seen today for medication refill.  Diagnoses and all orders for this visit:  Type 2 diabetes mellitus with hyperglycemia, without long-term current use of insulin (HCC)  Diabetes mellitus type 2, uncontrolled, with complications (HCC) -     POCT glucose (manual entry) -     POCT glycosylated hemoglobin (Hb A1C) -     glucose blood test strip; Use as instructed -     TRUEplus Lancets 26G MISC; 1 each by Does not apply route every 8 (eight) hours as needed. -     metFORMIN (GLUCOPHAGE) 1000 MG tablet; Take 1 tablet (1,000 mg total) by mouth 2 (two) times daily with a meal. -     Basic metabolic panel  Psoriasis -     betamethasone dipropionate 0.05 % cream; APPLY TOPICALLY TWO TIMES DAILY. PATIENT WILL PICK UP SCRIPTS TODAY.  Essential hypertension -     amLODipine (NORVASC) 10 MG tablet; Take 1 tablet (10 mg total) by mouth daily. -     hydrochlorothiazide (HYDRODIURIL) 25 MG tablet; Take 1 tablet (25 mg total) by mouth daily. -     metoprolol succinate (TOPROL-XL) 25 MG 24 hr tablet; Take 1 tablet (25 mg total) by mouth daily. -     lisinopril (ZESTRIL) 40 MG tablet; Take 1 tablet (40 mg total) by mouth daily. Must have office visit for refills  Diabetic polyneuropathy associated with type 2 diabetes mellitus ([place]) -     gabapentin (NEURONTIN) 300 MG capsule; Take 1 capsule (300 mg total) by mouth 3 (three) times daily.  GERD without esophagitis -     omeprazole (PRILOSEC) 40 MG capsule; Take 1 capsule (40 mg total) by mouth daily. Will pick up today.  Hyperlipidemia LDL goal <70 -  atorvastatin (LIPITOR) 80 MG tablet; Take 1 tablet (80 mg total) by mouth daily.  Need for hepatitis C screening test -     HCV Ab w/Rflx to Verification  Colon cancer screening -     Fecal occult blood, imunochemical  Need for hepatitis B screening test -     Hep B  Core Ab W/Reflex  Screening-pulmonary TB -     QuantiFERON-TB Gold Plus  Moderate mixed hyperlipidemia not requiring statin therapy  Coronary artery disease involving native coronary artery of native heart with other form of angina pectoris (HCC)  A fit test will be given to the patient for colon cancer screening and an HCV study will be obtained

## 2020-08-13 NOTE — Assessment & Plan Note (Signed)
Type 2 diabetes with improved control  Hemoglobin A1c of 7 today we will continue Metformin

## 2020-08-13 NOTE — Progress Notes (Signed)
Patient presents for vaccination against influenza per orders of Dr. Wright. Consent given. Counseling provided. No contraindications exists. Vaccine administered without incident.   Luke Van Ausdall, PharmD, CPP Clinical Pharmacist [facility] [phone]   

## 2020-08-13 NOTE — Assessment & Plan Note (Signed)
History of NSTEMI with coronary artery disease continue current medications follow-up with cardiology

## 2020-08-13 NOTE — Assessment & Plan Note (Signed)
Continue atorvastatin as prescribed 

## 2020-08-13 NOTE — Assessment & Plan Note (Signed)
Hypertension well controlled continue current plan

## 2020-08-13 NOTE — Patient Instructions (Addendum)
A flu vaccine was given  Please get a pfizer covid booster shot some time in next 4-6 weeks  Refills on all your medications was sent to our pharmacy  Labs today: screening for TB, Hep C, HepB metabolic panel  Pick up hand and foot cream  Will refer to podiatry once you have insurance  A colon cancer screen kit will be given  I will speak to Dr Raul Del regarding possible humira treatment for psorasis    Return Dr Raul Del  2 months  This is the medication I would consider giving you: Adalimumab Injection What is this medicine? ADALIMUMAB (a dal AYE mu mab) is used to treat rheumatoid and psoriatic arthritis. It is also used to treat ankylosing spondylitis, Crohn's disease, ulcerative colitis, plaque psoriasis, hidradenitis suppurativa, and uveitis. This medicine may be used for other purposes; ask your health care provider or pharmacist if you have questions. COMMON BRAND NAME(S): CYLTEZO, Humira What should I tell my health care provider before I take this medicine? They need to know if you have any of these conditions:  diabetes  heart disease  hepatitis B or history of hepatitis B infection  immune system problems  infection or history of infections  multiple sclerosis  recently received or scheduled to receive a vaccine  scheduled to have surgery  tuberculosis, a positive skin test for tuberculosis or have recently been in close contact with someone who has tuberculosis  an unusual reaction to adalimumab, other medicines, mannitol, latex, rubber, foods, dyes, or preservatives  pregnant or trying to get pregnant  breast-feeding How should I use this medicine? This medicine is for injection under the skin. You will be taught how to prepare and give this medicine. Use exactly as directed. Take your medicine at regular intervals. Do not take your medicine more often than directed. A special MedGuide will be given to you by the pharmacist with each prescription and  refill. Be sure to read this information carefully each time. It is important that you put your used needles and syringes in a special sharps container. Do not put them in a trash can. If you do not have a sharps container, call your pharmacist or healthcare provider to get one. Talk to your pediatrician regarding the use of this medicine in children. While this drug may be prescribed for children as young as 2 years for selected conditions, precautions do apply. The manufacturer of the medicine offers free information to patients and their health care partners. Call [phone] for more information. Overdosage: If you think you have taken too much of this medicine contact a poison control center or emergency room at once. NOTE: This medicine is only for you. Do not share this medicine with others. What if I miss a dose? If you miss a dose, take it as soon as you can. If it is almost time for your next dose, take only that dose. Do not take double or extra doses. Give the next dose when your next scheduled dose is due. Call your doctor or health care professional if you are not sure how to handle a missed dose. What may interact with this medicine? Do not take this medicine with any of the following medications:  abatacept  anakinra  etanercept  infliximab  live virus vaccines  rilonacept This medicine may also interact with the following medications:  vaccines This list may not describe all possible interactions. Give your health care provider a list of all the medicines, herbs, non-prescription drugs, or dietary  supplements you use. Also tell them if you smoke, drink alcohol, or use illegal drugs. Some items may interact with your medicine. What should I watch for while using this medicine? Visit your doctor or health care professional for regular checks on your progress. Tell your doctor or healthcare professional if your symptoms do not start to get better or if they get worse. You  will be tested for tuberculosis (TB) before you start this medicine. If your doctor prescribes any medicine for TB, you should start taking the TB medicine before starting this medicine. Make sure to finish the full course of TB medicine. Call your doctor or health care professional if you get a cold or other infection while receiving this medicine. Do not treat yourself. This medicine may decrease your body's ability to fight infection. Talk to your doctor about your risk of cancer. You may be more at risk for certain types of cancers if you take this medicine. What side effects may I notice from receiving this medicine? Side effects that you should report to your doctor or health care professional as soon as possible:  allergic reactions like skin rash, itching or hives, swelling of the face, lips, or tongue  breathing problems  changes in vision  chest pain  fever, chills, or any other sign of infection  numbness or tingling  red, scaly patches or raised bumps on the skin  swelling of the ankles  swollen lymph nodes in the neck, underarm, or groin areas  unexplained weight loss  unusual bleeding or bruising  unusually weak or tired Side effects that usually do not require medical attention (report to your doctor or health care professional if they continue or are bothersome):  headache  nausea  redness, itching, swelling, or bruising at site where injected This list may not describe all possible side effects. Call your doctor for medical advice about side effects. You may report side effects to FDA at 1-800-FDA-1088. Where should I keep my medicine? Keep out of the reach of children. Store in the original container and in the refrigerator between 2 and 8 degrees C (36 and 46 degrees F). Do not freeze. The product may be stored in a cool carrier with an ice pack, if needed. Protect from light. Throw away any unused medicine after the expiration date. NOTE: This sheet is a  summary. It may not cover all possible information. If you have questions about this medicine, talk to your doctor, pharmacist, or health care provider.  2020 Elsevier/Gold Standard (2018-07-31 13:22:46)     Influenza Virus Vaccine injection (Fluarix) What is this medicine? INFLUENZA VIRUS VACCINE (in floo EN zuh VAHY ruhs vak SEEN) helps to reduce the risk of getting influenza also known as the flu. This medicine may be used for other purposes; ask your health care provider or pharmacist if you have questions. COMMON BRAND NAME(S): Fluarix, Fluzone What should I tell my health care provider before I take this medicine? They need to know if you have any of these conditions:  bleeding disorder like hemophilia  fever or infection  Guillain-Barre syndrome or other neurological problems  immune system problems  infection with the human immunodeficiency virus (HIV) or AIDS  low blood platelet counts  multiple sclerosis  an unusual or allergic reaction to influenza virus vaccine, eggs, chicken proteins, latex, gentamicin, other medicines, foods, dyes or preservatives  pregnant or trying to get pregnant  breast-feeding How should I use this medicine? This vaccine is for injection into a muscle.  It is given by a health care professional. A copy of Vaccine Information Statements will be given before each vaccination. Read this sheet carefully each time. The sheet may change frequently. Talk to your pediatrician regarding the use of this medicine in children. Special care may be needed. Overdosage: If you think you have taken too much of this medicine contact a poison control center or emergency room at once. NOTE: This medicine is only for you. Do not share this medicine with others. What if I miss a dose? This does not apply. What may interact with this medicine?  chemotherapy or radiation therapy  medicines that lower your immune system like etanercept, anakinra, infliximab,  and adalimumab  medicines that treat or prevent blood clots like warfarin  phenytoin  steroid medicines like prednisone or cortisone  theophylline  vaccines This list may not describe all possible interactions. Give your health care provider a list of all the medicines, herbs, non-prescription drugs, or dietary supplements you use. Also tell them if you smoke, drink alcohol, or use illegal drugs. Some items may interact with your medicine. What should I watch for while using this medicine? Report any side effects that do not go away within 3 days to your doctor or health care professional. Call your health care provider if any unusual symptoms occur within 6 weeks of receiving this vaccine. You may still catch the flu, but the illness is not usually as bad. You cannot get the flu from the vaccine. The vaccine will not protect against colds or other illnesses that may cause fever. The vaccine is needed every year. What side effects may I notice from receiving this medicine? Side effects that you should report to your doctor or health care professional as soon as possible:  allergic reactions like skin rash, itching or hives, swelling of the face, lips, or tongue Side effects that usually do not require medical attention (report to your doctor or health care professional if they continue or are bothersome):  fever  headache  muscle aches and pains  pain, tenderness, redness, or swelling at site where injected  weak or tired This list may not describe all possible side effects. Call your doctor for medical advice about side effects. You may report side effects to FDA at 1-800-FDA-1088. Where should I keep my medicine? This vaccine is only given in a clinic, pharmacy, doctor's office, or other health care setting and will not be stored at home. NOTE: This sheet is a summary. It may not cover all possible information. If you have questions about this medicine, talk to your doctor,  pharmacist, or health care provider.  2020 Elsevier/Gold Standard (2008-05-08 09:30:40)

## 2020-08-14 NOTE — Progress Notes (Signed)
Normal result letter generated and mailed to address on file. 

## 2020-08-16 LAB — BASIC METABOLIC PANEL
BUN/Creatinine Ratio: 14 (ref 9–20)
BUN: 12 mg/dL (ref 6–24)
CO2: 25 mmol/L (ref 20–29)
Calcium: 9.1 mg/dL (ref 8.7–10.2)
Chloride: 103 mmol/L (ref 96–106)
Creatinine, Ser: 0.83 mg/dL (ref 0.76–1.27)
GFR calc Af Amer: 119 mL/min/{1.73_m2} (ref >59)
GFR calc non Af Amer: 103 mL/min/{1.73_m2} (ref >59)
Glucose: 89 mg/dL (ref 65–99)
Potassium: 4.2 mmol/L (ref 3.5–5.2)
Sodium: 139 mmol/L (ref 134–144)

## 2020-08-16 LAB — QUANTIFERON-TB GOLD PLUS
QuantiFERON Mitogen Value: >10 IU/mL
QuantiFERON Nil Value: 0.01 IU/mL
QuantiFERON TB1 Ag Value: 0 IU/mL
QuantiFERON TB2 Ag Value: 0.02 IU/mL
QuantiFERON-TB Gold Plus: NEGATIVE

## 2020-08-16 LAB — HCV AB W/RFLX TO VERIFICATION: HCV Ab: <0.1 s/co ratio (ref 0.0–0.9)

## 2020-08-16 LAB — HCV INTERPRETATION

## 2020-08-16 LAB — HEPATITIS B CORE AB W/REFLEX: Hep B Core Total Ab: NEGATIVE

## 2020-08-16 LAB — FECAL OCCULT BLOOD, IMMUNOCHEMICAL: Fecal Occult Bld: NEGATIVE

## 2020-08-18 NOTE — Progress Notes (Signed)
Normal result letter generated and mailed to address on file. 

## 2020-10-07 MED FILL — HYDROCHLOROTHIAZIDE 25 MG T: 25 | 30 days supply | Qty: 30 | Fill #5

## 2020-10-07 MED FILL — METFORMIN HCL 1000 MG TABS: 1000 | 30 days supply | Qty: 60 | Fill #4

## 2020-10-07 MED FILL — ATORVASTATIN CALCIUM 80 MG: 80 | 30 days supply | Qty: 30 | Fill #1

## 2020-10-07 MED FILL — AMLODIPINE BESYLATE 10 MG T: 10 | 30 days supply | Qty: 30 | Fill #1

## 2020-10-08 MED FILL — OMEPRAZOLE DR 40 MG CAPSULE: 40 | 30 days supply | Qty: 30 | Fill #1

## 2020-10-08 MED FILL — LISINOPRIL 40 MG TABS: 40 | 30 days supply | Qty: 30 | Fill #1

## 2020-10-14 ENCOUNTER — Encounter: Payer: Self-pay | Admitting: Nurse Practitioner

## 2020-10-14 ENCOUNTER — Ambulatory Visit: Payer: Self-pay | Attending: Nurse Practitioner | Admitting: Nurse Practitioner

## 2020-10-14 ENCOUNTER — Other Ambulatory Visit: Payer: Self-pay

## 2020-10-14 VITALS — BP 125/63 | HR 52 | Temp 98.6°F | Ht 68.0 in | Wt 171.0 lb

## 2020-10-14 DIAGNOSIS — I10 Essential (primary) hypertension: Secondary | ICD-10-CM

## 2020-10-14 DIAGNOSIS — IMO0002 Reserved for concepts with insufficient information to code with codable children: Secondary | ICD-10-CM

## 2020-10-14 DIAGNOSIS — E118 Type 2 diabetes mellitus with unspecified complications: Secondary | ICD-10-CM

## 2020-10-14 DIAGNOSIS — E1142 Type 2 diabetes mellitus with diabetic polyneuropathy: Secondary | ICD-10-CM

## 2020-10-14 DIAGNOSIS — E1165 Type 2 diabetes mellitus with hyperglycemia: Secondary | ICD-10-CM

## 2020-10-14 DIAGNOSIS — E785 Hyperlipidemia, unspecified: Secondary | ICD-10-CM

## 2020-10-14 LAB — GLUCOSE, POCT (MANUAL RESULT ENTRY): POC Glucose: 128 mg/dl — AB (ref 70–99)

## 2020-10-14 NOTE — Progress Notes (Signed)
Assessment & Plan:  Bradley Wolfe was seen today for follow-up.  Diagnoses and all orders for this visit:  Diabetes mellitus type 2, uncontrolled, with complications (HCC) -     Glucose (CBG) -     Microalbumin/Creatinine Ratio, Urine  Essential hypertension Continue all antihypertensives as prescribed.  Remember to bring in your blood pressure log with you for your follow up appointment.  DASH/Mediterranean Diets are healthier choices for HTN.    Hyperlipidemia LDL goal <70 INSTRUCTIONS: Work on a low fat, heart healthy diet and participate in regular aerobic exercise program by working out at least 150 minutes per week; 5 days a week-30 minutes per day. Avoid red meat/beef/steak,  fried foods. junk foods, sodas, sugary drinks, unhealthy snacking, alcohol and smoking.  Drink at least 80 oz of water per day and monitor your carbohydrate intake daily.   Diabetic polyneuropathy associated with type 2 diabetes mellitus (HCC) Take gabapentin as prescribed.    Patient has been counseled on age-appropriate routine health concerns for screening and prevention. These are reviewed and up-to-date. Referrals have been placed accordingly. Immunizations are up-to-date or declined.    Subjective:   Chief Complaint  Patient presents with  . Follow-up    Pt. Is here for a blood pressure follow up.    HPI Bradley Wolfe. 49 y.o. male presents to office today for Follow up.  has a past medical history of CAD (coronary artery disease), native coronary artery (12/21/12), Diabetes mellitus without complication ([place]), GERD (gastroesophageal reflux disease), Hyperlipidemia, Hypertension, NSTEMI, Psoriasis, and Tobacco abuse.   Left arm pain Worse over the past month. Describes pain as aching like a toothache.  He has been taking over the counter NSAID gel for pain which seems to help lessen the pain. 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  Recommended he take gabapentin and use heat application to see if this will help relieve his arm pain.    DM 2 Well controlled with metformin 1000 mg BID. He is also on ACE and STATIN. Hyperglycemic symptoms include neuropathy for which he has been prescribed gabapentin 300 mg TID but is currently not taking. LDL at goal with lipitor 80 mg daily.  Lab Results  Component Value Date   HGBA1C 7.0 08/13/2020   Lab Results  Component Value Date   LDLCALC 30 04/25/2019    Essential Hypertension Well controlled with amlodipine 10 mg daily, HCTZ 25 mg daily and toprol XL 25 mg daily. Denies chest pain, shortness of breath, palpitations, lightheadedness, dizziness, headaches or BLE edema.  BP Readings from Last 3 Encounters:  10/14/20 125/63  08/13/20 (!) 147/67  04/17/20 (!) 154/75    Review of Systems  Constitutional: Negative for fever, malaise/fatigue and weight loss.  HENT: Negative.  Negative for nosebleeds.   Eyes: Negative.  Negative for blurred vision, double vision and photophobia.  Respiratory: Negative.  Negative for cough and shortness of breath.   Cardiovascular: Negative.  Negative for chest pain, palpitations and leg swelling.  Gastrointestinal: Negative.  Negative for heartburn, nausea and vomiting.  Musculoskeletal: Positive for joint pain (see HPI). Negative for myalgias.  Skin: Positive for rash (psoriasis).  Neurological: Negative.  Negative for dizziness, focal weakness, seizures and headaches.  Psychiatric/Behavioral: Negative.  Negative for suicidal ideas.    Past Medical History:  Diagnosis Date  . CAD (coronary artery disease), native coronary artery 12/21/12   a. NSTEMI 2/14 => LHC 12/21/12: mLAD 90%, OM1  20%, mRCA 100%, EF 55-60%.  PCI: Xience Xpedition DES to mRCA and Xience Xpedition DES to mLAD.   . Diabetes mellitus without complication ([place])   . GERD (gastroesophageal reflux disease)   . Hyperlipidemia   . Hypertension   . NSTEMI   . Psoriasis   .  Tobacco abuse     Past Surgical History:  Procedure Laterality Date  . CORONARY ANGIOPLASTY WITH STENT PLACEMENT  12/21/12   90% mid LAD s/p DES, 20% OM1, 100% mid RCA s/p DES; LVEF 55-60%  . LEFT HEART CATHETERIZATION WITH CORONARY ANGIOGRAM N/A 12/21/2012   Procedure: LEFT HEART CATHETERIZATION WITH CORONARY ANGIOGRAM;  Surgeon: Burnell Blanks, MD;  Location[facility] CATH LAB;  Service: Cardiovascular;  Laterality: N/A;    Family History  Problem Relation Age of Onset  . Heart attack Father   . Heart attack Mother     Social History Reviewed with no changes to be made today.   Outpatient Medications Prior to Visit  Medication Sig Dispense Refill  . acetaminophen (TYLENOL) 500 MG tablet Take 2 tablets (1,000 mg total) by mouth every 8 (eight) hours as needed for moderate pain. 30 tablet 0  . albuterol (VENTOLIN HFA) 108 (90 Base) MCG/ACT inhaler Inhale 2 puffs into the lungs every 6 (six) hours as needed for wheezing or shortness of breath. 18 g 1  . amLODipine (NORVASC) 10 MG tablet Take 1 tablet (10 mg total) by mouth daily. 90 tablet 0  . atorvastatin (LIPITOR) 80 MG tablet Take 1 tablet (80 mg total) by mouth daily. 90 tablet 2  . betamethasone dipropionate 0.05 % cream APPLY TOPICALLY TWO TIMES DAILY. PATIENT WILL PICK UP SCRIPTS TODAY. 45 g 1  . Blood Glucose Monitoring Suppl (TRUE METRIX METER) w/Device KIT Needs new meter. We tested the his current one with a new battery and it is defective. Patient will pick up scripts today. 1 kit 0  . Coal Tar Extract (PSORIASIN EX) Apply 1 application topically as needed (for psoriasis).    [name] glucose blood test strip Use as instructed 100 each 12  . hydrochlorothiazide (HYDRODIURIL) 25 MG tablet Take 1 tablet (25 mg total) by mouth daily. 90 tablet 3  . lisinopril (ZESTRIL) 40 MG tablet Take 1 tablet (40 mg total) by mouth daily. Must have office visit for refills 90 tablet 1  . metFORMIN (GLUCOPHAGE) 1000 MG tablet Take 1 tablet (1,000  mg total) by mouth 2 (two) times daily with a meal. 180 tablet 1  . metoprolol succinate (TOPROL-XL) 25 MG 24 hr tablet Take 1 tablet (25 mg total) by mouth daily. 90 tablet 2  . nitroGLYCERIN (NITROSTAT) 0.4 MG SL tablet DISSOLVE ONE TABLET UNDER THE TONGUE EVERY 5 MINUTES AS NEEDED FOR CHEST PAIN. DO NOT EXCEED A TOTAL OF 3 DOSES IN 15 MINUTES 25 tablet 1  . omeprazole (PRILOSEC) 40 MG capsule Take 1 capsule (40 mg total) by mouth daily. Will pick up today. 90 capsule 2  . TRUEplus Lancets 26G MISC 1 each by Does not apply route every 8 (eight) hours as needed. 100 each 12  . gabapentin (NEURONTIN) 300 MG capsule Take 1 capsule (300 mg total) by mouth 3 (three) times daily. 90 capsule 2   No facility-administered medications prior to visit.    No Known Allergies     Objective:    BP 125/63 (BP Location: Left Arm, Patient Position: Sitting, Cuff Size: Normal)   Pulse (!) 52   Temp 98.6 F (37 C) (Oral)  Ht '5\' 8"'$  (1.727 m)   Wt 171 lb (77.6 kg)   SpO2 100%   BMI 26.00 kg/m  Wt Readings from Last 3 Encounters:  10/14/20 171 lb (77.6 kg)  08/13/20 175 lb (79.4 kg)  04/17/20 172 lb (78 kg)    Physical Exam Vitals and nursing note reviewed.  Constitutional:      Appearance: He is well-developed and well-nourished.  HENT:     Head: Normocephalic and atraumatic.  Eyes:     Extraocular Movements: EOM normal.  Cardiovascular:     Rate and Rhythm: Normal rate and regular rhythm.     Pulses: Intact distal pulses.     Heart sounds: Normal heart sounds. No murmur heard. No friction rub. No gallop.   Pulmonary:     Effort: Pulmonary effort is normal. No tachypnea or respiratory distress.     Breath sounds: Normal breath sounds. No decreased breath sounds, wheezing, rhonchi or rales.  Chest:     Chest wall: No tenderness.  Abdominal:     General: Bowel sounds are normal.     Palpations: Abdomen is soft.  Musculoskeletal:        General: No swelling, deformity or edema.      Cervical back: Normal range of motion.  Skin:    General: Skin is warm and dry.  Neurological:     Mental Status: He is alert and oriented to person, place, and time.     Coordination: Coordination normal.  Psychiatric:        Mood and Affect: Mood and affect normal.        Behavior: Behavior normal. Behavior is cooperative.        Thought Content: Thought content normal.        Judgment: Judgment normal.          Patient has been counseled extensively about nutrition and exercise as well as the importance of adherence with medications and regular follow-up. The patient was given clear instructions to go to ER or return to medical center if symptoms don't improve, worsen or new problems develop. The patient verbalized understanding.   Follow-up: Return in about 8 weeks (around 12/09/2020).   Gildardo Pounds, FNP-BC [facility] [place], [place]   10/19/2020, 10:37 PM

## 2020-10-19 ENCOUNTER — Encounter: Payer: Self-pay | Admitting: Nurse Practitioner

## 2020-11-07 MED FILL — GABAPENTIN 300 MG CAPSULE: 300 | 30 days supply | Qty: 90 | Fill #2

## 2020-12-17 ENCOUNTER — Ambulatory Visit: Payer: Self-pay | Admitting: Nurse Practitioner

## 2020-12-17 ENCOUNTER — Other Ambulatory Visit: Payer: Self-pay

## 2020-12-23 ENCOUNTER — Other Ambulatory Visit: Payer: Self-pay | Admitting: Nurse Practitioner

## 2020-12-23 DIAGNOSIS — E785 Hyperlipidemia, unspecified: Secondary | ICD-10-CM

## 2020-12-23 DIAGNOSIS — E1165 Type 2 diabetes mellitus with hyperglycemia: Secondary | ICD-10-CM

## 2020-12-23 DIAGNOSIS — D649 Anemia, unspecified: Secondary | ICD-10-CM

## 2020-12-23 DIAGNOSIS — I10 Essential (primary) hypertension: Secondary | ICD-10-CM

## 2020-12-24 ENCOUNTER — Other Ambulatory Visit: Payer: Self-pay

## 2020-12-25 ENCOUNTER — Ambulatory Visit: Payer: Self-pay | Attending: Nurse Practitioner

## 2020-12-25 ENCOUNTER — Other Ambulatory Visit: Payer: Self-pay

## 2020-12-25 DIAGNOSIS — D649 Anemia, unspecified: Secondary | ICD-10-CM

## 2020-12-25 DIAGNOSIS — I10 Essential (primary) hypertension: Secondary | ICD-10-CM

## 2020-12-25 DIAGNOSIS — E1165 Type 2 diabetes mellitus with hyperglycemia: Secondary | ICD-10-CM

## 2020-12-25 DIAGNOSIS — E785 Hyperlipidemia, unspecified: Secondary | ICD-10-CM

## 2020-12-26 LAB — CMP14+EGFR
ALT: 22 IU/L (ref 0–44)
AST: 21 IU/L (ref 0–40)
Albumin/Globulin Ratio: 2 (ref 1.2–2.2)
Albumin: 4.6 g/dL (ref 4.0–5.0)
Alkaline Phosphatase: 97 IU/L (ref 44–121)
BUN/Creatinine Ratio: 23 — ABNORMAL HIGH (ref 9–20)
BUN: 22 mg/dL (ref 6–24)
Bilirubin Total: 0.2 mg/dL (ref 0.0–1.2)
CO2: 25 mmol/L (ref 20–29)
Calcium: 9.3 mg/dL (ref 8.7–10.2)
Chloride: 102 mmol/L (ref 96–106)
Creatinine, Ser: 0.96 mg/dL (ref 0.76–1.27)
Globulin, Total: 2.3 g/dL (ref 1.5–4.5)
Glucose: 109 mg/dL — ABNORMAL HIGH (ref 65–99)
Potassium: 4.4 mmol/L (ref 3.5–5.2)
Sodium: 140 mmol/L (ref 134–144)
Total Protein: 6.9 g/dL (ref 6.0–8.5)
eGFR: 96 mL/min/{1.73_m2} (ref >59)

## 2020-12-26 LAB — CBC
Hematocrit: 38.2 % (ref 37.5–51.0)
Hemoglobin: 13 g/dL (ref 13.0–17.7)
MCH: 30 pg (ref 26.6–33.0)
MCHC: 34 g/dL (ref 31.5–35.7)
MCV: 88 fL (ref 79–97)
Platelets: 209 10*3/uL (ref 150–450)
RBC: 4.33 x10E6/uL (ref 4.14–5.80)
RDW: 12 % (ref 11.6–15.4)
WBC: 5.4 10*3/uL (ref 3.4–10.8)

## 2020-12-26 LAB — LIPID PANEL
Chol/HDL Ratio: 2.1 ratio (ref 0.0–5.0)
Cholesterol, Total: 134 mg/dL (ref 100–199)
HDL: 64 mg/dL (ref >39)
LDL Chol Calc (NIH): 60 mg/dL (ref 0–99)
Triglycerides: 42 mg/dL (ref 0–149)
VLDL Cholesterol Cal: 10 mg/dL (ref 5–40)

## 2020-12-26 LAB — HEMOGLOBIN A1C
Est. average glucose Bld gHb Est-mCnc: 111 mg/dL
Hgb A1c MFr Bld: 5.5 % (ref 4.8–5.6)

## 2020-12-26 LAB — MICROALBUMIN / CREATININE URINE RATIO
Creatinine, Urine: 174.4 mg/dL
Microalb/Creat Ratio: 4 mg/g creat (ref 0–29)
Microalbumin, Urine: 6.9 ug/mL

## 2020-12-26 MED FILL — AMLODIPINE BESYLATE 10 MG T: 10 | 30 days supply | Qty: 30 | Fill #2

## 2020-12-26 MED FILL — ATORVASTATIN CALCIUM 80 MG: 80 | 30 days supply | Qty: 30 | Fill #2

## 2021-01-01 MED FILL — METFORMIN HCL 1000 MG TABS: 1000 | 30 days supply | Qty: 60 | Fill #0

## 2021-01-13 MED FILL — METOPROLOL SUCCINATE ER 25: 25 | 30 days supply | Qty: 30 | Fill #0

## 2021-02-04 ENCOUNTER — Ambulatory Visit: Payer: Self-pay | Admitting: Nurse Practitioner

## 2021-02-15 MED FILL — Atorvastatin Calcium Tab 80 MG (Base Equivalent): ORAL | 30 days supply | Qty: 30 | Fill #0 | Status: AC

## 2021-02-16 ENCOUNTER — Other Ambulatory Visit: Payer: Self-pay

## 2021-02-18 ENCOUNTER — Other Ambulatory Visit: Payer: Self-pay

## 2021-02-23 ENCOUNTER — Other Ambulatory Visit: Payer: Self-pay | Admitting: Critical Care Medicine

## 2021-02-23 ENCOUNTER — Other Ambulatory Visit: Payer: Self-pay

## 2021-02-23 ENCOUNTER — Other Ambulatory Visit: Payer: Self-pay | Admitting: Nurse Practitioner

## 2021-02-23 MED ORDER — GABAPENTIN 300 MG PO CAPS
ORAL_CAPSULE | Freq: Three times a day (TID) | ORAL | 1 refills | Status: DC
  Filled 2021-02-23: qty 30, 30d supply, fill #0
  Filled 2021-03-13: qty 90, 30d supply, fill #0
  Filled 2021-06-01: qty 90, 30d supply, fill #1

## 2021-02-23 MED ORDER — AMLODIPINE BESYLATE 10 MG PO TABS
ORAL_TABLET | Freq: Every day | ORAL | 0 refills | Status: DC
  Filled 2021-02-23 – 2021-03-13 (×2): qty 30, 30d supply, fill #0
  Filled 2021-05-14: qty 30, 30d supply, fill #1
  Filled 2021-08-20: qty 30, 30d supply, fill #2

## 2021-02-23 NOTE — Telephone Encounter (Signed)
LOV 10/14/20. No future appointment in chart. Approved per protocol.  Requested Prescriptions  Pending Prescriptions Disp Refills  . amLODipine (NORVASC) 10 MG tablet 90 tablet 0    Sig: TAKE 1 TABLET (10 MG TOTAL) BY MOUTH DAILY.     Cardiovascular:  Calcium Channel Blockers Passed - 02/23/2021  2:58 PM      Passed - Last BP in normal range    BP Readings from Last 1 Encounters:  10/14/20 125/63         Passed - Valid encounter within last 6 months    Recent Outpatient Visits          4 months ago Diabetes mellitus type 2, uncontrolled, with complications [facility])   [facility] [place], Shea Stakes, NP   6 months ago Need for influenza vaccination   [facility] Drucilla Chalet, RPH-CPP   6 months ago Type 2 diabetes mellitus with hyperglycemia, without long-term current use of insulin [facility])   [place] [facility] Storm Frisk, MD   1 year ago Diabetes mellitus type 2, uncontrolled, with complications [facility])   [place] [facility] Claiborne Rigg, NP   1 year ago Essential hypertension   [facility] [place], Shea Stakes, NP

## 2021-02-23 NOTE — Telephone Encounter (Signed)
Approved per protocol.  Requested Prescriptions  Pending Prescriptions Disp Refills  . gabapentin (NEURONTIN) 300 MG capsule 90 capsule 2    Sig: TAKE 1 CAPSULE (300 MG TOTAL) BY MOUTH 3 (THREE) TIMES DAILY.     Neurology: Anticonvulsants - gabapentin Passed - 02/23/2021  2:58 PM      Passed - Valid encounter within last 12 months    Recent Outpatient Visits          4 months ago Diabetes mellitus type 2, uncontrolled, with complications [facility])   [place] [facility] [place], Shea Stakes, NP   6 months ago Need for influenza vaccination   [facility] Drucilla Chalet, RPH-CPP   6 months ago Type 2 diabetes mellitus with hyperglycemia, without long-term current use of insulin [facility])   [facility] Storm Frisk, MD   1 year ago Diabetes mellitus type 2, uncontrolled, with complications [facility])   [place] [facility] Claiborne Rigg, NP   1 year ago Essential hypertension   [facility] [place], Shea Stakes, NP

## 2021-03-02 ENCOUNTER — Other Ambulatory Visit: Payer: Self-pay

## 2021-03-13 ENCOUNTER — Other Ambulatory Visit: Payer: Self-pay

## 2021-03-27 ENCOUNTER — Other Ambulatory Visit: Payer: Self-pay

## 2021-03-27 MED FILL — Omeprazole Cap Delayed Release 40 MG: ORAL | 30 days supply | Qty: 30 | Fill #0 | Status: AC

## 2021-03-27 MED FILL — Metoprolol Succinate Tab ER 24HR 25 MG (Tartrate Equiv): ORAL | 30 days supply | Qty: 30 | Fill #0 | Status: AC

## 2021-03-27 MED FILL — Metformin HCl Tab 1000 MG: ORAL | 30 days supply | Qty: 60 | Fill #0 | Status: AC

## 2021-04-02 ENCOUNTER — Other Ambulatory Visit: Payer: Self-pay

## 2021-05-14 ENCOUNTER — Other Ambulatory Visit: Payer: Self-pay

## 2021-05-14 MED FILL — Lisinopril Tab 40 MG: ORAL | 30 days supply | Qty: 30 | Fill #0 | Status: AC

## 2021-05-14 MED FILL — Atorvastatin Calcium Tab 80 MG (Base Equivalent): ORAL | 30 days supply | Qty: 30 | Fill #1 | Status: AC

## 2021-05-15 ENCOUNTER — Other Ambulatory Visit: Payer: Self-pay

## 2021-05-19 ENCOUNTER — Other Ambulatory Visit: Payer: Self-pay

## 2021-05-29 ENCOUNTER — Other Ambulatory Visit: Payer: Self-pay

## 2021-06-01 ENCOUNTER — Other Ambulatory Visit: Payer: Self-pay | Admitting: Critical Care Medicine

## 2021-06-01 ENCOUNTER — Other Ambulatory Visit: Payer: Self-pay

## 2021-06-01 DIAGNOSIS — I10 Essential (primary) hypertension: Secondary | ICD-10-CM

## 2021-06-02 ENCOUNTER — Other Ambulatory Visit: Payer: Self-pay

## 2021-06-03 ENCOUNTER — Other Ambulatory Visit: Payer: Self-pay

## 2021-06-19 MED FILL — Omeprazole Cap Delayed Release 40 MG: ORAL | 30 days supply | Qty: 30 | Fill #1 | Status: AC

## 2021-06-19 MED FILL — Metformin HCl Tab 1000 MG: ORAL | 30 days supply | Qty: 60 | Fill #1 | Status: AC

## 2021-06-19 MED FILL — Lisinopril Tab 40 MG: ORAL | 30 days supply | Qty: 30 | Fill #1 | Status: AC

## 2021-06-22 ENCOUNTER — Other Ambulatory Visit: Payer: Self-pay

## 2021-08-20 ENCOUNTER — Other Ambulatory Visit: Payer: Self-pay | Admitting: Nurse Practitioner

## 2021-08-20 ENCOUNTER — Other Ambulatory Visit: Payer: Self-pay | Admitting: Critical Care Medicine

## 2021-08-21 ENCOUNTER — Other Ambulatory Visit: Payer: Self-pay

## 2021-08-21 ENCOUNTER — Other Ambulatory Visit: Payer: Self-pay | Admitting: Nurse Practitioner

## 2021-08-21 MED ORDER — METOPROLOL SUCCINATE ER 25 MG PO TB24
ORAL_TABLET | Freq: Every day | ORAL | 0 refills | Status: DC
  Filled 2021-08-21: qty 30, 30d supply, fill #0

## 2021-08-21 MED ORDER — OMEPRAZOLE 40 MG PO CPDR
DELAYED_RELEASE_CAPSULE | ORAL | 0 refills | Status: DC
  Filled 2021-08-21: qty 30, 30d supply, fill #0

## 2021-08-21 MED ORDER — ATORVASTATIN CALCIUM 80 MG PO TABS
ORAL_TABLET | Freq: Every day | ORAL | 0 refills | Status: DC
  Filled 2021-08-21: qty 30, 30d supply, fill #0

## 2021-08-21 MED ORDER — LISINOPRIL 40 MG PO TABS
ORAL_TABLET | ORAL | 0 refills | Status: DC
  Filled 2021-08-21: qty 30, 30d supply, fill #0

## 2021-08-21 MED ORDER — METFORMIN HCL 1000 MG PO TABS
ORAL_TABLET | Freq: Two times a day (BID) | ORAL | 0 refills | Status: DC
  Filled 2021-08-21: qty 60, 30d supply, fill #0

## 2021-08-21 NOTE — Telephone Encounter (Signed)
Requested Prescriptions  Pending Prescriptions Disp Refills  . atorvastatin (LIPITOR) 80 MG tablet 90 tablet 0    Sig: TAKE 1 TABLET (80 MG TOTAL) BY MOUTH DAILY.     Cardiovascular:  Antilipid - Statins Passed - 08/20/2021  7:03 PM      Passed - Total Cholesterol in normal range and within 360 days    Cholesterol, Total  Date Value Ref Range Status  12/25/2020 134 100 - 199 mg/dL Final         Passed - LDL in normal range and within 360 days    LDL Chol Calc (NIH)  Date Value Ref Range Status  12/25/2020 60 0 - 99 mg/dL Final         Passed - HDL in normal range and within 360 days    HDL  Date Value Ref Range Status  12/25/2020 64 >39 mg/dL Final         Passed - Triglycerides in normal range and within 360 days    Triglycerides  Date Value Ref Range Status  12/25/2020 42 0 - 149 mg/dL Final         Passed - Patient is not pregnant      Passed - Valid encounter within last 12 months    Recent Outpatient Visits          10 months ago Diabetes mellitus type 2, uncontrolled, with complications ([place])   [place], Vernia Buff, NP   1 year ago Need for influenza vaccination   [place], Jarome Matin, RPH-CPP   1 year ago Type 2 diabetes mellitus with hyperglycemia, without long-term current use of insulin [facility])   [place] Elsie Stain, MD   1 year ago Diabetes mellitus type 2, uncontrolled, with complications [facility])   [place], [place] W, NP   2 years ago Essential hypertension   [place], [place] W, NP             . lisinopril (ZESTRIL) 40 MG tablet 90 tablet 1    Sig: TAKE 1 TABLET (40 MG TOTAL) BY MOUTH DAILY. MUST HAVE OFFICE VISIT FOR REFILLS     Cardiovascular:  ACE Inhibitors Failed - 08/20/2021  7:03 PM      Failed - Cr in normal range and within 180 days    Creat   Date Value Ref Range Status  09/28/2016 1.02 0.60 - 1.35 mg/dL Final   Creatinine, Ser  Date Value Ref Range Status  12/25/2020 0.96 0.76 - 1.27 mg/dL Final   Creatinine,U  Date Value Ref Range Status  12/21/2012 93.0 mg/dL Final    Comment:    (NOTE) Cutoff Values for Urine Drug Screen:        Drug Class           Cutoff (ng/mL)        Amphetamines            1000        Barbiturates             200        Cocaine Metabolites      300        Benzodiazepines          200        Methadone  300        Opiates                 2000        Phencyclidine             25        Propoxyphene             300        Marijuana Metabolites     50 For medical purposes only.         Failed - K in normal range and within 180 days    Potassium  Date Value Ref Range Status  12/25/2020 4.4 3.5 - 5.2 mmol/L Final         Failed - Valid encounter within last 6 months    Recent Outpatient Visits          10 months ago Diabetes mellitus type 2, uncontrolled, with complications [facility])   [place], Vernia Buff, NP   1 year ago Need for influenza vaccination   [place], RPH-CPP   1 year ago Type 2 diabetes mellitus with hyperglycemia, without long-term current use of insulin [facility])   [place] Elsie Stain, MD   1 year ago Diabetes mellitus type 2, uncontrolled, with complications [facility])   [place], Vernia Buff, NP   2 years ago Essential hypertension   [place], Vernia Buff, [place]             Passed - Patient is not pregnant      Passed - Last BP in normal range    BP Readings from Last 1 Encounters:  10/14/20 125/63         . metoprolol succinate (TOPROL-XL) 25 MG 24 hr tablet 90 tablet 2    Sig: TAKE 1 TABLET (25 MG TOTAL) BY MOUTH DAILY.     Cardiovascular:  Beta  Blockers Failed - 08/20/2021  7:03 PM      Failed - Valid encounter within last 6 months    Recent Outpatient Visits          10 months ago Diabetes mellitus type 2, uncontrolled, with complications [facility])   [place], Vernia Buff, NP   1 year ago Need for influenza vaccination   [place], RPH-CPP   1 year ago Type 2 diabetes mellitus with hyperglycemia, without long-term current use of insulin [facility])   [place], Patrick E, MD   1 year ago Diabetes mellitus type 2, uncontrolled, with complications [facility])   [place], Zelda W, NP   2 years ago Essential hypertension   [place], [place] W, NP             Passed - Last BP in normal range    BP Readings from Last 1 Encounters:  10/14/20 125/63         Passed - Last Heart Rate in normal range    Pulse Readings from Last 1 Encounters:  10/14/20 (!) 52         . omeprazole (PRILOSEC) 40 MG capsule 90 capsule 0    Sig: TAKE 1 CAPSULE (40 MG TOTAL) BY MOUTH DAILY. WILL  PICK UP TODAY.     Gastroenterology: Proton Pump Inhibitors Passed - 08/20/2021  7:03 PM      Passed - Valid encounter within last 12 months    Recent Outpatient Visits          10 months ago Diabetes mellitus type 2, uncontrolled, with complications [facility])   [place], Vernia Buff, NP   1 year ago Need for influenza vaccination   [place], RPH-CPP   1 year ago Type 2 diabetes mellitus with hyperglycemia, without long-term current use of insulin [facility])   [place], Patrick E, MD   1 year ago Diabetes mellitus type 2, uncontrolled, with complications [facility])   [place], Vernia Buff, NP   2 years ago  Essential hypertension   [place], [place] W, NP             . metFORMIN (GLUCOPHAGE) 1000 MG tablet 180 tablet 1    Sig: TAKE 1 TABLET (1,000 MG TOTAL) BY MOUTH 2 (TWO) TIMES DAILY WITH A MEAL.     Endocrinology:  Diabetes - Biguanides Failed - 08/20/2021  7:03 PM      Failed - HBA1C is between 0 and 7.9 and within 180 days    HbA1c, POC (controlled diabetic range)  Date Value Ref Range Status  08/13/2020 7.0 0.0 - 7.0 % Final   Hgb A1c MFr Bld  Date Value Ref Range Status  12/25/2020 5.5 4.8 - 5.6 % Final    Comment:             Prediabetes: 5.7 - 6.4          Diabetes: >6.4          Glycemic control for adults with diabetes: <7.0          Failed - AA eGFR in normal range and within 360 days    GFR, Est African American  Date Value Ref Range Status  09/28/2016 >89 >=60 mL/min Final   GFR calc Af Amer  Date Value Ref Range Status  08/13/2020 119 >59 mL/min/1.73 Final    Comment:    **In accordance with recommendations from the NKF-ASN Task force,**   [facility] is in the process of updating its eGFR calculation to the   2021 CKD-EPI creatinine equation that estimates kidney function   without a race variable.    GFR, Est Non African American  Date Value Ref Range Status  09/28/2016 88 >=60 mL/min Final   GFR calc non Af Amer  Date Value Ref Range Status  08/13/2020 103 >59 mL/min/1.73 Final   GFR  Date Value Ref Range Status  05/15/2015 122.13 >60.00 mL/min Final   eGFR  Date Value Ref Range Status  12/25/2020 96 >59 mL/min/1.73 Final    Comment:    **In accordance with recommendations from the NKF-ASN Task force,**   [facility] has updated its eGFR calculation to the 2021 CKD-EPI   creatinine equation that estimates kidney function without a race   variable.          Failed - Valid encounter within last 6 months    Recent Outpatient Visits          10 months ago Diabetes mellitus type 2, uncontrolled, with  complications [facility])   [place], Vernia Buff, NP   1 year  ago Need for influenza vaccination   [place], RPH-CPP   1 year ago Type 2 diabetes mellitus with hyperglycemia, without long-term current use of insulin [facility])   [place] Elsie Stain, MD   1 year ago Diabetes mellitus type 2, uncontrolled, with complications [facility])   [place], Vernia Buff, NP   2 years ago Essential hypertension   [place], Vernia Buff, NP             Passed - Cr in normal range and within 360 days    Creat  Date Value Ref Range Status  09/28/2016 1.02 0.60 - 1.35 mg/dL Final   Creatinine, Ser  Date Value Ref Range Status  12/25/2020 0.96 0.76 - 1.27 mg/dL Final   Creatinine,U  Date Value Ref Range Status  12/21/2012 93.0 mg/dL Final    Comment:    (NOTE) Cutoff Values for Urine Drug Screen:        Drug Class           Cutoff (ng/mL)        Amphetamines            1000        Barbiturates             200        Cocaine Metabolites      300        Benzodiazepines          200        Methadone                300        Opiates                 2000        Phencyclidine             25        Propoxyphene             300        Marijuana Metabolites     50 For medical purposes only.

## 2021-08-21 NOTE — Telephone Encounter (Signed)
Requested medications are due for refill today yes  Requested medications are on the active medication list yes  Last visit 10/14/20, labs from 12/25/20  Future visit scheduled no  Notes to clinic These meds failed protocol of visit within 6 months, pertinent labs on time, please assess.  Requested Prescriptions  Pending Prescriptions Disp Refills   lisinopril (ZESTRIL) 40 MG tablet 90 tablet 1    Sig: TAKE 1 TABLET (40 MG TOTAL) BY MOUTH DAILY. MUST HAVE OFFICE VISIT FOR REFILLS     Cardiovascular:  ACE Inhibitors Failed - 08/20/2021  7:03 PM      Failed - Cr in normal range and within 180 days    Creat  Date Value Ref Range Status  09/28/2016 1.02 0.60 - 1.35 mg/dL Final   Creatinine, Ser  Date Value Ref Range Status  12/25/2020 0.96 0.76 - 1.27 mg/dL Final   Creatinine,U  Date Value Ref Range Status  12/21/2012 93.0 mg/dL Final    Comment:    (NOTE) Cutoff Values for Urine Drug Screen:        Drug Class           Cutoff (ng/mL)        Amphetamines            1000        Barbiturates             200        Cocaine Metabolites      300        Benzodiazepines          200        Methadone                300        Opiates                 2000        Phencyclidine             25        Propoxyphene             300        Marijuana Metabolites     50 For medical purposes only.          Failed - K in normal range and within 180 days    Potassium  Date Value Ref Range Status  12/25/2020 4.4 3.5 - 5.2 mmol/L Final          Failed - Valid encounter within last 6 months    Recent Outpatient Visits           10 months ago Diabetes mellitus type 2, uncontrolled, with complications [facility])   [place], Vernia Buff, NP   1 year ago Need for influenza vaccination   [place], RPH-CPP   1 year ago Type 2 diabetes mellitus with hyperglycemia, without long-term current use of insulin  [facility])   [place] Elsie Stain, MD   1 year ago Diabetes mellitus type 2, uncontrolled, with complications [facility])   [place] Gildardo Pounds, NP   2 years ago Essential hypertension   [place], Vernia Buff, NP              Passed - Patient is not pregnant      Passed - Last  BP in normal range    BP Readings from Last 1 Encounters:  10/14/20 125/63           metoprolol succinate (TOPROL-XL) 25 MG 24 hr tablet 90 tablet 2    Sig: TAKE 1 TABLET (25 MG TOTAL) BY MOUTH DAILY.     Cardiovascular:  Beta Blockers Failed - 08/20/2021  7:03 PM      Failed - Valid encounter within last 6 months    Recent Outpatient Visits           10 months ago Diabetes mellitus type 2, uncontrolled, with complications [facility])   [place], Vernia Buff, NP   1 year ago Need for influenza vaccination   [place], RPH-CPP   1 year ago Type 2 diabetes mellitus with hyperglycemia, without long-term current use of insulin [facility])   [place], Patrick E, MD   1 year ago Diabetes mellitus type 2, uncontrolled, with complications [facility])   [place], Vernia Buff, NP   2 years ago Essential hypertension   [place], [place] W, NP              Passed - Last BP in normal range    BP Readings from Last 1 Encounters:  10/14/20 125/63          Passed - Last Heart Rate in normal range    Pulse Readings from Last 1 Encounters:  10/14/20 (!) 52           metFORMIN (GLUCOPHAGE) 1000 MG tablet 180 tablet 1    Sig: TAKE 1 TABLET (1,000 MG TOTAL) BY MOUTH 2 (TWO) TIMES DAILY WITH A MEAL.     Endocrinology:  Diabetes - Biguanides Failed - 08/20/2021  7:03 PM      Failed - HBA1C is between 0 and 7.9 and  within 180 days    HbA1c, POC (controlled diabetic range)  Date Value Ref Range Status  08/13/2020 7.0 0.0 - 7.0 % Final   Hgb A1c MFr Bld  Date Value Ref Range Status  12/25/2020 5.5 4.8 - 5.6 % Final    Comment:             Prediabetes: 5.7 - 6.4          Diabetes: >6.4          Glycemic control for adults with diabetes: <7.0           Failed - AA eGFR in normal range and within 360 days    GFR, Est African American  Date Value Ref Range Status  09/28/2016 >89 >=60 mL/min Final   GFR calc Af Amer  Date Value Ref Range Status  08/13/2020 119 >59 mL/min/1.73 Final    Comment:    **In accordance with recommendations from the NKF-ASN Task force,**   [facility] is in the process of updating its eGFR calculation to the   2021 CKD-EPI creatinine equation that estimates kidney function   without a race variable.    GFR, Est Non African American  Date Value Ref Range Status  09/28/2016 88 >=60 mL/min Final   GFR calc non Af Amer  Date Value Ref Range Status  08/13/2020 103 >59 mL/min/1.73 Final   GFR  Date Value Ref Range Status  05/15/2015 122.13 >60.00 mL/min Final   eGFR  Date Value Ref Range Status  12/25/2020 96 >59 mL/min/1.73 Final    Comment:    **In accordance with recommendations from the NKF-ASN Task force,**   [facility] has updated its eGFR calculation to the 2021 CKD-EPI   creatinine equation that estimates kidney function without a race   variable.           Failed - Valid encounter within last 6 months    Recent Outpatient Visits           10 months ago Diabetes mellitus type 2, uncontrolled, with complications [facility])   [place], [place] W, NP   1 year ago Need for influenza vaccination   [place], RPH-CPP   1 year ago Type 2 diabetes mellitus with hyperglycemia, without long-term current use of insulin [facility])   [place]  Elsie Stain, MD   1 year ago Diabetes mellitus type 2, uncontrolled, with complications [facility])   [place], Vernia Buff, NP   2 years ago Essential hypertension   [place], Vernia Buff, NP              Passed - Cr in normal range and within 360 days    Creat  Date Value Ref Range Status  09/28/2016 1.02 0.60 - 1.35 mg/dL Final   Creatinine, Ser  Date Value Ref Range Status  12/25/2020 0.96 0.76 - 1.27 mg/dL Final   Creatinine,U  Date Value Ref Range Status  12/21/2012 93.0 mg/dL Final    Comment:    (NOTE) Cutoff Values for Urine Drug Screen:        Drug Class           Cutoff (ng/mL)        Amphetamines            1000        Barbiturates             200        Cocaine Metabolites      300        Benzodiazepines          200        Methadone                300        Opiates                 2000        Phencyclidine             25        Propoxyphene             300        Marijuana Metabolites     50 For medical purposes only.          Signed Prescriptions Disp Refills   atorvastatin (LIPITOR) 80 MG tablet 90 tablet 0    Sig: TAKE 1 TABLET (80 MG TOTAL) BY MOUTH DAILY.     Cardiovascular:  Antilipid - Statins Passed - 08/20/2021  7:03 PM      Passed - Total Cholesterol in normal range and within 360 days    Cholesterol, Total  Date Value Ref Range Status  12/25/2020 134 100 - 199 mg/dL Final          Passed - LDL in normal range and within 360 days  LDL Chol Calc (NIH)  Date Value Ref Range Status  12/25/2020 60 0 - 99 mg/dL Final          Passed - HDL in normal range and within 360 days    HDL  Date Value Ref Range Status  12/25/2020 64 >39 mg/dL Final          Passed - Triglycerides in normal range and within 360 days    Triglycerides  Date Value Ref Range Status  12/25/2020 42 0 - 149 mg/dL Final          Passed - Patient is not pregnant      Passed - Valid  encounter within last 12 months    Recent Outpatient Visits           10 months ago Diabetes mellitus type 2, uncontrolled, with complications ([place])   [place], Vernia Buff, NP   1 year ago Need for influenza vaccination   [place], RPH-CPP   1 year ago Type 2 diabetes mellitus with hyperglycemia, without long-term current use of insulin [facility])   [place] Elsie Stain, MD   1 year ago Diabetes mellitus type 2, uncontrolled, with complications [facility])   [place], Vernia Buff, NP   2 years ago Essential hypertension   [place], NP               omeprazole (PRILOSEC) 40 MG capsule 90 capsule 0    Sig: TAKE 1 CAPSULE (40 MG TOTAL) BY MOUTH DAILY. WILL PICK UP TODAY.     Gastroenterology: Proton Pump Inhibitors Passed - 08/20/2021  7:03 PM      Passed - Valid encounter within last 12 months    Recent Outpatient Visits           10 months ago Diabetes mellitus type 2, uncontrolled, with complications [facility])   [place], Vernia Buff, NP   1 year ago Need for influenza vaccination   [place], RPH-CPP   1 year ago Type 2 diabetes mellitus with hyperglycemia, without long-term current use of insulin [facility])   [place] Elsie Stain, MD   1 year ago Diabetes mellitus type 2, uncontrolled, with complications [facility])   [place], Zelda W, NP   2 years ago Essential hypertension   [place], Vernia Buff, NP

## 2021-08-21 NOTE — Telephone Encounter (Signed)
Requested medications are due for refill today yes  Requested medications are on the active medication list yes  Last refill 06/02/21  Last visit 10/14/20  Future visit scheduled NO, asked to return in Feb 2022, no upcoming appt scheduled  Notes to clinic does meet protocol, however, MD asked to return in 8 weeks after Dec 2021 appt, no appt scheduled.  Requested Prescriptions  Pending Prescriptions Disp Refills   gabapentin (NEURONTIN) 300 MG capsule 90 capsule 1    Sig: TAKE 1 CAPSULE (300 MG TOTAL) BY MOUTH 3 (THREE) TIMES DAILY.     Neurology: Anticonvulsants - gabapentin Passed - 08/20/2021  7:02 PM      Passed - Valid encounter within last 12 months    Recent Outpatient Visits           10 months ago Diabetes mellitus type 2, uncontrolled, with complications [facility])   [place] [facility] [place], Shea Stakes, NP   1 year ago Need for influenza vaccination   [facility] [place], RPH-CPP   1 year ago Type 2 diabetes mellitus with hyperglycemia, without long-term current use of insulin [facility])   [place] [facility] Storm Frisk, MD   1 year ago Diabetes mellitus type 2, uncontrolled, with complications [facility])    [facility] Claiborne Rigg, NP   2 years ago Essential hypertension   [facility] [place], Shea Stakes, NP

## 2021-08-22 MED ORDER — BETAMETHASONE DIPROPIONATE 0.05 % EX CREA
TOPICAL_CREAM | CUTANEOUS | 1 refills | Status: DC
  Filled 2021-08-22: qty 45, 30d supply, fill #0
  Filled 2021-10-14: qty 45, 14d supply, fill #0

## 2021-08-22 NOTE — Telephone Encounter (Signed)
Requested Prescriptions  Pending Prescriptions Disp Refills  . betamethasone dipropionate 0.05 % cream 45 g 1    Sig: APPLY TOPICALLY TWO TIMES DAILY. PATIENT WILL PICK UP SCRIPTS TODAY.     Off-Protocol Failed - 08/21/2021  1:56 PM      Failed - Medication not assigned to a protocol, review manually.      Passed - Valid encounter within last 12 months    Recent Outpatient Visits          10 months ago Diabetes mellitus type 2, uncontrolled, with complications [facility])   [place] [facility] [place], Shea Stakes, NP   1 year ago Need for influenza vaccination   [facility] Drucilla Chalet, RPH-CPP   1 year ago Type 2 diabetes mellitus with hyperglycemia, without long-term current use of insulin [facility])   [place] [facility] Storm Frisk, MD   1 year ago Diabetes mellitus type 2, uncontrolled, with complications [facility])   [place] [facility] Claiborne Rigg, NP   2 years ago Essential hypertension   [facility] [place], Shea Stakes, NP

## 2021-08-24 ENCOUNTER — Other Ambulatory Visit: Payer: Self-pay

## 2021-08-31 ENCOUNTER — Other Ambulatory Visit: Payer: Self-pay

## 2021-09-06 ENCOUNTER — Other Ambulatory Visit: Payer: Self-pay | Admitting: Nurse Practitioner

## 2021-09-07 ENCOUNTER — Other Ambulatory Visit: Payer: Self-pay

## 2021-09-07 MED ORDER — GABAPENTIN 300 MG PO CAPS
ORAL_CAPSULE | Freq: Three times a day (TID) | ORAL | 1 refills | Status: DC
  Filled 2021-09-07 – 2021-10-14 (×2): qty 90, 30d supply, fill #0

## 2021-09-07 NOTE — Telephone Encounter (Signed)
Requested Prescriptions  Pending Prescriptions Disp Refills  . gabapentin (NEURONTIN) 300 MG capsule 90 capsule 1    Sig: TAKE 1 CAPSULE (300 MG TOTAL) BY MOUTH 3 (THREE) TIMES DAILY.     Neurology: Anticonvulsants - gabapentin Passed - 09/06/2021  8:56 PM      Passed - Valid encounter within last 12 months    Recent Outpatient Visits          10 months ago Diabetes mellitus type 2, uncontrolled, with complications [facility])   [place] [facility] [place], Shea Stakes, NP   1 year ago Need for influenza vaccination   [facility] Wellness Drucilla Chalet, RPH-CPP   1 year ago Type 2 diabetes mellitus with hyperglycemia, without long-term current use of insulin [facility])   [place] [facility] Storm Frisk, MD   1 year ago Diabetes mellitus type 2, uncontrolled, with complications [facility])   [place] [facility] Wellness Claiborne Rigg, NP   2 years ago Essential hypertension   [facility] [place], Shea Stakes, NP

## 2021-09-14 ENCOUNTER — Other Ambulatory Visit: Payer: Self-pay

## 2021-10-02 ENCOUNTER — Other Ambulatory Visit: Payer: Self-pay

## 2021-10-02 ENCOUNTER — Emergency Department (HOSPITAL_COMMUNITY)
Admission: EM | Admit: 2021-10-02 | Discharge: 2021-10-02 | Disposition: A | Discharge status: Home / Self Care | Payer: 59 | Attending: Emergency Medicine | Admitting: Emergency Medicine

## 2021-10-02 ENCOUNTER — Encounter (HOSPITAL_COMMUNITY): Payer: Self-pay | Admitting: Emergency Medicine

## 2021-10-02 DIAGNOSIS — S0990XA Unspecified injury of head, initial encounter: Secondary | ICD-10-CM | POA: Diagnosis present

## 2021-10-02 DIAGNOSIS — Z87891 Personal history of nicotine dependence: Secondary | ICD-10-CM | POA: Diagnosis not present

## 2021-10-02 DIAGNOSIS — Z7984 Long term (current) use of oral hypoglycemic drugs: Secondary | ICD-10-CM | POA: Insufficient documentation

## 2021-10-02 DIAGNOSIS — E785 Hyperlipidemia, unspecified: Secondary | ICD-10-CM | POA: Insufficient documentation

## 2021-10-02 DIAGNOSIS — I2510 Atherosclerotic heart disease of native coronary artery without angina pectoris: Secondary | ICD-10-CM | POA: Insufficient documentation

## 2021-10-02 DIAGNOSIS — E1169 Type 2 diabetes mellitus with other specified complication: Secondary | ICD-10-CM | POA: Insufficient documentation

## 2021-10-02 DIAGNOSIS — W01198A Fall on same level from slipping, tripping and stumbling with subsequent striking against other object, initial encounter: Secondary | ICD-10-CM | POA: Diagnosis not present

## 2021-10-02 DIAGNOSIS — Z79899 Other long term (current) drug therapy: Secondary | ICD-10-CM | POA: Diagnosis not present

## 2021-10-02 DIAGNOSIS — I10 Essential (primary) hypertension: Secondary | ICD-10-CM | POA: Diagnosis not present

## 2021-10-02 DIAGNOSIS — S01112A Laceration without foreign body of left eyelid and periocular area, initial encounter: Secondary | ICD-10-CM | POA: Insufficient documentation

## 2021-10-02 DIAGNOSIS — S0181XA Laceration without foreign body of other part of head, initial encounter: Secondary | ICD-10-CM

## 2021-10-02 DIAGNOSIS — Z23 Encounter for immunization: Secondary | ICD-10-CM | POA: Diagnosis not present

## 2021-10-02 MED ORDER — TETANUS-DIPHTH-ACELL PERTUSSIS 5-2.5-18.5 LF-MCG/0.5 IM SUSY
0.5000 mL | PREFILLED_SYRINGE | Freq: Once | INTRAMUSCULAR | Status: AC
  Administered 2021-10-02: 0.5 mL via INTRAMUSCULAR
  Filled 2021-10-02: qty 0.5

## 2021-10-02 MED ORDER — LIDOCAINE-EPINEPHRINE (PF) 2 %-1:200000 IJ SOLN
10.0000 mL | Freq: Once | INTRAMUSCULAR | Status: AC
  Administered 2021-10-02: 10 mL
  Filled 2021-10-02: qty 20

## 2021-10-02 NOTE — ED Provider Notes (Addendum)
[facility] DEPT Provider Note   CSN: [id number] Arrival date & time: 10/02/21  4944     History Chief Complaint  Patient presents with   Laceration    Bradley Wolfe. is a 50 y.o. male presenting with chief complaint of left eyebrow laceration.  Patient states that approximately 1700 he woke from sleep tripped over his flap and fell face forward into a dresser.  Patient denies any loss of consciousness.  Patient reports that hemorrhage was controlled with direct pressure.  Patient denies any neck pain, back pain, numbness, weakness, facial asymmetry, dysarthria, visual disturbance, nausea, vomiting, headache, bowel/bladder incontinence.  Patient is not on any blood thinners.  Per chart review patient's last tetanus shot was 09/2016.   Laceration Location:  Face Facial laceration location:  L eyebrow Length:  1cm Quality: stellate   Time since incident:  4 hours Laceration mechanism:  Fall Pain details:    Severity:  No pain Tetanus status:  Out of date Associated symptoms: no fever, no focal weakness, no numbness and no rash       Past Medical History:  Diagnosis Date   CAD (coronary artery disease), native coronary artery 12/21/12   a. NSTEMI 2/14 => LHC 12/21/12: mLAD 90%, OM1 20%, mRCA 100%, EF 55-60%.  PCI: Xience Xpedition DES to mRCA and Xience Xpedition DES to mLAD.    Diabetes mellitus without complication (HCC)    GERD (gastroesophageal reflux disease)    Hyperlipidemia    Hypertension    NSTEMI    Psoriasis    Tobacco abuse     Patient Active Problem List   Diagnosis Date Noted   Type 2 diabetes mellitus ([place]) 07/01/2017   Psoriasis 07/14/2016   CAD (coronary artery disease), native coronary artery 12/22/2012   Hyperlipidemia 12/22/2012   History of non-ST elevation myocardial infarction (NSTEMI) 12/21/2012   Essential hypertension 12/21/2012   GERD (gastroesophageal reflux disease) 12/21/2012    Past Surgical  History:  Procedure Laterality Date   CORONARY ANGIOPLASTY WITH STENT PLACEMENT  12/21/12   90% mid LAD s/p DES, 20% OM1, 100% mid RCA s/p DES; LVEF 55-60%   LEFT HEART CATHETERIZATION WITH CORONARY ANGIOGRAM N/A 12/21/2012   Procedure: LEFT HEART CATHETERIZATION WITH CORONARY ANGIOGRAM;  Surgeon: Burnell Blanks, MD;  Location: [facility] CATH LAB;  Service: Cardiovascular;  Laterality: N/A;       Family History  Problem Relation Age of Onset   Heart attack Father    Heart attack Mother     Social History   Tobacco Use   Smoking status: Former    Packs/day: 0.25    Years: 20.00    Pack years: 5.00    Types: Cigarettes    Quit date: 02/20/2016    Years since quitting: 5.6   Smokeless tobacco: Never   Tobacco comments:    Smoke [place] OCC.   Vaping Use   Vaping Use: Never used  Substance Use Topics   Alcohol use: Yes    Alcohol/week: 1.0 standard drink    Types: 1 Standard drinks or equivalent per week    Comment: rarely    Drug use: Yes    Types: Marijuana    Home Medications Prior to Admission medications   Medication Sig Start Date End Date Taking? Authorizing Provider  acetaminophen (TYLENOL) 500 MG tablet Take 2 tablets (1,000 mg total) by mouth every 8 (eight) hours as needed for moderate pain. 07/01/17   Alfonse Spruce, FNP  albuterol (VENTOLIN HFA)  108 (90 Base) MCG/ACT inhaler Inhale 2 puffs into the lungs every 6 (six) hours as needed for wheezing or shortness of breath. 07/30/19   Gildardo Pounds, NP  amLODipine (NORVASC) 10 MG tablet Take 1 tablet (10 mg total) by mouth daily. 08/13/20 11/11/20  Elsie Stain, MD  amLODipine (NORVASC) 10 MG tablet TAKE 1 TABLET (10 MG TOTAL) BY MOUTH DAILY. 02/23/21 02/23/22  Gildardo Pounds, NP  atorvastatin (LIPITOR) 80 MG tablet Take 1 tablet (80 mg total) by mouth daily. 08/13/20 11/11/20  Elsie Stain, MD  atorvastatin (LIPITOR) 80 MG tablet TAKE 1 TABLET (80 MG TOTAL) BY MOUTH DAILY. 08/21/21 08/21/22  Gildardo Pounds, NP  betamethasone dipropionate 0.05 % cream APPLY TOPICALLY TWO TIMES DAILY. PATIENT WILL PICK UP SCRIPTS TODAY. 08/13/20   Elsie Stain, MD  betamethasone dipropionate 0.05 % cream APPLY TOPICALLY TWO TIMES DAILY. 08/22/21 08/22/22  Elsie Stain, MD  Blood Glucose Monitoring Suppl (TRUE METRIX METER) w/Device KIT Needs new meter. We tested the his current one with a new battery and it is defective. Patient will pick up scripts today. 04/25/19   Gildardo Pounds, NP  Coal Tar Extract (PSORIASIN EX) Apply 1 application topically as needed (for psoriasis).    [provider]  gabapentin (NEURONTIN) 300 MG capsule Take 1 capsule (300 mg total) by mouth 3 (three) times daily. 08/13/20 09/12/20  Elsie Stain, MD  gabapentin (NEURONTIN) 300 MG capsule TAKE 1 CAPSULE (300 MG TOTAL) BY MOUTH 3 (THREE) TIMES DAILY. 09/07/21 09/07/22  Gildardo Pounds, NP  glucose blood test strip Use as instructed 08/13/20   Elsie Stain, MD  hydrochlorothiazide (HYDRODIURIL) 25 MG tablet Take 1 tablet (25 mg total) by mouth daily. 08/13/20   Elsie Stain, MD  lisinopril (ZESTRIL) 40 MG tablet TAKE 1 TABLET (40 MG TOTAL) BY MOUTH DAILY. MUST HAVE OFFICE VISIT FOR REFILLS 08/21/21 08/21/22  Charlott Rakes, MD  metFORMIN (GLUCOPHAGE) 1000 MG tablet Take 1 tablet (1,000 mg total) by mouth 2 (two) times daily with a meal. 08/13/20 11/11/20  Elsie Stain, MD  metFORMIN (GLUCOPHAGE) 1000 MG tablet TAKE 1 TABLET (1,000 MG TOTAL) BY MOUTH 2 (TWO) TIMES DAILY WITH A MEAL. 08/21/21 08/21/22  Charlott Rakes, MD  metoprolol succinate (TOPROL-XL) 25 MG 24 hr tablet TAKE 1 TABLET (25 MG TOTAL) BY MOUTH DAILY. 08/21/21 08/21/22  Charlott Rakes, MD  nitroGLYCERIN (NITROSTAT) 0.4 MG SL tablet DISSOLVE ONE TABLET UNDER THE TONGUE EVERY 5 MINUTES AS NEEDED FOR CHEST PAIN. DO NOT EXCEED A TOTAL OF 3 DOSES IN 15 MINUTES 11/02/19   Charlott Rakes, MD  omeprazole (PRILOSEC) 40 MG capsule Take 1 capsule  (40 mg total) by mouth daily. Will pick up today. 08/13/20 11/11/20  Elsie Stain, MD  omeprazole (PRILOSEC) 40 MG capsule TAKE 1 CAPSULE (40 MG TOTAL) BY MOUTH DAILY. WILL PICK UP TODAY. 08/21/21 08/21/22  Gildardo Pounds, NP  TRUEplus Lancets 26G MISC 1 each by Does not apply route every 8 (eight) hours as needed. 08/13/20   Elsie Stain, MD    Allergies    Patient has no known allergies.  Review of Systems   Review of Systems  Constitutional:  Negative for chills and fever.  HENT:  Negative for facial swelling.   Eyes:  Negative for visual disturbance.  Respiratory:  Negative for shortness of breath.   Cardiovascular:  Negative for chest pain.  Gastrointestinal:  Negative for abdominal pain, nausea and vomiting.  Genitourinary:  Negative for enuresis.  Musculoskeletal:  Negative for back pain and neck pain.  Skin:  Positive for wound. Negative for color change and rash.  Neurological:  Negative for dizziness, tremors, focal weakness, seizures, syncope, facial asymmetry, speech difficulty, weakness, light-headedness, numbness and headaches.  Psychiatric/Behavioral:  Negative for confusion.    Physical Exam Updated Vital Signs BP (!) 171/80 (BP Location: Left Arm)   Pulse 61   Temp 98.1 F (36.7 C) (Oral)   Resp 18   Ht _0  (1.727 m)   Wt 77.1 kg   SpO2 100%   BMI 25.85 kg/m   Physical Exam Vitals and nursing note reviewed.  Constitutional:      General: He is not in acute distress.    Appearance: He is not ill-appearing, toxic-appearing or diaphoretic.  HENT:     Head: Normocephalic. Laceration present. No raccoon eyes, Battle's sign, abrasion, contusion, masses, right periorbital erythema or left periorbital erythema.     Jaw: No trismus, swelling, pain on movement or malocclusion.     Comments: 1 cm stellate laceration to left eyebrow as pictured below. Eyes:     General: No scleral icterus.       Right eye: No discharge.        Left eye: No discharge.      Extraocular Movements: Extraocular movements intact.     Conjunctiva/sclera: Conjunctivae normal.     Pupils: Pupils are equal, round, and reactive to light.  Cardiovascular:     Rate and Rhythm: Normal rate.  Pulmonary:     Effort: Pulmonary effort is normal.  Musculoskeletal:     Cervical back: Normal range of motion and neck supple. No edema, erythema, signs of trauma, rigidity, torticollis or crepitus. No pain with movement, spinous process tenderness or muscular tenderness. Normal range of motion.     Comments: No midline tenderness or deformity to cervical, thoracic, or lumbar spine.  Skin:    General: Skin is warm and dry.  Neurological:     General: No focal deficit present.     Mental Status: He is alert.     GCS: GCS eye subscore is 4. GCS verbal subscore is 5. GCS motor subscore is 6.     Cranial Nerves: Cranial nerves 2-12 are intact. No cranial nerve deficit, dysarthria or facial asymmetry.     Comments: Patient moves all limbs equally without difficulty.  Able to stand and ambulate without difficulty.  Psychiatric:        Behavior: Behavior is cooperative.      ED Results / Procedures / Treatments   Labs (all labs ordered are listed, but only abnormal results are displayed) Labs Reviewed - No data to display  EKG None  Radiology No results found.  Procedures .Marland KitchenLaceration Repair  Date/Time: 10/02/2021 10:14 PM Performed by: Loni Beckwith, PA-C Authorized by: Loni Beckwith, PA-C   Consent:    Consent obtained:  Verbal   Consent given by:  Patient   Risks discussed:  Infection, need for additional repair, pain, poor cosmetic result, poor wound healing and retained foreign body   Alternatives discussed:  No treatment and delayed treatment Universal protocol:    Procedure explained and questions answered to patient or proxy's satisfaction: yes     Immediately prior to procedure, a time out was called: yes     Patient identity confirmed:   Verbally with patient and arm band Anesthesia:    Anesthesia method:  Local infiltration   Local anesthetic:  Lidocaine 2% WITH epi Laceration details:    Location:  Face   Face location:  L eyebrow   Length (cm):  1   Depth (mm):  2 Pre-procedure details:    Preparation:  Patient was prepped and draped in usual sterile fashion Exploration:    Wound exploration: entire depth of wound visualized     Wound extent: no foreign bodies/material noted   Treatment:    Area cleansed with:  Povidone-iodine   Amount of cleaning:  Standard   Irrigation solution:  Sterile saline   Irrigation method:  Syringe Skin repair:    Repair method:  Sutures   Suture size:  6-0   Suture material:  Prolene   Suture technique:  Simple interrupted   Number of sutures:  4 Approximation:    Approximation:  Close Repair type:    Repair type:  Complex Post-procedure details:    Dressing:  Non-adherent dressing   Procedure completion:  Tolerated well, no immediate complications   Medications Ordered in ED Medications  lidocaine-EPINEPHrine (XYLOCAINE W/EPI) 2 %-1:200000 (PF) injection 10 mL (has no administration in time range)  Tdap (BOOSTRIX) injection 0.5 mL (has no administration in time range)    ED Course  I have reviewed the triage vital signs and the nursing notes.  Pertinent labs & imaging results that were available during my care of the patient were reviewed by me and considered in my medical decision making (see chart for details).    MDM Rules/Calculators/A&P                           Alert 50 year old male no acute distress, nontoxic-appearing.  Presents to ED with chief complaint of left eyebrow laceration.  Patient denies any loss of consciousness, numbness, weakness, visual disturbance, saddle anesthesia, bowel/bladder incontinence.  Patient is not on any blood thinners.  CN II through XII intact.  Patient moves all limbs equally without difficulty.  No midline tenderness to  cervical, thoracic, or lumbar spine.  No raccoon sign, battle sign, contusion or ecchymosis to patient's head.  Suspicion for intracranial or spinal injury at this time.  Patient's tetanus shot updated.  Suture procedure as noted above.  Patient to have sutures removed in 5 to 7 days.  Discussed wound care with patient.  Discussed signs and symptoms of infection.  Discussed results, findings, treatment and follow up. Patient advised of return precautions. Patient verbalized understanding and agreed with plan.   Final Clinical Impression(s) / ED Diagnoses Final diagnoses:  None    Rx / DC Orders ED Discharge Orders     None        Loni Beckwith, PA-C 10/02/21 2215    Loni Beckwith, PA-C 10/02/21 2216    Luna Fuse, MD 10/05/21 [phone]

## 2021-10-02 NOTE — ED Provider Notes (Signed)
Emergency Medicine Provider Triage Evaluation Note  Bradley Wolfe. , a 50 y.o. male  was evaluated in triage.  Pt complains of a laceration to the left brow.  Patient states that he just woke up as he works third shift saw his dog was in the trash can he went to go grab him tripped over self off and struck his head against a Child psychotherapist.  No LOC.  No focal weakness or numbness.  Does take 81 mg of aspirin.  Review of Systems  Positive:  Negative: See above   Physical Exam  BP (!) 171/80 (BP Location: Left Arm)   Pulse 61   Temp 98.1 F (36.7 C) (Oral)   Resp 18   Ht 5\' 8"  (1.727 m)   Wt 77.1 kg   SpO2 100%   BMI 25.85 kg/m  Gen:   Awake, no distress   Resp:  Normal effort  MSK:   Moves extremities without difficulty  Other:  Do not centimeters stellate laceration to the left brow.  Medical Decision Making  Medically screening exam initiated at 7:47 PM.  Appropriate orders placed.  . was informed that the remainder of the evaluation will be completed by another provider, this initial triage assessment does not replace that evaluation, and the importance of remaining in the ED until their evaluation is complete.  Will likely need suture repair   Darnelle Maffucci, PA-C 10/02/21 1948    14/09/22, MD 10/02/21 2121

## 2021-10-02 NOTE — Discharge Instructions (Addendum)
You came to the emergency department today to be evaluated for your facial laceration.  Your laceration required stitches.  You will need to follow-up with your primary care provider, urgent care, or return to the emergency department in 5 to 7 days to have your stitches removed.  Please keep the wound dry for the next 24 hours.  After that you may gently wash the area with soap and water.  Please not submerge the wound under water until the sutures are removed.  Get help right away if: You develop severe swelling around the wound. Your pain suddenly increases and is severe. You develop painful lumps near the wound or on skin anywhere else on your body. You have a red streak going away from your wound.

## 2021-10-02 NOTE — ED Triage Notes (Signed)
PT reports trip and fall hitting L head on dresser. Lac to L head. Takes ASA daily. Denies LOC.

## 2021-10-14 ENCOUNTER — Other Ambulatory Visit: Payer: Self-pay

## 2021-10-14 ENCOUNTER — Other Ambulatory Visit: Payer: Self-pay | Admitting: Nurse Practitioner

## 2021-10-14 ENCOUNTER — Ambulatory Visit: Payer: Self-pay

## 2021-10-14 DIAGNOSIS — J410 Simple chronic bronchitis: Secondary | ICD-10-CM

## 2021-10-14 MED ORDER — ALBUTEROL SULFATE HFA 108 (90 BASE) MCG/ACT IN AERS
2.0000 | INHALATION_SPRAY | Freq: Four times a day (QID) | RESPIRATORY_TRACT | 1 refills | Status: AC | PRN
  Filled 2021-10-14 – 2021-12-18 (×2): qty 18, 25d supply, fill #0

## 2021-10-14 NOTE — Telephone Encounter (Signed)
° °  Chief Complaint: Episode of shortness of breath Symptoms: Short of breath Frequency: Happened last week Pertinent Negatives: Patient denies any difficulty now. Disposition: [] ED /[] Urgent Care (no appt availability in office) / [x] Appointment(In office/virtual)/ []  [facility]/ [] Home Care/ [] Refused Recommended Disposition  Additional Notes: Had an episode with exertion last week. Requesting refills on the following medications - Albuterol inhaler, gabapentin, and betamethasone cream. Requests appointment "after the first of the year with my new insurance." Please advise in regard to refills.  Reason for Disposition  [1] MILD longstanding difficulty breathing AND [2]  SAME as normal  Answer Assessment - Initial Assessment Questions 1. RESPIRATORY STATUS: "Describe your breathing?" (e.g., wheezing, shortness of breath, unable to speak, severe coughing)      Shortness of breath 2. ONSET: "When did this breathing problem begin?"      Last week 3. PATTERN "Does the difficult breathing come and go, or has it been constant since it started?"      Comes and goes 4. SEVERITY: "How bad is your breathing?" (e.g., mild, moderate, severe)    - MILD: No SOB at rest, mild SOB with walking, speaks normally in sentences, can lie down, no retractions, pulse < 100.    - MODERATE: SOB at rest, SOB with minimal exertion and prefers to sit, cannot lie down flat, speaks in phrases, mild retractions, audible wheezing, pulse 100-120.    - SEVERE: Very SOB at rest, speaks in single words, struggling to breathe, sitting hunched forward, retractions, pulse > 120      Mild 5. RECURRENT SYMPTOM: "Have you had difficulty breathing before?" If Yes, ask: "When was the last time?" and "What happened that time?"      Yes 6. CARDIAC HISTORY: "Do you have any history of heart disease?" (e.g., heart attack, angina, bypass surgery, angioplasty)      No 7. LUNG HISTORY: "Do you have any history of lung  disease?"  (e.g., pulmonary embolus, asthma, emphysema)     No 8. CAUSE: "What do you think is causing the breathing problem?"      Unsure 9. OTHER SYMPTOMS: "Do you have any other symptoms? (e.g., dizziness, runny nose, cough, chest pain, fever)     No 10. O2 SATURATION MONITOR:  "Do you use an oxygen saturation monitor (pulse oximeter) at home?" If Yes, "What is your reading (oxygen level) today?" "What is your usual oxygen saturation reading?" (e.g., 95%)       No 11. PREGNANCY: "Is there any chance you are pregnant?" "When was your last menstrual period?"       N/a 12. TRAVEL: "Have you traveled out of the country in the last month?" (e.g., travel history, exposures)       No  Protocols used: Breathing Difficulty-A-AH

## 2021-10-14 NOTE — Telephone Encounter (Signed)
Has upcoming appointment

## 2021-10-14 NOTE — Telephone Encounter (Signed)
Unable to  reach. No voicemail.  Will schedule with PCE tomorrow, virtual.

## 2021-10-15 ENCOUNTER — Other Ambulatory Visit: Payer: Self-pay

## 2021-10-20 ENCOUNTER — Other Ambulatory Visit: Payer: Self-pay

## 2021-10-21 ENCOUNTER — Other Ambulatory Visit: Payer: Self-pay

## 2021-10-22 ENCOUNTER — Other Ambulatory Visit: Payer: Self-pay

## 2021-12-09 ENCOUNTER — Other Ambulatory Visit: Payer: Self-pay

## 2021-12-09 ENCOUNTER — Encounter (HOSPITAL_COMMUNITY): Payer: Self-pay | Admitting: Emergency Medicine

## 2021-12-09 ENCOUNTER — Emergency Department (HOSPITAL_COMMUNITY)
Admission: EM | Admit: 2021-12-09 | Discharge: 2021-12-10 | Disposition: A | Discharge status: Home / Self Care | Payer: 59 | Attending: Emergency Medicine | Admitting: Emergency Medicine

## 2021-12-09 ENCOUNTER — Emergency Department (HOSPITAL_COMMUNITY): Payer: 59

## 2021-12-09 DIAGNOSIS — Z7984 Long term (current) use of oral hypoglycemic drugs: Secondary | ICD-10-CM | POA: Diagnosis not present

## 2021-12-09 DIAGNOSIS — E119 Type 2 diabetes mellitus without complications: Secondary | ICD-10-CM | POA: Insufficient documentation

## 2021-12-09 DIAGNOSIS — Z79899 Other long term (current) drug therapy: Secondary | ICD-10-CM | POA: Insufficient documentation

## 2021-12-09 DIAGNOSIS — R519 Headache, unspecified: Secondary | ICD-10-CM | POA: Insufficient documentation

## 2021-12-09 DIAGNOSIS — I10 Essential (primary) hypertension: Secondary | ICD-10-CM | POA: Insufficient documentation

## 2021-12-09 LAB — COMPREHENSIVE METABOLIC PANEL
ALT: 34 U/L (ref 0–44)
AST: 21 U/L (ref 15–41)
Albumin: 3.7 g/dL (ref 3.5–5.0)
Alkaline Phosphatase: 91 U/L (ref 38–126)
Anion gap: 8 (ref 5–15)
BUN: 17 mg/dL (ref 6–20)
CO2: 24 mmol/L (ref 22–32)
Calcium: 8.9 mg/dL (ref 8.9–10.3)
Chloride: 106 mmol/L (ref 98–111)
Creatinine, Ser: 1 mg/dL (ref 0.61–1.24)
GFR, Estimated: >60 mL/min (ref >60)
Glucose, Bld: 106 mg/dL — ABNORMAL HIGH (ref 70–99)
Potassium: 3.9 mmol/L (ref 3.5–5.1)
Sodium: 138 mmol/L (ref 135–145)
Total Bilirubin: 0.5 mg/dL (ref 0.3–1.2)
Total Protein: 6.6 g/dL (ref 6.5–8.1)

## 2021-12-09 LAB — CBC WITH DIFFERENTIAL/PLATELET
Abs Immature Granulocytes: 0.01 10*3/uL (ref 0.00–0.07)
Basophils Absolute: 0.1 10*3/uL (ref 0.0–0.1)
Basophils Relative: 1 %
Eosinophils Absolute: 0.1 10*3/uL (ref 0.0–0.5)
Eosinophils Relative: 2 %
HCT: 39.4 % (ref 39.0–52.0)
Hemoglobin: 13.5 g/dL (ref 13.0–17.0)
Immature Granulocytes: 0 %
Lymphocytes Relative: 39 %
Lymphs Abs: 1.8 10*3/uL (ref 0.7–4.0)
MCH: 30.5 pg (ref 26.0–34.0)
MCHC: 34.3 g/dL (ref 30.0–36.0)
MCV: 89.1 fL (ref 80.0–100.0)
Monocytes Absolute: 0.4 10*3/uL (ref 0.1–1.0)
Monocytes Relative: 9 %
Neutro Abs: 2.4 10*3/uL (ref 1.7–7.7)
Neutrophils Relative %: 49 %
Platelets: 207 10*3/uL (ref 150–400)
RBC: 4.42 MIL/uL (ref 4.22–5.81)
RDW: 12.2 % (ref 11.5–15.5)
WBC: 4.8 10*3/uL (ref 4.0–10.5)
nRBC: 0 % (ref 0.0–0.2)

## 2021-12-09 LAB — LIPASE, BLOOD: Lipase: 35 U/L (ref 11–51)

## 2021-12-09 NOTE — ED Triage Notes (Signed)
Pt reported to ED with c/o headache x past few days accompanied with intermittent nausea. Pt also states he has had excessive sleepiness and pain to right foot.

## 2021-12-09 NOTE — ED Provider Triage Note (Signed)
Emergency Medicine Provider Triage Evaluation Note  Bradley Wolfe. , a 51 y.o. male  was evaluated in triage.  Pt complains of headaches for the last 3 days.   Review of Systems  Positive: Headaches, nv Negative: Photosensitivity, numbness, weakness  Physical Exam  BP 132/71 (BP Location: Right Arm)    Pulse (!) 50    Temp 98.1 F (36.7 C) (Oral)    Resp 16    SpO2 99%  Gen:   Awake, no distress   Resp:  Normal effort  MSK:   Moves extremities without difficulty  Other:  CN II-XII intact, 5/5 strength to the BUE/BLE, normal sensation throughout, normal finger to nose bilat  Medical Decision Making  Medically screening exam initiated at 10:49 PM.  Appropriate orders placed.  Ree Edman. was informed that the remainder of the evaluation will be completed by another provider, this initial triage assessment does not replace that evaluation, and the importance of remaining in the ED until their evaluation is complete.     Rodney Booze, [place] 12/09/21 2253

## 2021-12-10 NOTE — Discharge Instructions (Signed)
You may take aspirin and Tylenol for your headaches.  An alternative to aspirin is ibuprofen.  Do not take both.    Please follow-up with your primary care provider about your decreased heart rate.  Return to the emergency department for any worsening headaches, dizziness, visual disturbance, chest pain, shortness of breath or any other worsening of your symptoms.

## 2021-12-10 NOTE — ED Provider Notes (Signed)
[facility] EMERGENCY DEPARTMENT Provider Note   CSN: [id number] Arrival date & time: 12/09/21  2219     History  Chief Complaint  Patient presents with   Headache    Bradley Wolfe. is a 51 y.o. male with a past medical history of type 2 diabetes, hypertension and hyperlipidemia who presented yesterday with a complaint of a headache.  He says that after his many hours in the waiting room his headache is now 100% gone.  He took 2 aspirin prior to checking in yesterday which she believes helped him.  Prior to that he had 3 days worth of this headache.  It was severe and in the front of his head.  He denied any photophobia, visual changes, nausea, vomiting or feeling off balance.  He does not usually get headaches however over the past month he has had a few more than usual.    Home Medications Prior to Admission medications   Medication Sig Start Date End Date Taking? Authorizing Provider  acetaminophen (TYLENOL) 500 MG tablet Take 2 tablets (1,000 mg total) by mouth every 8 (eight) hours as needed for moderate pain. 07/01/17   Alfonse Spruce, FNP  albuterol (VENTOLIN HFA) 108 (90 Base) MCG/ACT inhaler Inhale 2 puffs into the lungs every 6 (six) hours as needed for wheezing or shortness of breath. 10/14/21   Gildardo Pounds, NP  amLODipine (NORVASC) 10 MG tablet Take 1 tablet (10 mg total) by mouth daily. 08/13/20 11/11/20  Elsie Stain, MD  amLODipine (NORVASC) 10 MG tablet TAKE 1 TABLET (10 MG TOTAL) BY MOUTH DAILY. 02/23/21 02/23/22  Gildardo Pounds, NP  atorvastatin (LIPITOR) 80 MG tablet Take 1 tablet (80 mg total) by mouth daily. 08/13/20 11/11/20  Elsie Stain, MD  atorvastatin (LIPITOR) 80 MG tablet TAKE 1 TABLET (80 MG TOTAL) BY MOUTH DAILY. 08/21/21 08/21/22  Gildardo Pounds, NP  betamethasone dipropionate 0.05 % cream APPLY TOPICALLY TWO TIMES DAILY. PATIENT WILL PICK UP SCRIPTS TODAY. 08/13/20   Elsie Stain, MD  betamethasone  dipropionate 0.05 % cream APPLY TOPICALLY TWO TIMES DAILY. 08/22/21 08/22/22  Elsie Stain, MD  Blood Glucose Monitoring Suppl (TRUE METRIX METER) w/Device KIT Needs new meter. We tested the his current one with a new battery and it is defective. Patient will pick up scripts today. 04/25/19   Gildardo Pounds, NP  Coal Tar Extract (PSORIASIN EX) Apply 1 application topically as needed (for psoriasis).    [provider]  gabapentin (NEURONTIN) 300 MG capsule Take 1 capsule (300 mg total) by mouth 3 (three) times daily. 08/13/20 09/12/20  Elsie Stain, MD  gabapentin (NEURONTIN) 300 MG capsule TAKE 1 CAPSULE (300 MG TOTAL) BY MOUTH 3 (THREE) TIMES DAILY. 09/07/21 09/07/22  Gildardo Pounds, NP  glucose blood test strip Use as instructed 08/13/20   Elsie Stain, MD  hydrochlorothiazide (HYDRODIURIL) 25 MG tablet Take 1 tablet (25 mg total) by mouth daily. 08/13/20   Elsie Stain, MD  lisinopril (ZESTRIL) 40 MG tablet TAKE 1 TABLET (40 MG TOTAL) BY MOUTH DAILY. MUST HAVE OFFICE VISIT FOR REFILLS 08/21/21 08/21/22  Charlott Rakes, MD  metFORMIN (GLUCOPHAGE) 1000 MG tablet Take 1 tablet (1,000 mg total) by mouth 2 (two) times daily with a meal. 08/13/20 11/11/20  Elsie Stain, MD  metFORMIN (GLUCOPHAGE) 1000 MG tablet TAKE 1 TABLET (1,000 MG TOTAL) BY MOUTH 2 (TWO) TIMES DAILY WITH A MEAL. 08/21/21 08/21/22  Charlott Rakes, MD  metoprolol succinate (TOPROL-XL) 25 MG 24 hr tablet TAKE 1 TABLET (25 MG TOTAL) BY MOUTH DAILY. 08/21/21 08/21/22  Charlott Rakes, MD  nitroGLYCERIN (NITROSTAT) 0.4 MG SL tablet DISSOLVE ONE TABLET UNDER THE TONGUE EVERY 5 MINUTES AS NEEDED FOR CHEST PAIN. DO NOT EXCEED A TOTAL OF 3 DOSES IN 15 MINUTES 11/02/19   Charlott Rakes, MD  omeprazole (PRILOSEC) 40 MG capsule Take 1 capsule (40 mg total) by mouth daily. Will pick up today. 08/13/20 11/11/20  Elsie Stain, MD  omeprazole (PRILOSEC) 40 MG capsule TAKE 1 CAPSULE (40 MG TOTAL) BY MOUTH DAILY.  WILL PICK UP TODAY. 08/21/21 08/21/22  Gildardo Pounds, NP  TRUEplus Lancets 26G MISC 1 each by Does not apply route every 8 (eight) hours as needed. 08/13/20   Elsie Stain, MD      Allergies    Patient has no known allergies.    Review of Systems   Review of Systems  Neurological:  Positive for headaches.  See HPI Physical Exam Updated Vital Signs BP (!) 150/81    Pulse (!) 47    Temp 98.5 F (36.9 C) (Oral)    Resp 18    Ht _0  (1.727 m)    Wt 77.1 kg    SpO2 99%    BMI 25.84 kg/m  Physical Exam Vitals and nursing note reviewed.  Constitutional:      General: He is not in acute distress.    Appearance: Normal appearance. He is not ill-appearing.  HENT:     Head: Normocephalic and atraumatic.  Eyes:     General: No visual field deficit or scleral icterus.    Conjunctiva/sclera: Conjunctivae normal.  Cardiovascular:     Rate and Rhythm: Normal rate and regular rhythm.  Pulmonary:     Effort: Pulmonary effort is normal. No respiratory distress.     Breath sounds: No wheezing.  Musculoskeletal:     Cervical back: Normal range of motion.  Skin:    Findings: No rash.  Neurological:     Mental Status: He is alert.     GCS: GCS eye subscore is 4. GCS verbal subscore is 5. GCS motor subscore is 6.     Cranial Nerves: No cranial nerve deficit or dysarthria.     Motor: No weakness.  Psychiatric:        Mood and Affect: Mood normal.    ED Results / Procedures / Treatments   Labs (all labs ordered are listed, but only abnormal results are displayed) Labs Reviewed  COMPREHENSIVE METABOLIC PANEL - Abnormal; Notable for the following components:      Result Value   Glucose, Bld 106 (*)    All other components within normal limits  CBC WITH DIFFERENTIAL/PLATELET  LIPASE, BLOOD    EKG EKG Interpretation  Date/Time:  Thursday December 10 2021 08:32:22 EST Ventricular Rate:  57 PR Interval:  140 QRS Duration: 94 QT Interval:  410 QTC Calculation: 399 R  Axis:   123 Text Interpretation: Sinus bradycardia with Premature atrial complexes Right axis deviation Incomplete right bundle branch block Abnormal ECG When compared with ECG of 31-May-2016 00:17,  Similar ECG to previous Confirmed by Gareth Morgan [phone]) on 12/10/2021 8:42:12 AM  Radiology CT Head Wo Contrast  Result Date: 12/09/2021 CLINICAL DATA:  Headache for several days, intermittent nausea, drowsiness EXAM: CT HEAD WITHOUT CONTRAST TECHNIQUE: Contiguous axial images were obtained from the base of the skull through the vertex without intravenous contrast. RADIATION DOSE REDUCTION: This exam was  performed according to the departmental dose-optimization program which includes automated exposure control, adjustment of the mA and/or kV according to patient size and/or use of iterative reconstruction technique. COMPARISON:  None. FINDINGS: Brain: No acute infarct or hemorrhage. Focal hypodensity left basal ganglia consistent with dilated perivascular space or prior lacunar infarct. Lateral ventricles and remaining midline structures are unremarkable. No acute extra-axial fluid collections. No mass effect. Vascular: No hyperdense vessel or unexpected calcification. Skull: Normal. Negative for fracture or focal lesion. Sinuses/Orbits: No acute finding. Other: None. IMPRESSION: 1. No acute intracranial process. 2. 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 Electronically Signed   By: Randa Ngo M.D.   On: 12/09/2021 23:21    Procedures Procedures    Medications Ordered in ED Medications - No data to display  ED Course/ Medical Decision Making/ A&P                           Medical Decision Making 51 year old male presented for headache.  It is since resolved.  He denied any visual changes, feeling off balance, nausea, vomiting or other complications.  Emergent considerations for headache include subarachnoid hemorrhage, meningitis, temporal  arteritis, glaucoma, cerebral ischemia, carotid/vertebral dissection, intracranial tumor, Venous sinus thrombosis, carbon monoxide poisoning, acute or chronic subdural hemorrhage.  All of these were considered throughout the evaluation of the patient.  Physical exam: A complete neurologic exam was conducted and patient had no abnormalities.  Work-up: Patient had lab work ordered in triage.  This was reviewed by me and there are no abnormalities.  Imaging: CT head was ordered in triage, individually reviewed by me.  I see no signs of acute findings, no signs of hemorrhage.  Radiologist did note a " dilated perivascular space versus chronic lacunar infarct," however I do not believe this is the cause of patient's symptoms today.  Evaluation/disposition:  Patient was noted to have a heart rate in the 40s upon vital checks this morning.  He denied any dizziness, palpitations, lightheadedness, presyncope, chest pain or shortness of breath.  He reports that sometimes his heart rate is low.  He says that he continues to take his metoprolol for his blood pressure that he reports is fairly well controlled at home.  Metoprolol is likely the cause of his bradycardia.   EKG individually reviewed by me and confirmed by Dr. Billy Fischer.  No concerning findings.  Because it is asymptomatic and patient is scheduling a follow-up with his primary care provider, I believe the benefits outweigh the risks of taking him off of this medication if he is asymptomatic.  Uncontrolled hypertension is likely to worsen his headaches.  Patient is requesting discharge at this time.  I believe this is reasonable.  Work note attached to papers.        Final Clinical Impression(s) / ED Diagnoses Final diagnoses:  Bad headache    Rx / DC Orders Results and diagnoses were explained to the patient. Return precautions discussed in full. Patient had no additional questions and expressed complete understanding.   This chart was  dictated using voice recognition software.  Despite best efforts to proofread,  errors can occur which can change the documentation meaning.    Darliss Ridgel 12/10/21 1123    Blanchie Dessert, MD 12/10/21 2129

## 2021-12-10 NOTE — ED Notes (Signed)
Pt c/o N/V w/HA. Pt denies vision changes or photosensitivity.

## 2021-12-18 ENCOUNTER — Ambulatory Visit: Payer: 59 | Attending: Nurse Practitioner | Admitting: Nurse Practitioner

## 2021-12-18 ENCOUNTER — Other Ambulatory Visit: Payer: Self-pay

## 2021-12-18 ENCOUNTER — Encounter: Payer: Self-pay | Admitting: Nurse Practitioner

## 2021-12-18 VITALS — BP 157/85 | HR 57 | Resp 18 | Ht 68.0 in | Wt 199.2 lb

## 2021-12-18 DIAGNOSIS — I10 Essential (primary) hypertension: Secondary | ICD-10-CM

## 2021-12-18 DIAGNOSIS — E1142 Type 2 diabetes mellitus with diabetic polyneuropathy: Secondary | ICD-10-CM

## 2021-12-18 DIAGNOSIS — E1165 Type 2 diabetes mellitus with hyperglycemia: Secondary | ICD-10-CM

## 2021-12-18 DIAGNOSIS — R350 Frequency of micturition: Secondary | ICD-10-CM

## 2021-12-18 DIAGNOSIS — E785 Hyperlipidemia, unspecified: Secondary | ICD-10-CM

## 2021-12-18 DIAGNOSIS — K219 Gastro-esophageal reflux disease without esophagitis: Secondary | ICD-10-CM

## 2021-12-18 DIAGNOSIS — M79672 Pain in left foot: Secondary | ICD-10-CM

## 2021-12-18 DIAGNOSIS — I25118 Atherosclerotic heart disease of native coronary artery with other forms of angina pectoris: Secondary | ICD-10-CM

## 2021-12-18 DIAGNOSIS — Z1211 Encounter for screening for malignant neoplasm of colon: Secondary | ICD-10-CM

## 2021-12-18 DIAGNOSIS — M79671 Pain in right foot: Secondary | ICD-10-CM

## 2021-12-18 DIAGNOSIS — L409 Psoriasis, unspecified: Secondary | ICD-10-CM

## 2021-12-18 DIAGNOSIS — Z23 Encounter for immunization: Secondary | ICD-10-CM

## 2021-12-18 LAB — GLUCOSE, POCT (MANUAL RESULT ENTRY): POC Glucose: 94 mg/dl (ref 70–99)

## 2021-12-18 MED ORDER — ATORVASTATIN CALCIUM 80 MG PO TABS
80.0000 mg | ORAL_TABLET | Freq: Every day | ORAL | 2 refills | Status: DC
  Filled 2021-12-18: qty 30, 30d supply, fill #0
  Filled 2022-03-10: qty 30, 30d supply, fill #1

## 2021-12-18 MED ORDER — OMEPRAZOLE 40 MG PO CPDR
DELAYED_RELEASE_CAPSULE | ORAL | 0 refills | Status: DC
  Filled 2021-12-18 – 2022-03-10 (×2): qty 30, 30d supply, fill #0

## 2021-12-18 MED ORDER — GABAPENTIN 300 MG PO CAPS
300.0000 mg | ORAL_CAPSULE | Freq: Three times a day (TID) | ORAL | 2 refills | Status: DC
  Filled 2021-12-18: qty 90, 30d supply, fill #0

## 2021-12-18 MED ORDER — BETAMETHASONE DIPROPIONATE 0.05 % EX CREA
TOPICAL_CREAM | CUTANEOUS | 1 refills | Status: DC
  Filled 2021-12-18: qty 30, 15d supply, fill #0
  Filled 2022-02-22 – 2022-03-10 (×3): qty 30, 15d supply, fill #1

## 2021-12-18 MED ORDER — HYDROCHLOROTHIAZIDE 25 MG PO TABS
25.0000 mg | ORAL_TABLET | Freq: Every day | ORAL | 3 refills | Status: DC
  Filled 2021-12-18 – 2022-03-10 (×2): qty 30, 30d supply, fill #0

## 2021-12-18 MED ORDER — AMLODIPINE BESYLATE 10 MG PO TABS
10.0000 mg | ORAL_TABLET | Freq: Every day | ORAL | 0 refills | Status: DC
  Filled 2021-12-18: qty 30, 30d supply, fill #0
  Filled 2022-03-10: qty 30, 30d supply, fill #1

## 2021-12-18 MED ORDER — ASPIRIN 81 MG PO TBEC: 81.0000 mg | DELAYED_RELEASE_TABLET | Freq: Every day | ORAL | 12 refills | Status: DC

## 2021-12-18 MED ORDER — OMEPRAZOLE 40 MG PO CPDR
40.0000 mg | DELAYED_RELEASE_CAPSULE | Freq: Every day | ORAL | 2 refills | Status: DC
  Filled 2021-12-18: qty 30, 30d supply, fill #0

## 2021-12-18 MED ORDER — GABAPENTIN 600 MG PO TABS
600.0000 mg | ORAL_TABLET | Freq: Three times a day (TID) | ORAL | 1 refills | Status: DC
Start: 2021-12-18 — End: 2022-07-27
  Filled 2021-12-18: qty 90, 30d supply, fill #0
  Filled 2022-03-10: qty 90, 30d supply, fill #1

## 2021-12-18 MED ORDER — LISINOPRIL 40 MG PO TABS
ORAL_TABLET | ORAL | 0 refills | Status: DC
  Filled 2021-12-18: qty 30, 30d supply, fill #0

## 2021-12-18 MED ORDER — METFORMIN HCL 1000 MG PO TABS
1000.0000 mg | ORAL_TABLET | Freq: Two times a day (BID) | ORAL | 1 refills | Status: DC
  Filled 2021-12-18 – 2022-03-10 (×3): qty 60, 30d supply, fill #0

## 2021-12-18 MED ORDER — NITROGLYCERIN 0.4 MG SL SUBL
0.4000 mg | SUBLINGUAL_TABLET | SUBLINGUAL | 1 refills | Status: AC | PRN
  Filled 2021-12-18: qty 25, 15d supply, fill #0

## 2021-12-18 MED ORDER — METOPROLOL SUCCINATE ER 25 MG PO TB24
ORAL_TABLET | Freq: Every day | ORAL | 0 refills | Status: DC
  Filled 2021-12-18: qty 30, 30d supply, fill #0

## 2021-12-18 NOTE — Progress Notes (Signed)
Assessment & Plan:  Bradley Wolfe was seen today for diabetes, hypertension and hyperlipidemia.  Diagnoses and all orders for this visit:  Type 2 diabetes mellitus with hyperglycemia, without long-term current use of insulin (HCC) -     Hemoglobin A1c -     Ambulatory referral to Ophthalmology -     lisinopril (ZESTRIL) 40 MG tablet; TAKE 1 TABLET (40 MG TOTAL) BY MOUTH DAILY. MUST HAVE OFFICE VISIT FOR REFILLS -     metFORMIN (GLUCOPHAGE) 1000 MG tablet; Take 1 tablet (1,000 mg total) by mouth 2 (two) times daily with a meal. -     POCT glucose (manual entry)  Dyslipidemia, goal LDL below 70 -     Lipid panel  Essential hypertension -     metoprolol succinate (TOPROL-XL) 25 MG 24 hr tablet; TAKE 1 TABLET (25 MG TOTAL) BY MOUTH DAILY. -     lisinopril (ZESTRIL) 40 MG tablet; TAKE 1 TABLET (40 MG TOTAL) BY MOUTH DAILY. MUST HAVE OFFICE VISIT FOR REFILLS -     hydrochlorothiazide (HYDRODIURIL) 25 MG tablet; Take 1 tablet (25 mg total) by mouth daily. -     amLODipine (NORVASC) 10 MG tablet; Take 1 tablet (10 mg total) by mouth daily.  Coronary artery disease involving native coronary artery of native heart with other form of angina pectoris (HCC) -     nitroGLYCERIN (NITROSTAT) 0.4 MG SL tablet; Place 1 tablet (0.4 mg total) under the tongue every 5 (five) minutes as needed for chest pain. Report to ER if you have taken  pills or more in 15 minute timeframe  Psoriasis -     betamethasone dipropionate 0.05 % cream; APPLY TOPICALLY TWO TIMES DAILY. -     Ambulatory referral to Dermatology  Hyperlipidemia LDL goal <70 -     atorvastatin (LIPITOR) 80 MG tablet; Take 1 tablet (80 mg total) by mouth daily.  Diabetic polyneuropathy associated with type 2 diabetes mellitus (HCC) -     Discontinue: gabapentin (NEURONTIN) 300 MG capsule; Take 1 capsule (300 mg total) by mouth 3 (three) times daily. -     gabapentin (NEURONTIN) 600 MG tablet; Take 1 tablet (600 mg total) by mouth 3 (three) times  daily.  GERD without esophagitis -     omeprazole (PRILOSEC) 40 MG capsule; TAKE 1 CAPSULE (40 MG TOTAL) BY MOUTH DAILY. WILL PICK UP TODAY. -     Discontinue: omeprazole (PRILOSEC) 40 MG capsule; Take 1 capsule (40 mg total) by mouth daily. Will pick up today.  Colon cancer screening -     Ambulatory referral to Gastroenterology  Pain in both feet -     Uric Acid  Increased urinary frequency -     PSA  Need for shingles vaccine -     Varicella-zoster vaccine IM (Shingrix)  Other orders -     aspirin 81 MG EC tablet; Take 1 tablet (81 mg total) by mouth daily. Swallow whole.    Patient has been counseled on age-appropriate routine health concerns for screening and prevention. These are reviewed and up-to-date. Referrals have been placed accordingly. Immunizations are up-to-date or declined.    Subjective:   Chief Complaint  Patient presents with   Diabetes   Hypertension   Hyperlipidemia   HPI Bradley Wolfe. 51 y.o. male presents to office today for follow up to chronic conditions.  He has a past medical history of CAD (coronary artery disease), native coronary artery (12/21/12), Diabetes mellitus without complication (HCC), GERD (gastroesophageal reflux  disease), Hyperlipidemia, Hypertension, NSTEMI, Psoriasis, and Tobacco abuse.    Hypertension He is not exercising and is not adherent to low salt diet.  He does not have a blood pressure log  today.  Blood pressure is not well controlled at home. Cardiac risk factors:  diabetes mellitus, dyslipidemia, hypertension, and male gender Cardiac symptoms none.  Use of agents associated with hypertension: none.  History of target organ damage: angina/ prior myocardial infarction. BP Readings from Last 3 Encounters:  12/18/21 (!) 157/85  12/10/21 (!) 174/83  10/02/21 (!) 171/80    Hyperlipidemia Patient presents for follow up to hyperlipidemia.  He is medication compliant. He is diet compliant and denies chest pain,  dyspnea, and lower extremity edema or statin intolerance including myalgias.  Lab Results  Component Value Date   CHOL 134 12/25/2020   Lab Results  Component Value Date   HDL 64 12/25/2020   Lab Results  Component Value Date   LDLCALC 60 12/25/2020   Lab Results  Component Value Date   TRIG 42 12/25/2020   Lab Results  Component Value Date   CHOLHDL 2.1 12/25/2020      Diabetes Mellitus Type II Current symptoms/problems include hyperglycemia and have been stable.  Known diabetic complications: peripheral neuropathy  Current diabetic medications include: Metformin 1000 mg BID Eye exam current (within one year): no Weight trend: stable Prior visit with dietician: no Current monitoring regimen: none Any episodes of hypoglycemia? no Is He on ACE inhibitor or angiotensin II receptor blocker?  Yes  Lab Results  Component Value Date   HGBA1C 5.5 12/25/2020   HGBA1C 7.0 08/13/2020   HGBA1C 7.2 (A) 11/12/2019    Urinary symptoms  He reports recurrent urinary frequency, urinary hesitancy, and urinary urgency. The current episode started several months ago and is staying constant. Patient states symptoms are 7/10 in intensity, occurring every few hours. He  has not been recently treated for similar symptoms.    Associated symptoms: No abdominal pain No back pain  No chills No constipation  No cramping No diarrhea  No discharge No fever  No hematuria No nausea  No vomiting    ---------------------------------------------------------------------------------------     Review of Systems  Constitutional:  Negative for fever, malaise/fatigue and weight loss.  HENT: Negative.  Negative for nosebleeds.   Eyes: Negative.  Negative for blurred vision, double vision and photophobia.  Respiratory: Negative.  Negative for cough and shortness of breath.   Cardiovascular: Negative.  Negative for chest pain, palpitations and leg swelling.  Gastrointestinal: Negative.  Negative  for heartburn, nausea and vomiting.  Genitourinary:  Positive for frequency and urgency. Negative for dysuria, flank pain and hematuria.  Musculoskeletal: Negative.  Negative for myalgias.  Neurological:  Positive for tingling and sensory change. Negative for dizziness, focal weakness, seizures and headaches.  Psychiatric/Behavioral: Negative.  Negative for suicidal ideas.    Past Medical History:  Diagnosis Date   CAD (coronary artery disease), native coronary artery 12/21/12   a. NSTEMI 2/14 => LHC 12/21/12: mLAD 90%, OM1 20%, mRCA 100%, EF 55-60%.  PCI: Xience Xpedition DES to mRCA and Xience Xpedition DES to mLAD.    Diabetes mellitus without complication (HCC)    GERD (gastroesophageal reflux disease)    Hyperlipidemia    Hypertension    NSTEMI    Psoriasis    Tobacco abuse     Past Surgical History:  Procedure Laterality Date   CORONARY ANGIOPLASTY WITH STENT PLACEMENT  12/21/12   90% mid LAD s/p DES,  20% OM1, 100% mid RCA s/p DES; LVEF 55-60%   LEFT HEART CATHETERIZATION WITH CORONARY ANGIOGRAM N/A 12/21/2012   Procedure: LEFT HEART CATHETERIZATION WITH CORONARY ANGIOGRAM;  Surgeon: Burnell Blanks, MD;  Location: [facility] CATH LAB;  Service: Cardiovascular;  Laterality: N/A;    Family History  Problem Relation Age of Onset   Heart attack Father    Heart attack Mother     Social History Reviewed with no changes to be made today.   Outpatient Medications Prior to Visit  Medication Sig Dispense Refill   acetaminophen (TYLENOL) 500 MG tablet Take 2 tablets (1,000 mg total) by mouth every 8 (eight) hours as needed for moderate pain. 30 tablet 0   albuterol (VENTOLIN HFA) 108 (90 Base) MCG/ACT inhaler Inhale 2 puffs into the lungs every 6 (six) hours as needed for wheezing or shortness of breath. 18 g 1   Blood Glucose Monitoring Suppl (TRUE METRIX METER) w/Device KIT Needs new meter. We tested the his current one with a new battery and it is defective. Patient will pick up  scripts today. 1 kit 0   Coal Tar Extract (PSORIASIN EX) Apply 1 application topically as needed (for psoriasis).     glucose blood test strip Use as instructed 100 each 12   TRUEplus Lancets 26G MISC 1 each by Does not apply route every 8 (eight) hours as needed. 100 each 12   amLODipine (NORVASC) 10 MG tablet TAKE 1 TABLET (10 MG TOTAL) BY MOUTH DAILY. 90 tablet 0   atorvastatin (LIPITOR) 80 MG tablet TAKE 1 TABLET (80 MG TOTAL) BY MOUTH DAILY. 90 tablet 0   betamethasone dipropionate 0.05 % cream APPLY TOPICALLY TWO TIMES DAILY. PATIENT WILL PICK UP SCRIPTS TODAY. 45 g 1   betamethasone dipropionate 0.05 % cream APPLY TOPICALLY TWO TIMES DAILY. 45 g 1   gabapentin (NEURONTIN) 300 MG capsule TAKE 1 CAPSULE (300 MG TOTAL) BY MOUTH 3 (THREE) TIMES DAILY. 90 capsule 1   hydrochlorothiazide (HYDRODIURIL) 25 MG tablet Take 1 tablet (25 mg total) by mouth daily. 90 tablet 3   lisinopril (ZESTRIL) 40 MG tablet TAKE 1 TABLET (40 MG TOTAL) BY MOUTH DAILY. MUST HAVE OFFICE VISIT FOR REFILLS 30 tablet 0   metFORMIN (GLUCOPHAGE) 1000 MG tablet TAKE 1 TABLET (1,000 MG TOTAL) BY MOUTH 2 (TWO) TIMES DAILY WITH A MEAL. 60 tablet 0   metoprolol succinate (TOPROL-XL) 25 MG 24 hr tablet TAKE 1 TABLET (25 MG TOTAL) BY MOUTH DAILY. 30 tablet 0   nitroGLYCERIN (NITROSTAT) 0.4 MG SL tablet DISSOLVE ONE TABLET UNDER THE TONGUE EVERY 5 MINUTES AS NEEDED FOR CHEST PAIN. DO NOT EXCEED A TOTAL OF 3 DOSES IN 15 MINUTES 25 tablet 1   omeprazole (PRILOSEC) 40 MG capsule TAKE 1 CAPSULE (40 MG TOTAL) BY MOUTH DAILY. WILL PICK UP TODAY. 90 capsule 0   amLODipine (NORVASC) 10 MG tablet Take 1 tablet (10 mg total) by mouth daily. 90 tablet 0   atorvastatin (LIPITOR) 80 MG tablet Take 1 tablet (80 mg total) by mouth daily. 90 tablet 2   gabapentin (NEURONTIN) 300 MG capsule Take 1 capsule (300 mg total) by mouth 3 (three) times daily. 90 capsule 2   metFORMIN (GLUCOPHAGE) 1000 MG tablet Take 1 tablet (1,000 mg total) by mouth 2  (two) times daily with a meal. 180 tablet 1   omeprazole (PRILOSEC) 40 MG capsule Take 1 capsule (40 mg total) by mouth daily. Will pick up today. 90 capsule 2   No facility-administered medications  prior to visit.    No Known Allergies     Objective:    BP (!) 157/85    Pulse (!) 57    Resp 18    Ht $R'5\' 8"'sI$  (1.727 m)    Wt 199 lb 4 oz (90.4 kg)    SpO2 99%    BMI 30.30 kg/m  Wt Readings from Last 3 Encounters:  12/18/21 199 lb 4 oz (90.4 kg)  12/09/21 169 lb 15.6 oz (77.1 kg)  10/02/21 170 lb (77.1 kg)    Physical Exam Vitals and nursing note reviewed.  Constitutional:      Appearance: He is well-developed.  HENT:     Head: Normocephalic and atraumatic.  Cardiovascular:     Rate and Rhythm: Normal rate and regular rhythm.     Heart sounds: Normal heart sounds. No murmur heard.   No friction rub. No gallop.  Pulmonary:     Effort: Pulmonary effort is normal. No tachypnea or respiratory distress.     Breath sounds: Normal breath sounds. No decreased breath sounds, wheezing, rhonchi or rales.  Chest:     Chest wall: No tenderness.  Abdominal:     General: Bowel sounds are normal.     Palpations: Abdomen is soft.  Musculoskeletal:        General: Normal range of motion.     Cervical back: Normal range of motion.  Skin:    General: Skin is warm and dry.  Neurological:     Mental Status: He is alert and oriented to person, place, and time.     Coordination: Coordination normal.  Psychiatric:        Behavior: Behavior normal. Behavior is cooperative.        Thought Content: Thought content normal.        Judgment: Judgment normal.         Patient has been counseled extensively about nutrition and exercise as well as the importance of adherence with medications and regular follow-up. The patient was given clear instructions to go to ER or return to medical center if symptoms don't improve, worsen or new problems develop. The patient verbalized understanding.    Follow-up: Return in about 3 months (around 03/17/2022).   Gildardo Pounds, FNP-BC [facility] and [facility] [place], [place]   12/18/2021, 9:44 AM

## 2021-12-19 LAB — LIPID PANEL
Chol/HDL Ratio: 1.8 ratio (ref 0.0–5.0)
Cholesterol, Total: 125 mg/dL (ref 100–199)
HDL: 71 mg/dL (ref >39)
LDL Chol Calc (NIH): 42 mg/dL (ref 0–99)
Triglycerides: 49 mg/dL (ref 0–149)
VLDL Cholesterol Cal: 12 mg/dL (ref 5–40)

## 2021-12-19 LAB — HEMOGLOBIN A1C
Est. average glucose Bld gHb Est-mCnc: 111 mg/dL
Hgb A1c MFr Bld: 5.5 % (ref 4.8–5.6)

## 2021-12-19 LAB — PSA: Prostate Specific Ag, Serum: 0.5 ng/mL (ref 0.0–4.0)

## 2021-12-19 LAB — URIC ACID: Uric Acid: 5 mg/dL (ref 3.8–8.4)

## 2021-12-22 ENCOUNTER — Telehealth: Payer: Self-pay

## 2021-12-22 NOTE — Telephone Encounter (Signed)
Called pt just rang and not vm to leave a message to return call to office.

## 2021-12-24 ENCOUNTER — Telehealth: Payer: Self-pay

## 2021-12-24 NOTE — Telephone Encounter (Signed)
Called patient reviewed all information and repeated back to me. Will call if any questions.  ? ?

## 2022-01-05 ENCOUNTER — Emergency Department (HOSPITAL_COMMUNITY)
Admission: EM | Admit: 2022-01-05 | Discharge: 2022-01-05 | Disposition: A | Discharge status: Home / Self Care | Payer: 59 | Attending: Emergency Medicine | Admitting: Emergency Medicine

## 2022-01-05 ENCOUNTER — Encounter (HOSPITAL_COMMUNITY): Payer: Self-pay

## 2022-01-05 ENCOUNTER — Other Ambulatory Visit: Payer: Self-pay

## 2022-01-05 DIAGNOSIS — Z7982 Long term (current) use of aspirin: Secondary | ICD-10-CM | POA: Diagnosis not present

## 2022-01-05 DIAGNOSIS — K0889 Other specified disorders of teeth and supporting structures: Secondary | ICD-10-CM | POA: Diagnosis present

## 2022-01-05 DIAGNOSIS — Z79899 Other long term (current) drug therapy: Secondary | ICD-10-CM | POA: Diagnosis not present

## 2022-01-05 DIAGNOSIS — Z7984 Long term (current) use of oral hypoglycemic drugs: Secondary | ICD-10-CM | POA: Diagnosis not present

## 2022-01-05 MED ORDER — AMOXICILLIN 500 MG PO CAPS: 500.0000 mg | ORAL_CAPSULE | Freq: Three times a day (TID) | ORAL | 0 refills | Status: DC

## 2022-01-05 NOTE — ED Provider Notes (Signed)
?[facility] [facility] DEPT ?Provider Note ? ? ?CSN: [id number] ?Arrival date & time: 01/05/22  2323 ? ?  ? ?History ? ?Chief Complaint  ?Patient presents with  ? Dental Pain  ? ? ?Bradley Wolfe. is a 51 y.o. male who presents to the ED today with complaint of gradual onset, constant, achy, left lower dental pain for the past 3 days.  Patient reports he used to have a filling in this area that fell out several months ago and believes that this could be causing his pain.  He reports sensitivity with drinking cold liquids and cold air hitting tooth.  He does not have a dentist to follow-up with at this time.  He has been taking ibuprofen earlier yesterday and today without much relief.  He did pick up Tylenol earlier today and took 1000 mg around 7 PM.  He states that this has helped his pain and it is somewhat resolved at this time however came to the ED for further evaluation anyways.  Denies any fevers, chills, foul taste in mouth, difficulty swallowing, any other associated symptoms. ? ?The history is provided by the patient and medical records.  ? ?  ? ?Home Medications ?Prior to Admission medications   ?Medication Sig Start Date End Date Taking? Authorizing Provider  ?amoxicillin (AMOXIL) 500 MG capsule Take 1 capsule (500 mg total) by mouth 3 (three) times daily. 01/05/22  Yes Dawnmarie Breon, PA-C  ?acetaminophen (TYLENOL) 500 MG tablet Take 2 tablets (1,000 mg total) by mouth every 8 (eight) hours as needed for moderate pain. 07/01/17   Alfonse Spruce, FNP  ?albuterol (VENTOLIN HFA) 108 (90 Base) MCG/ACT inhaler Inhale 2 puffs into the lungs every 6 (six) hours as needed for wheezing or shortness of breath. 10/14/21   Gildardo Pounds, NP  ?amLODipine (NORVASC) 10 MG tablet Take 1 tablet (10 mg total) by mouth daily. 12/18/21 03/18/22  Gildardo Pounds, NP  ?aspirin 81 MG EC tablet Take 1 tablet (81 mg total) by mouth daily. Swallow whole. 12/18/21   Gildardo Pounds, NP   ?atorvastatin (LIPITOR) 80 MG tablet Take 1 tablet (80 mg total) by mouth daily. 12/18/21 03/18/22  Gildardo Pounds, NP  ?betamethasone dipropionate 0.05 % cream APPLY TOPICALLY TWO TIMES DAILY. 12/18/21 12/18/22  Gildardo Pounds, NP  ?Blood Glucose Monitoring Suppl (TRUE METRIX METER) w/Device KIT Needs new meter. We tested the his current one with a new battery and it is defective. Patient will pick up scripts today. 04/25/19   Gildardo Pounds, NP  ?Coal Tar Extract (PSORIASIN EX) Apply 1 application topically as needed (for psoriasis).    [provider]  ?gabapentin (NEURONTIN) 600 MG tablet Take 1 tablet (600 mg total) by mouth 3 (three) times daily. 12/18/21 03/18/22  Gildardo Pounds, NP  ?glucose blood test strip Use as instructed 08/13/20   Elsie Stain, MD  ?hydrochlorothiazide (HYDRODIURIL) 25 MG tablet Take 1 tablet (25 mg total) by mouth daily. 12/18/21   Gildardo Pounds, NP  ?lisinopril (ZESTRIL) 40 MG tablet TAKE 1 TABLET (40 MG TOTAL) BY MOUTH DAILY. MUST HAVE OFFICE VISIT FOR REFILLS 12/18/21 12/18/22  Gildardo Pounds, NP  ?metFORMIN (GLUCOPHAGE) 1000 MG tablet Take 1 tablet (1,000 mg total) by mouth 2 (two) times daily with a meal. 12/18/21 03/18/22  Gildardo Pounds, NP  ?metoprolol succinate (TOPROL-XL) 25 MG 24 hr tablet TAKE 1 TABLET (25 MG TOTAL) BY MOUTH DAILY. 12/18/21 12/18/22  Gildardo Pounds,  NP  ?nitroGLYCERIN (NITROSTAT) 0.4 MG SL tablet Place 1 tablet (0.4 mg total) under the tongue every 5 (five) minutes as needed for chest pain. Report to ER if you have taken  pills or more in 15 minute timeframe 12/18/21   Gildardo Pounds, NP  ?omeprazole (PRILOSEC) 40 MG capsule TAKE 1 CAPSULE (40 MG TOTAL) BY MOUTH DAILY. WILL PICK UP TODAY. 12/18/21 12/18/22  Gildardo Pounds, NP  ?TRUEplus Lancets 26G MISC 1 each by Does not apply route every 8 (eight) hours as needed. 08/13/20   Elsie Stain, MD  ?   ? ?Allergies    ?Patient has no known allergies.   ? ?Review of Systems   ?Review of  Systems  ?Constitutional:  Negative for chills and fever.  ?HENT:  Positive for dental problem. Negative for facial swelling.   ? ?Physical Exam ?Updated Vital Signs ?BP (!) 162/82   Pulse (!) 59   Temp 98.2 ?F (36.8 ?C)   Resp 18   SpO2 100%  ?Physical Exam ?Vitals and nursing note reviewed.  ?Constitutional:   ?   Appearance: He is not ill-appearing.  ?HENT:  ?   Head: Normocephalic and atraumatic.  ?   Mouth/Throat:  ?   Comments: Nose clear.  ?L lower tooth #20 decayed with caries with  TTP, with minimal surrounding gingival swelling and erythema, no definite abscess, no evidence of ludwig's.  ?Oropharynx clear and moist, without uvular swelling or deviation, no trismus or drooling, no tonsillar swelling or erythema, no exudates.   ? ? ?Eyes:  ?   Conjunctiva/sclera: Conjunctivae normal.  ?Cardiovascular:  ?   Rate and Rhythm: Normal rate and regular rhythm.  ?Pulmonary:  ?   Effort: Pulmonary effort is normal.  ?   Breath sounds: Normal breath sounds.  ?Skin: ?   General: Skin is warm and dry.  ?   Coloration: Skin is not jaundiced.  ?Neurological:  ?   Mental Status: He is alert.  ? ? ?ED Results / Procedures / Treatments   ?Labs ?(all labs ordered are listed, but only abnormal results are displayed) ?Labs Reviewed - No data to display ? ?EKG ?None ? ?Radiology ?No results found. ? ?Procedures ?Procedures  ? ? ?Medications Ordered in ED ?Medications - No data to display ? ?ED Course/ Medical Decision Making/ A&P ?  ?                        ?Medical Decision Making ?51 year old male who presents to the ED today with complaint of left lower dental pain for the past 3 days.  On arrival to the ED vitals are stable.  Patient appears to be no acute distress.  He is noted to have dental decay and gingival erythema to tooth #20 on the left side.  He reports he used to have a filling in this area that has fallen out in the past several months.  Suspect this is related to pain.  We will plan to treat with  amoxicillin.  Patient advised to continue taking ibuprofen and Tylenol, alternate with dentistry follow-up.  He is in agreement with plan and stable for discharge. ? ?Problems Addressed: ?Pain, dental: acute illness or injury ? ? ? ? ? ? ? ? ? ?Final Clinical Impression(s) / ED Diagnoses ?Final diagnoses:  ?Pain, dental  ? ? ?Rx / DC Orders ?ED Discharge Orders   ? ?      Ordered  ?  amoxicillin (AMOXIL)  500 MG capsule  3 times daily       ? 01/05/22 2347  ? ?  ?  ? ?  ? ? ? ?Discharge Instructions   ? ?  ?Please pick up antibiotic and take as prescribed ? ?You can continue alternating Ibuprofen and Tylenol. You can take 4 OTC tablets of Ibuprofen (800 mg) every 8 hours and 2 OTC tablets of Tylenol (1,000 mg) every 8 hours. Alternate each medication every 4 hours to stay on top of the pain.  ? ?You can follow up with Dr. [facility]. Attached is an additional Arts development officer.  ? ?Return to the ED for any new/worsening symptoms ? ? ? ? ?  ?Eustaquio Maize, PA-C ?01/05/22 2348 ? ?  ?Valarie Merino, MD ?01/06/22 1613 ? ?

## 2022-01-05 NOTE — ED Triage Notes (Signed)
Left lower molar filling fell out. Dental  pain x 3 days.  ?

## 2022-01-05 NOTE — Discharge Instructions (Addendum)
Please pick up antibiotic and take as prescribed ? ?You can continue alternating Ibuprofen and Tylenol. You can take 4 OTC tablets of Ibuprofen (800 mg) every 8 hours and 2 OTC tablets of Tylenol (1,000 mg) every 8 hours. Alternate each medication every 4 hours to stay on top of the pain.  ? ?You can follow up with [facility]. [facility]. Attached is an additional Designer, jewellery.  ? ?Return to the ED for any new/worsening symptoms ?

## 2022-01-30 ENCOUNTER — Encounter (HOSPITAL_COMMUNITY): Payer: Self-pay | Admitting: *Deleted

## 2022-01-30 ENCOUNTER — Other Ambulatory Visit: Payer: Self-pay

## 2022-01-30 ENCOUNTER — Emergency Department (HOSPITAL_COMMUNITY)
Admission: EM | Admit: 2022-01-30 | Discharge: 2022-01-30 | Disposition: A | Discharge status: Home / Self Care | Payer: 59 | Attending: Emergency Medicine | Admitting: Emergency Medicine

## 2022-01-30 DIAGNOSIS — I10 Essential (primary) hypertension: Secondary | ICD-10-CM | POA: Insufficient documentation

## 2022-01-30 DIAGNOSIS — Z7984 Long term (current) use of oral hypoglycemic drugs: Secondary | ICD-10-CM | POA: Insufficient documentation

## 2022-01-30 DIAGNOSIS — K0889 Other specified disorders of teeth and supporting structures: Secondary | ICD-10-CM

## 2022-01-30 DIAGNOSIS — I2510 Atherosclerotic heart disease of native coronary artery without angina pectoris: Secondary | ICD-10-CM | POA: Diagnosis not present

## 2022-01-30 DIAGNOSIS — Z79899 Other long term (current) drug therapy: Secondary | ICD-10-CM | POA: Diagnosis not present

## 2022-01-30 DIAGNOSIS — K029 Dental caries, unspecified: Secondary | ICD-10-CM | POA: Diagnosis not present

## 2022-01-30 DIAGNOSIS — Z7982 Long term (current) use of aspirin: Secondary | ICD-10-CM | POA: Insufficient documentation

## 2022-01-30 DIAGNOSIS — E119 Type 2 diabetes mellitus without complications: Secondary | ICD-10-CM | POA: Insufficient documentation

## 2022-01-30 MED ORDER — LIDOCAINE VISCOUS HCL 2 % MT SOLN
15.0000 mL | Freq: Once | OROMUCOSAL | Status: AC
  Administered 2022-01-30: 15 mL via OROMUCOSAL
  Filled 2022-01-30: qty 15

## 2022-01-30 MED ORDER — AMOXICILLIN-POT CLAVULANATE 875-125 MG PO TABS
1.0000 | ORAL_TABLET | Freq: Two times a day (BID) | ORAL | 0 refills | Status: DC
Start: 2022-01-30 — End: 2022-03-17

## 2022-01-30 MED ORDER — LIDOCAINE VISCOUS HCL 2 % MT SOLN
15.0000 mL | OROMUCOSAL | 0 refills | Status: DC | PRN
Start: 2022-01-30 — End: 2023-07-11

## 2022-01-30 NOTE — ED Triage Notes (Signed)
Lower left dental pain, pt has been treated for in March, Dental appointment May 16th to follow up. ?

## 2022-01-30 NOTE — ED Provider Notes (Signed)
?[facility] DEPT ?Provider Note ? ? ?CSN: [id number] ?Arrival date & time: 01/30/22  0857 ? ?  ? ?History ? ?Chief Complaint  ?Patient presents with  ? Dental Problem  ? ? ?Bradley Wolfe. is a 51 y.o. male type 2 diabetes, hypertension, CAD, hyperlipidemia.  Presents emergency department with a complaint of left lower dental pain.  Patient reports that pain started last night and has been consistent since then.  Patient reports that he has been taking Tylenol and ibuprofen with minimal relief of his symptoms.  Patient states that the area has increased sensitivity to cold.  Patient denies any known dental injuries recently states that he did have a failed pain that fell of a tooth multiple months prior.  Patient states that he has dental appointment scheduled for May 16.  ? ?Denies any drooling, trismus, high potato voice, sore throat, trouble swallowing, facial swelling, swelling to neck, neck pain, neck stiffness, nausea, vomiting, trouble breathing. ? ?HPI ? ?  ? ?Home Medications ?Prior to Admission medications   ?Medication Sig Start Date End Date Taking? Authorizing Provider  ?acetaminophen (TYLENOL) 500 MG tablet Take 2 tablets (1,000 mg total) by mouth every 8 (eight) hours as needed for moderate pain. 07/01/17   Alfonse Spruce, FNP  ?albuterol (VENTOLIN HFA) 108 (90 Base) MCG/ACT inhaler Inhale 2 puffs into the lungs every 6 (six) hours as needed for wheezing or shortness of breath. 10/14/21   Gildardo Pounds, NP  ?amLODipine (NORVASC) 10 MG tablet Take 1 tablet (10 mg total) by mouth daily. 12/18/21 03/18/22  Gildardo Pounds, NP  ?amoxicillin (AMOXIL) 500 MG capsule Take 1 capsule (500 mg total) by mouth 3 (three) times daily. 01/05/22   Eustaquio Maize, PA-C  ?aspirin 81 MG EC tablet Take 1 tablet (81 mg total) by mouth daily. Swallow whole. 12/18/21   Gildardo Pounds, NP  ?atorvastatin (LIPITOR) 80 MG tablet Take 1 tablet (80 mg total) by mouth daily. 12/18/21  03/18/22  Gildardo Pounds, NP  ?betamethasone dipropionate 0.05 % cream APPLY TOPICALLY TWO TIMES DAILY. 12/18/21 12/18/22  Gildardo Pounds, NP  ?Blood Glucose Monitoring Suppl (TRUE METRIX METER) w/Device KIT Needs new meter. We tested the his current one with a new battery and it is defective. Patient will pick up scripts today. 04/25/19   Gildardo Pounds, NP  ?Coal Tar Extract (PSORIASIN EX) Apply 1 application topically as needed (for psoriasis).    [provider]  ?gabapentin (NEURONTIN) 600 MG tablet Take 1 tablet (600 mg total) by mouth 3 (three) times daily. 12/18/21 03/18/22  Gildardo Pounds, NP  ?glucose blood test strip Use as instructed 08/13/20   Elsie Stain, MD  ?hydrochlorothiazide (HYDRODIURIL) 25 MG tablet Take 1 tablet (25 mg total) by mouth daily. 12/18/21   Gildardo Pounds, NP  ?lisinopril (ZESTRIL) 40 MG tablet TAKE 1 TABLET (40 MG TOTAL) BY MOUTH DAILY. MUST HAVE OFFICE VISIT FOR REFILLS 12/18/21 12/18/22  Gildardo Pounds, NP  ?metFORMIN (GLUCOPHAGE) 1000 MG tablet Take 1 tablet (1,000 mg total) by mouth 2 (two) times daily with a meal. 12/18/21 03/18/22  Gildardo Pounds, NP  ?metoprolol succinate (TOPROL-XL) 25 MG 24 hr tablet TAKE 1 TABLET (25 MG TOTAL) BY MOUTH DAILY. 12/18/21 12/18/22  Gildardo Pounds, NP  ?nitroGLYCERIN (NITROSTAT) 0.4 MG SL tablet Place 1 tablet (0.4 mg total) under the tongue every 5 (five) minutes as needed for chest pain. Report to ER if you have  taken  pills or more in 15 minute timeframe 12/18/21   Gildardo Pounds, NP  ?omeprazole (PRILOSEC) 40 MG capsule TAKE 1 CAPSULE (40 MG TOTAL) BY MOUTH DAILY. WILL PICK UP TODAY. 12/18/21 12/18/22  Gildardo Pounds, NP  ?TRUEplus Lancets 26G MISC 1 each by Does not apply route every 8 (eight) hours as needed. 08/13/20   Elsie Stain, MD  ?   ? ?Allergies    ?Patient has no known allergies.   ? ?Review of Systems   ?Review of Systems  ?Constitutional:  Negative for chills and fever.  ?HENT:  Positive for dental  problem. Negative for drooling, facial swelling, sore throat, trouble swallowing and voice change.   ?Eyes:  Negative for visual disturbance.  ?Respiratory:  Negative for shortness of breath.   ?Gastrointestinal:  Negative for nausea and vomiting.  ?Musculoskeletal:  Negative for neck pain and neck stiffness.  ? ?Physical Exam ?Updated Vital Signs ?BP (!) 193/91 (BP Location: Right Arm)   Pulse (!) 58   Temp 98.4 ?F (36.9 ?C)   Resp 18   Ht $R'5\' 8"'SU$  (1.727 m)   Wt 79.4 kg   SpO2 100%   BMI 26.61 kg/m?  ?Physical Exam ?Vitals and nursing note reviewed.  ?Constitutional:   ?   General: He is not in acute distress. ?   Appearance: He is not ill-appearing, toxic-appearing or diaphoretic.  ?HENT:  ?   Head: Normocephalic.  ?   Jaw: No trismus, tenderness, swelling, pain on movement or malocclusion.  ?   Salivary Glands: Right salivary gland is not diffusely enlarged or tender. Left salivary gland is not diffusely enlarged or tender.  ?   Comments: No facial swelling ?   Mouth/Throat:  ?   Lips: Pink. No lesions.  ?   Mouth: Mucous membranes are moist. No lacerations or oral lesions.  ?   Dentition: Abnormal dentition. Dental caries present. No dental tenderness or dental abscesses.  ?   Tongue: No lesions. Tongue does not deviate from midline.  ?   Palate: No mass.  ?   Pharynx: Oropharynx is clear. Uvula midline. No pharyngeal swelling, oropharyngeal exudate, posterior oropharyngeal erythema or uvula swelling.  ?   Tonsils: No tonsillar exudate or tonsillar abscesses. 1+ on the right. 1+ on the left.  ?   Comments: Patient has poor dentition with multiple missing teeth and dental caries.  Patient is missing #18 tooth.  #19 tooth appears chipped with dental carry visible.  Handles oral secretions without difficulty. ?Eyes:  ?   General: No scleral icterus.    ?   Right eye: No discharge.     ?   Left eye: No discharge.  ?Neck:  ?   Comments: No swelling to submandibular space ?Cardiovascular:  ?   Rate and Rhythm:  Normal rate.  ?Pulmonary:  ?   Effort: Pulmonary effort is normal.  ?Musculoskeletal:  ?   Cervical back: Full passive range of motion without pain, normal range of motion and neck supple. No edema, erythema, signs of trauma, rigidity, torticollis or crepitus. No pain with movement, spinous process tenderness or muscular tenderness. Normal range of motion.  ?Skin: ?   General: Skin is warm and dry.  ?Neurological:  ?   General: No focal deficit present.  ?   Mental Status: He is alert.  ?   GCS: GCS eye subscore is 4. GCS verbal subscore is 5. GCS motor subscore is 6.  ?Psychiatric:     ?  Behavior: Behavior is cooperative.  ? ? ?ED Results / Procedures / Treatments   ?Labs ?(all labs ordered are listed, but only abnormal results are displayed) ?Labs Reviewed - No data to display ? ?EKG ?None ? ?Radiology ?No results found. ? ?Procedures ?Procedures  ? ? ?Medications Ordered in ED ?Medications - No data to display ? ?ED Course/ Medical Decision Making/ A&P ?  ?                        ?Medical Decision Making ?Risk ?Prescription drug management. ? ? ?Alert 51 year old male in no acute distress, nontoxic-appearing.  Presents to the emergency department with a chief complaint of left lower dental pain. ? ?Information obtained from patient.  Past medical records reviewed including previous provider notes and labs.  Patient has medical history as outlined in HPI that complicates his care. ? ?Per chart review patient was seen on 01/05/2022 for similar complaints of dental pain.  Patient was treated with a course of amoxicillin. ? ?On exam patient has no dental abscess.  Patient does have poor dentition with multiple dental caries and missing teeth.  CT of face and neck were considered however patient has no facial swelling, swelling to submandibular space, pain with passive range of motion of neck, trismus, hot potato voice, neck stiffness, fever, chills; low suspicion for deep space neck infection or maxillofacial  abscess at this time. ? ?We will treat patient with course of amoxicillin.  Patient given viscous lidocaine for pain relief.  Patient to follow-up with dentist in outpatient setting. ? ?Discussed results, findings, treatment

## 2022-01-30 NOTE — Discharge Instructions (Addendum)
You came to the emergency department today to be evaluated for your dental pain.  Your physical exam was reassuring.  Due to the concern for possible dental infection I have started you on the antibiotic Augmentin please take this medication as prescribed.  Additionally have given you viscous lidocaine to help with your pain.  You may swish this medication around your mouth and spit it out.  If you swallow this medication please only take it every 6 hours.  If you do not swallow you can use it as frequently as needed. ? ?Please follow-up with your dentist in the outpatient setting. ? ?Please take Ibuprofen (Advil, motrin) and Tylenol (acetaminophen) to relieve your pain.   ? ?You may take up to 600 MG (3 pills) of normal strength ibuprofen every 8 hours as needed.   ?You make take tylenol, up to 1,000 mg (two extra strength pills) every 8 hours as needed.  ? ?It is safe to take ibuprofen and tylenol at the same time as they work differently.  ? Do not take more than 3,000 mg tylenol in a 24 hour period (not more than one dose every 8 hours.  Please check all medication labels as many medications such as pain and cold medications may contain tylenol.  Do not drink alcohol while taking these medications.  Do not take other NSAID'S while taking ibuprofen (such as aleve or naproxen).  Please take ibuprofen with food to decrease stomach upset. ? ?You may have diarrhea from the antibiotics.  It is very important that you continue to take the antibiotics even if you get diarrhea unless a medical professional tells you that you may stop taking them.  If you stop too early the bacteria you are being treated for will become stronger and you may need different, more powerful antibiotics that have more side effects and worsening diarrhea.  Please stay well hydrated and consider probiotics as they may decrease the severity of your diarrhea.  ? ?Get help right away if: ?You are unable to open your mouth. ?You are having trouble  breathing or swallowing. ?You have a fever. ?You notice that your face, neck, or jaw is swollen. ?

## 2022-02-22 ENCOUNTER — Other Ambulatory Visit: Payer: Self-pay

## 2022-03-01 ENCOUNTER — Other Ambulatory Visit: Payer: Self-pay

## 2022-03-09 ENCOUNTER — Telehealth: Payer: Self-pay | Admitting: Pharmacist

## 2022-03-09 NOTE — Telephone Encounter (Signed)
Patient called by Zella Ball, PharmD Candidate today. The patient did indicate some financial concerns with his antihypertensive medications, however, patient was at a dentist appointment and could not talk. Shari Prows attempted to call the patient back twice and was unable to leave a voicemail.  ? ?Will collaborate with team to outreach again  ? ?Catie Eppie Gibson, PharmD, BCACP ?[facility] ?[phone] ? ?

## 2022-03-10 ENCOUNTER — Other Ambulatory Visit: Payer: Self-pay | Admitting: Pharmacist

## 2022-03-10 ENCOUNTER — Other Ambulatory Visit: Payer: Self-pay

## 2022-03-10 DIAGNOSIS — E1165 Type 2 diabetes mellitus with hyperglycemia: Secondary | ICD-10-CM

## 2022-03-10 DIAGNOSIS — I10 Essential (primary) hypertension: Secondary | ICD-10-CM

## 2022-03-10 DIAGNOSIS — E785 Hyperlipidemia, unspecified: Secondary | ICD-10-CM

## 2022-03-10 DIAGNOSIS — K219 Gastro-esophageal reflux disease without esophagitis: Secondary | ICD-10-CM

## 2022-03-10 MED ORDER — AMLODIPINE BESYLATE 10 MG PO TABS
10.0000 mg | ORAL_TABLET | Freq: Every day | ORAL | 0 refills | Status: DC
  Filled 2022-03-10: qty 30, 30d supply, fill #0

## 2022-03-10 MED ORDER — LISINOPRIL 40 MG PO TABS
ORAL_TABLET | ORAL | 0 refills | Status: DC
  Filled 2022-03-10: qty 30, 30d supply, fill #0

## 2022-03-10 MED ORDER — OMEPRAZOLE 40 MG PO CPDR
40.0000 mg | DELAYED_RELEASE_CAPSULE | Freq: Every day | ORAL | 0 refills | Status: DC
  Filled 2022-03-10: qty 30, 30d supply, fill #0

## 2022-03-10 MED ORDER — ATORVASTATIN CALCIUM 80 MG PO TABS
80.0000 mg | ORAL_TABLET | Freq: Every day | ORAL | 0 refills | Status: DC
  Filled 2022-03-10: qty 30, 30d supply, fill #0

## 2022-03-10 MED ORDER — METOPROLOL SUCCINATE ER 25 MG PO TB24
ORAL_TABLET | Freq: Every day | ORAL | 0 refills | Status: DC
  Filled 2022-03-10: qty 30, 30d supply, fill #0

## 2022-03-10 NOTE — Telephone Encounter (Signed)
Attempted telephonic outreach again. Voicemail left.  ?

## 2022-03-16 ENCOUNTER — Other Ambulatory Visit: Payer: Self-pay | Admitting: Pharmacist

## 2022-03-16 ENCOUNTER — Other Ambulatory Visit: Payer: Self-pay

## 2022-03-17 ENCOUNTER — Encounter: Payer: Self-pay | Admitting: Nurse Practitioner

## 2022-03-17 ENCOUNTER — Ambulatory Visit: Payer: 59 | Attending: Nurse Practitioner | Admitting: Nurse Practitioner

## 2022-03-17 ENCOUNTER — Other Ambulatory Visit: Payer: Self-pay

## 2022-03-17 DIAGNOSIS — I10 Essential (primary) hypertension: Secondary | ICD-10-CM

## 2022-03-17 DIAGNOSIS — E785 Hyperlipidemia, unspecified: Secondary | ICD-10-CM | POA: Diagnosis not present

## 2022-03-17 DIAGNOSIS — K219 Gastro-esophageal reflux disease without esophagitis: Secondary | ICD-10-CM

## 2022-03-17 DIAGNOSIS — E1165 Type 2 diabetes mellitus with hyperglycemia: Secondary | ICD-10-CM | POA: Diagnosis not present

## 2022-03-17 MED ORDER — OMEPRAZOLE 40 MG PO CPDR
40.0000 mg | DELAYED_RELEASE_CAPSULE | Freq: Every day | ORAL | 1 refills | Status: DC
  Filled 2022-03-17: qty 90, 90d supply, fill #0

## 2022-03-17 MED ORDER — ATORVASTATIN CALCIUM 80 MG PO TABS
80.0000 mg | ORAL_TABLET | Freq: Every day | ORAL | 1 refills | Status: DC
  Filled 2022-03-17: qty 90, 90d supply, fill #0

## 2022-03-17 MED ORDER — LISINOPRIL 40 MG PO TABS
40.0000 mg | ORAL_TABLET | Freq: Every day | ORAL | 1 refills | Status: DC
Start: 2022-03-17 — End: 2022-07-27
  Filled 2022-03-17: qty 90, 90d supply, fill #0

## 2022-03-17 NOTE — Progress Notes (Signed)
Assessment & Plan:  Bradley Wolfe was seen today for hypertension.  Diagnoses and all orders for this visit:  Essential hypertension -     CMP14+EGFR -     lisinopril (ZESTRIL) 40 MG tablet; Take 1 tablet (40 mg total) by mouth daily. NEEDS PASS. Please fill as a 90 day supply  Type 2 diabetes mellitus with hyperglycemia, without long-term current use of insulin (HCC) -     lisinopril (ZESTRIL) 40 MG tablet; Take 1 tablet (40 mg total) by mouth daily. NEEDS PASS. Please fill as a 90 day supply  Hyperlipidemia LDL goal <70 -     atorvastatin (LIPITOR) 80 MG tablet; Take 1 tablet (80 mg total) by mouth daily. Please fill as a 90 day supply. NEEDS PASS  GERD without esophagitis -     omeprazole (PRILOSEC) 40 MG capsule; Take 1 capsule (40 mg total) by mouth daily. NEEDS PASS.  Please fill as a 90 day supply    Patient has been counseled on age-appropriate routine health concerns for screening and prevention. These are reviewed and up-to-date. Referrals have been placed accordingly. Immunizations are up-to-date or declined.    Subjective:   Chief Complaint  Patient presents with   Hypertension   HPI Bradley Wolfe. 51 y.o. male presents to office today for follow-up to hypertension.   He has a past medical history of CAD, native coronary artery (12/21/12), DM2, GERD, Hyperlipidemia, Hypertension, NSTEMI, Psoriasis, and Tobacco abuse.   He states he has been out of his medications for a few weeks due to not being able to afford.  Just picked up his blood pressure medications 2 days ago.  He is also working night shift which seems to be affecting his blood pressure.  He is currently prescribed lisinopril 40 mg daily, toprol XL 25 mg daily, HCTZ 25 mg and amlodipine 62m daily.  BP Readings from Last 3 Encounters:  03/17/22 (!) 148/82  01/30/22 (!) 193/91  01/05/22 (!) 162/82     DM 2 Well controlled with metformin 1000 mg BID. LDL at goal with high intensity statin.  Lab Results   Component Value Date   HGBA1C 5.5 12/18/2021   Lab Results  Component Value Date   LDLCALC 42 12/18/2021     Review of Systems  Constitutional:  Negative for fever, malaise/fatigue and weight loss.  HENT: Negative.  Negative for nosebleeds.   Eyes: Negative.  Negative for blurred vision, double vision and photophobia.  Respiratory: Negative.  Negative for cough and shortness of breath.   Cardiovascular: Negative.  Negative for chest pain, palpitations and leg swelling.  Gastrointestinal:  Positive for heartburn. Negative for nausea and vomiting.  Musculoskeletal: Negative.  Negative for myalgias.  Neurological: Negative.  Negative for dizziness, focal weakness, seizures and headaches.  Psychiatric/Behavioral: Negative.  Negative for suicidal ideas.    Past Medical History:  Diagnosis Date   CAD (coronary artery disease), native coronary artery 12/21/12   a. NSTEMI 2/14 => LHC 12/21/12: mLAD 90%, OM1 20%, mRCA 100%, EF 55-60%.  PCI: Xience Xpedition DES to mRCA and Xience Xpedition DES to mLAD.    Diabetes mellitus without complication (HCC)    GERD (gastroesophageal reflux disease)    Hyperlipidemia    Hypertension    NSTEMI    Psoriasis    Tobacco abuse     Past Surgical History:  Procedure Laterality Date   CORONARY ANGIOPLASTY WITH STENT PLACEMENT  12/21/12   90% mid LAD s/p DES, 20% OM1, 100% mid RCA  s/p DES; LVEF 55-60%   LEFT HEART CATHETERIZATION WITH CORONARY ANGIOGRAM N/A 12/21/2012   Procedure: LEFT HEART CATHETERIZATION WITH CORONARY ANGIOGRAM;  Surgeon: Burnell Blanks, MD;  Location: [facility] CATH LAB;  Service: Cardiovascular;  Laterality: N/A;    Family History  Problem Relation Age of Onset   Heart attack Father    Heart attack Mother     Social History Reviewed with no changes to be made today.   Outpatient Medications Prior to Visit  Medication Sig Dispense Refill   acetaminophen (TYLENOL) 500 MG tablet Take 2 tablets (1,000 mg total) by mouth every  8 (eight) hours as needed for moderate pain. 30 tablet 0   albuterol (VENTOLIN HFA) 108 (90 Base) MCG/ACT inhaler Inhale 2 puffs into the lungs every 6 (six) hours as needed for wheezing or shortness of breath. 18 g 1   amLODipine (NORVASC) 10 MG tablet Take 1 tablet (10 mg total) by mouth daily. 90 tablet 0   aspirin 81 MG EC tablet Take 1 tablet (81 mg total) by mouth daily. Swallow whole. 30 tablet 12   betamethasone dipropionate 0.05 % cream APPLY TOPICALLY TWO TIMES DAILY. 45 g 1   Blood Glucose Monitoring Suppl (TRUE METRIX METER) w/Device KIT Needs new meter. We tested the his current one with a new battery and it is defective. Patient will pick up scripts today. 1 kit 0   Coal Tar Extract (PSORIASIN EX) Apply 1 application topically as needed (for psoriasis).     gabapentin (NEURONTIN) 600 MG tablet Take 1 tablet (600 mg total) by mouth 3 (three) times daily. 270 tablet 1   glucose blood test strip Use as instructed 100 each 12   hydrochlorothiazide (HYDRODIURIL) 25 MG tablet Take 1 tablet (25 mg total) by mouth daily. 90 tablet 3   lidocaine (XYLOCAINE) 2 % solution Use as directed 15 mLs in the mouth or throat as needed for mouth pain. 300 mL 0   metFORMIN (GLUCOPHAGE) 1000 MG tablet Take 1 tablet (1,000 mg total) by mouth 2 (two) times daily with a meal. 180 tablet 1   metoprolol succinate (TOPROL-XL) 25 MG 24 hr tablet TAKE 1 TABLET (25 MG TOTAL) BY MOUTH DAILY. 30 tablet 0   nitroGLYCERIN (NITROSTAT) 0.4 MG SL tablet Place 1 tablet (0.4 mg total) under the tongue every 5 (five) minutes as needed for chest pain. Report to ER if you have taken  pills or more in 15 minute timeframe 25 tablet 1   TRUEplus Lancets 26G MISC 1 each by Does not apply route every 8 (eight) hours as needed. 100 each 12   atorvastatin (LIPITOR) 80 MG tablet Take 1 tablet (80 mg total) by mouth daily. 30 tablet 0   lisinopril (ZESTRIL) 40 MG tablet TAKE 1 TABLET (40 MG TOTAL) BY MOUTH DAILY. MUST HAVE OFFICE VISIT  FOR REFILLS 30 tablet 0   omeprazole (PRILOSEC) 40 MG capsule Take 1 capsule (40 mg total) by mouth daily. 30 capsule 0   amoxicillin (AMOXIL) 500 MG capsule Take 1 capsule (500 mg total) by mouth 3 (three) times daily. (Patient not taking: Reported on 03/17/2022) 21 capsule 0   amoxicillin-clavulanate (AUGMENTIN) 875-125 MG tablet Take 1 tablet by mouth every 12 (twelve) hours. (Patient not taking: Reported on 03/17/2022) 14 tablet 0   No facility-administered medications prior to visit.    No Known Allergies     Objective:    BP (!) 148/82   Pulse (!) 52   Ht _0  (1.727  m)   Wt 197 lb 9.6 oz (89.6 kg)   SpO2 100%   BMI 30.04 kg/m  Wt Readings from Last 3 Encounters:  03/17/22 197 lb 9.6 oz (89.6 kg)  01/30/22 175 lb (79.4 kg)  12/18/21 199 lb 4 oz (90.4 kg)    Physical Exam Vitals and nursing note reviewed.  Constitutional:      Appearance: He is well-developed.  HENT:     Head: Normocephalic and atraumatic.  Cardiovascular:     Rate and Rhythm: Normal rate and regular rhythm.     Heart sounds: Normal heart sounds. No murmur heard.   No friction rub. No gallop.  Pulmonary:     Effort: Pulmonary effort is normal. No tachypnea or respiratory distress.     Breath sounds: Normal breath sounds. No decreased breath sounds, wheezing, rhonchi or rales.  Chest:     Chest wall: No tenderness.  Abdominal:     General: Bowel sounds are normal.     Palpations: Abdomen is soft.  Musculoskeletal:        General: Normal range of motion.     Cervical back: Normal range of motion.  Skin:    General: Skin is warm and dry.  Neurological:     Mental Status: He is alert and oriented to person, place, and time.     Coordination: Coordination normal.  Psychiatric:        Behavior: Behavior normal. Behavior is cooperative.        Thought Content: Thought content normal.        Judgment: Judgment normal.         Patient has been counseled extensively about nutrition and exercise  as well as the importance of adherence with medications and regular follow-up. The patient was given clear instructions to go to ER or return to medical center if symptoms don't improve, worsen or new problems develop. The patient verbalized understanding.   Follow-up: Return in about 3 months (around 06/17/2022).   Gildardo Pounds, FNP-BC [facility] [place], [place]   03/17/2022, 11:14 AM

## 2022-03-17 NOTE — Chronic Care Management (AMB) (Signed)
Patient seen by Zella Ball, PharmD Candidate on 03/16/22 while they were picking up prescriptions at [facility] at [facility].   Patient has an automated home blood pressure machine. They report home readings 130-140/70-80s  Medication review was performed. They are generally taking medications as prescribed. Does indicate annoyance at increased urination from HCTZ.   The following barriers to adherence were noted: - Forgetting to take medications - reports he has a variable schedule and will forget to take his meds 1-3 times weekly  The following interventions were completed:  - Medications were reviewed - Patient was educated on medications, including indication and administration - Patient was educated on use of adherence strategies, like a pill box or alarms - Patient was educated on how to access home blood pressure machine - Patient was counseled on lifestyle modifications to improve blood pressure  The patient has follow up scheduled:  PCP: 03/17/22   Catie Eppie Gibson, PharmD, [facility] [phone]

## 2022-03-18 LAB — CMP14+EGFR
ALT: 27 IU/L (ref 0–44)
AST: 21 IU/L (ref 0–40)
Albumin/Globulin Ratio: 2 (ref 1.2–2.2)
Albumin: 4.5 g/dL (ref 3.8–4.9)
Alkaline Phosphatase: 108 IU/L (ref 44–121)
BUN/Creatinine Ratio: 14 (ref 9–20)
BUN: 14 mg/dL (ref 6–24)
Bilirubin Total: <0.2 mg/dL (ref 0.0–1.2)
CO2: 26 mmol/L (ref 20–29)
Calcium: 9.3 mg/dL (ref 8.7–10.2)
Chloride: 100 mmol/L (ref 96–106)
Creatinine, Ser: 0.98 mg/dL (ref 0.76–1.27)
Globulin, Total: 2.3 g/dL (ref 1.5–4.5)
Glucose: 84 mg/dL (ref 70–99)
Potassium: 4.2 mmol/L (ref 3.5–5.2)
Sodium: 140 mmol/L (ref 134–144)
Total Protein: 6.8 g/dL (ref 6.0–8.5)
eGFR: 93 mL/min/{1.73_m2} (ref >59)

## 2022-04-28 ENCOUNTER — Encounter (HOSPITAL_COMMUNITY): Payer: Self-pay | Admitting: Emergency Medicine

## 2022-04-28 ENCOUNTER — Ambulatory Visit (HOSPITAL_COMMUNITY)
Admission: EM | Admit: 2022-04-28 | Discharge: 2022-04-28 | Disposition: A | Discharge status: Home / Self Care | Payer: 59 | Attending: Physician Assistant | Admitting: Physician Assistant

## 2022-04-28 ENCOUNTER — Other Ambulatory Visit: Payer: Self-pay

## 2022-04-28 DIAGNOSIS — K047 Periapical abscess without sinus: Secondary | ICD-10-CM | POA: Diagnosis not present

## 2022-04-28 DIAGNOSIS — K0889 Other specified disorders of teeth and supporting structures: Secondary | ICD-10-CM | POA: Diagnosis not present

## 2022-04-28 MED ORDER — IBUPROFEN 800 MG PO TABS
800.0000 mg | ORAL_TABLET | Freq: Three times a day (TID) | ORAL | 0 refills | Status: DC
  Filled 2022-04-28: qty 21, 7d supply, fill #0

## 2022-04-28 MED ORDER — AMOXICILLIN-POT CLAVULANATE 875-125 MG PO TABS
1.0000 | ORAL_TABLET | Freq: Two times a day (BID) | ORAL | 0 refills | Status: DC
  Filled 2022-04-28: qty 14, 7d supply, fill #0

## 2022-04-28 NOTE — Discharge Instructions (Signed)
Take Augmentin twice daily for 7 days.  Gargle with warm salt water.  Alternate Tylenol and ibuprofen for pain.  Do not take additional NSAIDs with ibuprofen including aspirin, ibuprofen/Advil/Motrin, naproxen/Aleve.  It is very importantly follow-up with a dentist.  Please call to schedule an appointment.  If you have any worsening symptoms including swelling of your throat, difficulty swallowing, change in your voice, fever, nausea, vomiting you need to be seen immediately.

## 2022-04-28 NOTE — ED Provider Notes (Signed)
[facility]    CSN: [id number] Arrival date & time: 04/28/22  1655      History   Chief Complaint Chief Complaint  Patient presents with   Dental Pain    HPI Bradley Wolfe. is a 51 y.o. male.   Patient presents today with a several month history of intermittent left lower molar pain.  Reports that he has a broken/decaying tooth in this area but does not have the money to have it removed.  He has been seen in the emergency room several times in the past several months (most recently April 2023) with similar symptoms.  He does report that symptoms improve with antibiotic use only to recur again.  He has been taking ibuprofen without improvement of symptoms.  He has not seen a dentist or had any recent dental procedures.  He reports pain is rated 10 on a 0-10 pain scale, described as aching, no aggravating relieving factors identified.  He denies any muffled voice, dysphagia, fever, nausea, vomiting, odynophagia, swelling of his throat.  Denies antibiotic use since April 2023.    Past Medical History:  Diagnosis Date   CAD (coronary artery disease), native coronary artery 12/21/12   a. NSTEMI 2/14 => LHC 12/21/12: mLAD 90%, OM1 20%, mRCA 100%, EF 55-60%.  PCI: Xience Xpedition DES to mRCA and Xience Xpedition DES to mLAD.    Diabetes mellitus without complication (HCC)    GERD (gastroesophageal reflux disease)    Hyperlipidemia    Hypertension    NSTEMI    Psoriasis    Tobacco abuse     Patient Active Problem List   Diagnosis Date Noted   Type 2 diabetes mellitus ([place]) 07/01/2017   Psoriasis 07/14/2016   CAD (coronary artery disease), native coronary artery 12/22/2012   Hyperlipidemia 12/22/2012   History of non-ST elevation myocardial infarction (NSTEMI) 12/21/2012   Essential hypertension 12/21/2012   GERD (gastroesophageal reflux disease) 12/21/2012    Past Surgical History:  Procedure Laterality Date   CORONARY ANGIOPLASTY WITH STENT PLACEMENT   12/21/12   90% mid LAD s/p DES, 20% OM1, 100% mid RCA s/p DES; LVEF 55-60%   LEFT HEART CATHETERIZATION WITH CORONARY ANGIOGRAM N/A 12/21/2012   Procedure: LEFT HEART CATHETERIZATION WITH CORONARY ANGIOGRAM;  Surgeon: Burnell Blanks, MD;  Location: [facility] CATH LAB;  Service: Cardiovascular;  Laterality: N/A;       Home Medications    Prior to Admission medications   Medication Sig Start Date End Date Taking? Authorizing Provider  amoxicillin-clavulanate (AUGMENTIN) 875-125 MG tablet Take 1 tablet by mouth every 12 (twelve) hours. 04/28/22  Yes Rada Zegers K, PA-C  ibuprofen (ADVIL) 800 MG tablet Take 1 tablet (800 mg total) by mouth 3 (three) times daily. 04/28/22  Yes Kalaya Infantino, Derry Skill, PA-C  acetaminophen (TYLENOL) 500 MG tablet Take 2 tablets (1,000 mg total) by mouth every 8 (eight) hours as needed for moderate pain. 07/01/17   Alfonse Spruce, FNP  albuterol (VENTOLIN HFA) 108 (90 Base) MCG/ACT inhaler Inhale 2 puffs into the lungs every 6 (six) hours as needed for wheezing or shortness of breath. 10/14/21   Gildardo Pounds, NP  amLODipine (NORVASC) 10 MG tablet Take 1 tablet (10 mg total) by mouth daily. 03/10/22 06/08/22  Charlott Rakes, MD  aspirin 81 MG EC tablet Take 1 tablet (81 mg total) by mouth daily. Swallow whole. 12/18/21   Gildardo Pounds, NP  atorvastatin (LIPITOR) 80 MG tablet Take 1 tablet (80 mg total) by mouth  daily. Please fill as a 90 day supply. NEEDS PASS 03/17/22   Gildardo Pounds, NP  betamethasone dipropionate 0.05 % cream APPLY TOPICALLY TWO TIMES DAILY. 12/18/21 12/18/22  Gildardo Pounds, NP  Blood Glucose Monitoring Suppl (TRUE METRIX METER) w/Device KIT Needs new meter. We tested the his current one with a new battery and it is defective. Patient will pick up scripts today. 04/25/19   Gildardo Pounds, NP  Coal Tar Extract (PSORIASIN EX) Apply 1 application topically as needed (for psoriasis).    [provider]  gabapentin (NEURONTIN) 600 MG tablet Take  1 tablet (600 mg total) by mouth 3 (three) times daily. 12/18/21 04/15/22  Gildardo Pounds, NP  glucose blood test strip Use as instructed 08/13/20   Elsie Stain, MD  hydrochlorothiazide (HYDRODIURIL) 25 MG tablet Take 1 tablet (25 mg total) by mouth daily. 12/18/21   Gildardo Pounds, NP  lidocaine (XYLOCAINE) 2 % solution Use as directed 15 mLs in the mouth or throat as needed for mouth pain. 01/30/22   Loni Beckwith, PA-C  lisinopril (ZESTRIL) 40 MG tablet Take 1 tablet (40 mg total) by mouth daily. NEEDS PASS. Please fill as a 90 day supply 03/17/22   Gildardo Pounds, NP  metFORMIN (GLUCOPHAGE) 1000 MG tablet Take 1 tablet (1,000 mg total) by mouth 2 (two) times daily with a meal. 12/18/21 04/15/22  Gildardo Pounds, NP  metoprolol succinate (TOPROL-XL) 25 MG 24 hr tablet TAKE 1 TABLET (25 MG TOTAL) BY MOUTH DAILY. 03/10/22 03/10/23  Charlott Rakes, MD  nitroGLYCERIN (NITROSTAT) 0.4 MG SL tablet Place 1 tablet (0.4 mg total) under the tongue every 5 (five) minutes as needed for chest pain. Report to ER if you have taken  pills or more in 15 minute timeframe 12/18/21   Gildardo Pounds, NP  omeprazole (PRILOSEC) 40 MG capsule Take 1 capsule (40 mg total) by mouth daily. NEEDS PASS.  Please fill as a 90 day supply 03/17/22   Gildardo Pounds, NP  TRUEplus Lancets 26G MISC 1 each by Does not apply route every 8 (eight) hours as needed. 08/13/20   Elsie Stain, MD    Family History Family History  Problem Relation Age of Onset   Heart attack Father    Heart attack Mother     Social History Social History   Tobacco Use   Smoking status: Former    Packs/day: 0.25    Years: 20.00    Total pack years: 5.00    Types: Cigarettes    Quit date: 02/20/2016    Years since quitting: 6.1   Smokeless tobacco: Never   Tobacco comments:    Smoke [place] OCC.   Vaping Use   Vaping Use: Never used  Substance Use Topics   Alcohol use: Yes    Alcohol/week: 1.0 standard drink of alcohol     Types: 1 Standard drinks or equivalent per week    Comment: rarely    Drug use: Yes    Types: Marijuana     Allergies   Patient has no known allergies.   Review of Systems Review of Systems  Constitutional:  Positive for activity change. Negative for appetite change, fatigue and fever.  HENT:  Positive for dental problem. Negative for congestion, sore throat, trouble swallowing and voice change.   Respiratory:  Negative for cough and shortness of breath.   Cardiovascular:  Negative for chest pain.  Gastrointestinal:  Negative for abdominal pain, diarrhea, nausea and  vomiting.     Physical Exam Triage Vital Signs ED Triage Vitals  Enc Vitals Group     BP 04/28/22 1729 (!) 157/66     Pulse Rate 04/28/22 1729 (!) 53     Resp 04/28/22 1729 18     Temp 04/28/22 1729 98 F (36.7 C)     Temp src --      SpO2 04/28/22 1729 98 %     Weight --      Height --      Head Circumference --      Peak Flow --      Pain Score 04/28/22 1728 10     Pain Loc --      Pain Edu? --      Excl. in [place]? --    No data found.  Updated Vital Signs BP (!) 157/66   Pulse (!) 53   Temp 98 F (36.7 C)   Resp 18   SpO2 98%   Visual Acuity Right Eye Distance:   Left Eye Distance:   Bilateral Distance:    Right Eye Near:   Left Eye Near:    Bilateral Near:     Physical Exam Vitals reviewed.  Constitutional:      General: He is awake.     Appearance: Normal appearance. He is well-developed. He is not ill-appearing.     Comments: Very pleasant male appears stated age in no acute distress sitting comfortably in exam room  HENT:     Head: Normocephalic and atraumatic.     Nose: Nose normal.     Mouth/Throat:     Dentition: Abnormal dentition. Gingival swelling, dental caries and dental abscesses present.     Pharynx: Uvula midline. No oropharyngeal exudate or posterior oropharyngeal erythema.      Comments: No evidence of Ludwig angina Cardiovascular:     Rate and Rhythm: Normal  rate and regular rhythm.     Heart sounds: Normal heart sounds, S1 normal and S2 normal. No murmur heard. Pulmonary:     Effort: Pulmonary effort is normal.     Breath sounds: Normal breath sounds. No stridor. No wheezing, rhonchi or rales.     Comments: Clear to auscultation bilaterally Abdominal:     General: Bowel sounds are normal.     Palpations: Abdomen is soft.     Tenderness: There is no abdominal tenderness.  Lymphadenopathy:     Head:     Right side of head: No submental, submandibular or tonsillar adenopathy.     Left side of head: No submental, submandibular or tonsillar adenopathy.  Neurological:     Mental Status: He is alert.  Psychiatric:        Behavior: Behavior is cooperative.      UC Treatments / Results  Labs (all labs ordered are listed, but only abnormal results are displayed) Labs Reviewed - No data to display  EKG   Radiology No results found.  Procedures Procedures (including critical care time)  Medications Ordered in UC Medications - No data to display  Initial Impression / Assessment and Plan / UC Course  I have reviewed the triage vital signs and the nursing notes.  Pertinent labs & imaging results that were available during my care of the patient were reviewed by me and considered in my medical decision making (see chart for details).     Patient started on Augmentin twice daily for 7 days.  Recommended conservative treatment measures including gargling with warm salt water.  He was prescribed ibuprofen 800 for pain relief with instructions to take additional NSAIDs with this medication.  Can use Tylenol for breakthrough pain.  Discussed that he will likely have recurrent symptoms until underlying tooth issue was addressed and recommended that he follow-up with dentist.  He was given contact information for low-cost dentist in the area.  Discussed that if he has any worsening symptoms including dysphagia, odynophagia, fever,  nausea/vomiting, muffled voice, swelling of his throat he needs to go to the emergency room.  Strict return precautions given.  Work excuse note provided.  Final Clinical Impressions(s) / UC Diagnoses   Final diagnoses:  Dental infection  Dentalgia     Discharge Instructions      Take Augmentin twice daily for 7 days.  Gargle with warm salt water.  Alternate Tylenol and ibuprofen for pain.  Do not take additional NSAIDs with ibuprofen including aspirin, ibuprofen/Advil/Motrin, naproxen/Aleve.  It is very importantly follow-up with a dentist.  Please call to schedule an appointment.  If you have any worsening symptoms including swelling of your throat, difficulty swallowing, change in your voice, fever, nausea, vomiting you need to be seen immediately.     ED Prescriptions     Medication Sig Dispense Auth. Provider   amoxicillin-clavulanate (AUGMENTIN) 875-125 MG tablet Take 1 tablet by mouth every 12 (twelve) hours. 14 tablet Alletta Mattos K, PA-C   ibuprofen (ADVIL) 800 MG tablet Take 1 tablet (800 mg total) by mouth 3 (three) times daily. 21 tablet Carmine Carrozza, Derry Skill, PA-C      PDMP not reviewed this encounter.   Terrilee Croak, PA-C 04/28/22 1747

## 2022-04-28 NOTE — ED Triage Notes (Signed)
Pt is present today with left side dental pain. Pt states he has been in pain for months.

## 2022-04-29 ENCOUNTER — Other Ambulatory Visit: Payer: Self-pay

## 2022-06-17 ENCOUNTER — Encounter (HOSPITAL_COMMUNITY): Payer: Self-pay

## 2022-06-17 ENCOUNTER — Ambulatory Visit (HOSPITAL_COMMUNITY)
Admission: EM | Admit: 2022-06-17 | Discharge: 2022-06-17 | Disposition: A | Discharge status: Home / Self Care | Payer: Commercial Managed Care - HMO | Attending: Internal Medicine | Admitting: Internal Medicine

## 2022-06-17 ENCOUNTER — Other Ambulatory Visit: Payer: Self-pay

## 2022-06-17 DIAGNOSIS — K047 Periapical abscess without sinus: Secondary | ICD-10-CM | POA: Diagnosis not present

## 2022-06-17 MED ORDER — HYDROCODONE-ACETAMINOPHEN 5-325 MG PO TABS
1.0000 | ORAL_TABLET | Freq: Three times a day (TID) | ORAL | 0 refills | Status: DC | PRN
Start: 2022-06-17 — End: 2023-07-11
  Filled 2022-06-17: qty 12, 4d supply, fill #0

## 2022-06-17 MED ORDER — IBUPROFEN 600 MG PO TABS
600.0000 mg | ORAL_TABLET | Freq: Four times a day (QID) | ORAL | 0 refills | Status: DC | PRN
  Filled 2022-06-17: qty 30, 8d supply, fill #0

## 2022-06-17 MED ORDER — KETOROLAC TROMETHAMINE 30 MG/ML IJ SOLN
INTRAMUSCULAR | Status: AC
  Filled 2022-06-17: qty 1

## 2022-06-17 MED ORDER — KETOROLAC TROMETHAMINE 30 MG/ML IJ SOLN
30.0000 mg | Freq: Once | INTRAMUSCULAR | Status: AC
  Administered 2022-06-17: 30 mg via INTRAMUSCULAR

## 2022-06-17 MED ORDER — AMOXICILLIN-POT CLAVULANATE 875-125 MG PO TABS
1.0000 | ORAL_TABLET | Freq: Two times a day (BID) | ORAL | 0 refills | Status: DC
  Filled 2022-06-17: qty 14, 7d supply, fill #0

## 2022-06-17 NOTE — ED Triage Notes (Signed)
Pt is here for tooth pain xfew days . Pt denies fever

## 2022-06-17 NOTE — Discharge Instructions (Addendum)
Please take medications as prescribed Attached to your discharge paperwork or resources for affordable dental care Please reach out to any of these dental practices to have dental evaluation. Return to urgent care if you have any other concerns.

## 2022-06-17 NOTE — ED Provider Notes (Signed)
Bradley    Wolfe: [id number] Arrival date & time: 06/17/22  1355      History   Chief Complaint Chief Complaint  Patient presents with   Dental Pain    HPI Bradley Wolfe. is a 51 y.o. male with a history of dental cavity comes to urgent care with 3-day history of left jaw pain.  Pain is throbbing, constant, no known relieving factors.  Over-the-counter medications have not helped.  Pain is currently a 10 out of 10.  No trauma to the jaw.  Patient has not been able to see a dentist for dental care.  No fever or chills.Bradley Wolfe   HPI  Past Medical History:  Diagnosis Date   CAD (coronary artery disease), native coronary artery 12/21/12   a. NSTEMI 2/14 => LHC 12/21/12: mLAD 90%, OM1 20%, mRCA 100%, EF 55-60%.  PCI: Xience Xpedition DES to mRCA and Xience Xpedition DES to mLAD.    Diabetes mellitus without complication (HCC)    GERD (gastroesophageal reflux disease)    Hyperlipidemia    Hypertension    NSTEMI    Psoriasis    Tobacco abuse     Patient Active Problem List   Diagnosis Date Noted   Type 2 diabetes mellitus ([place]) 07/01/2017   Psoriasis 07/14/2016   CAD (coronary artery disease), native coronary artery 12/22/2012   Hyperlipidemia 12/22/2012   History of non-ST elevation myocardial infarction (NSTEMI) 12/21/2012   Essential hypertension 12/21/2012   GERD (gastroesophageal reflux disease) 12/21/2012    Past Surgical History:  Procedure Laterality Date   CORONARY ANGIOPLASTY WITH STENT PLACEMENT  12/21/12   90% mid LAD s/p DES, 20% OM1, 100% mid RCA s/p DES; LVEF 55-60%   LEFT HEART CATHETERIZATION WITH CORONARY ANGIOGRAM N/A 12/21/2012   Procedure: LEFT HEART CATHETERIZATION WITH CORONARY ANGIOGRAM;  Surgeon: Burnell Blanks, MD;  Location: [facility] CATH LAB;  Service: Cardiovascular;  Laterality: N/A;       Home Medications    Prior to Admission medications   Medication Sig Start Date End Date Taking? Authorizing Provider   HYDROcodone-acetaminophen (NORCO/VICODIN) 5-325 MG tablet Take 1 tablet by mouth every 8 (eight) hours as needed. 06/17/22  Yes Angeles Paolucci, Myrene Galas, MD  ibuprofen (ADVIL) 600 MG tablet Take 1 tablet (600 mg total) by mouth every 6 (six) hours as needed. 06/17/22  Yes Chaitanya Amedee, Myrene Galas, MD  albuterol (VENTOLIN HFA) 108 (90 Base) MCG/ACT inhaler Inhale 2 puffs into the lungs every 6 (six) hours as needed for wheezing or shortness of breath. 10/14/21   Gildardo Pounds, NP  amLODipine (NORVASC) 10 MG tablet Take 1 tablet (10 mg total) by mouth daily. 03/10/22 06/08/22  Charlott Rakes, MD  amoxicillin-clavulanate (AUGMENTIN) 875-125 MG tablet Take 1 tablet by mouth every 12 (twelve) hours. 06/17/22   Chase Picket, MD  aspirin 81 MG EC tablet Take 1 tablet (81 mg total) by mouth daily. Swallow whole. 12/18/21   Gildardo Pounds, NP  atorvastatin (LIPITOR) 80 MG tablet Take 1 tablet (80 mg total) by mouth daily. Please fill as a 90 day supply. NEEDS PASS 03/17/22   Gildardo Pounds, NP  betamethasone dipropionate 0.05 % cream APPLY TOPICALLY TWO TIMES DAILY. 12/18/21 12/18/22  Gildardo Pounds, NP  Blood Glucose Monitoring Suppl (TRUE METRIX METER) w/Device KIT Needs new meter. We tested the his current one with a new battery and it is defective. Patient will pick up scripts today. 04/25/19   Gildardo Pounds, NP  [employer]  Tar Extract (PSORIASIN EX) Apply 1 application topically as needed (for psoriasis).    [provider]  gabapentin (NEURONTIN) 600 MG tablet Take 1 tablet (600 mg total) by mouth 3 (three) times daily. 12/18/21 04/15/22  Gildardo Pounds, NP  glucose blood test strip Use as instructed 08/13/20   Elsie Stain, MD  hydrochlorothiazide (HYDRODIURIL) 25 MG tablet Take 1 tablet (25 mg total) by mouth daily. 12/18/21   Gildardo Pounds, NP  lidocaine (XYLOCAINE) 2 % solution Use as directed 15 mLs in the mouth or throat as needed for mouth pain. 01/30/22   Loni Beckwith, PA-C  lisinopril  (ZESTRIL) 40 MG tablet Take 1 tablet (40 mg total) by mouth daily. NEEDS PASS. Please fill as a 90 day supply 03/17/22   Gildardo Pounds, NP  metFORMIN (GLUCOPHAGE) 1000 MG tablet Take 1 tablet (1,000 mg total) by mouth 2 (two) times daily with a meal. 12/18/21 04/15/22  Gildardo Pounds, NP  metoprolol succinate (TOPROL-XL) 25 MG 24 hr tablet TAKE 1 TABLET (25 MG TOTAL) BY MOUTH DAILY. 03/10/22 03/10/23  Charlott Rakes, MD  nitroGLYCERIN (NITROSTAT) 0.4 MG SL tablet Place 1 tablet (0.4 mg total) under the tongue every 5 (five) minutes as needed for chest pain. Report to ER if you have taken  pills or more in 15 minute timeframe 12/18/21   Gildardo Pounds, NP  omeprazole (PRILOSEC) 40 MG capsule Take 1 capsule (40 mg total) by mouth daily. NEEDS PASS.  Please fill as a 90 day supply 03/17/22   Gildardo Pounds, NP  TRUEplus Lancets 26G MISC 1 each by Does not apply route every 8 (eight) hours as needed. 08/13/20   Elsie Stain, MD    Family History Family History  Problem Relation Age of Onset   Heart attack Father    Heart attack Mother     Social History Social History   Tobacco Use   Smoking status: Former    Packs/day: 0.25    Years: 20.00    Total pack years: 5.00    Types: Cigarettes    Quit date: 02/20/2016    Years since quitting: 6.3   Smokeless tobacco: Never   Tobacco comments:    Smoke [place] OCC.   Vaping Use   Vaping Use: Never used  Substance Use Topics   Alcohol use: Yes    Alcohol/week: 1.0 standard drink of alcohol    Types: 1 Standard drinks or equivalent per week    Comment: rarely    Drug use: Yes    Types: Marijuana     Allergies   Patient has no known allergies.   Review of Systems Review of Systems  HENT:  Positive for dental problem.   Respiratory: Negative.    Cardiovascular: Negative.   Gastrointestinal: Negative.      Physical Exam Triage Vital Signs ED Triage Vitals  Enc Vitals Group     BP 06/17/22 1414 (!) 167/92     Pulse  Rate 06/17/22 1414 85     Resp 06/17/22 1414 12     Temp 06/17/22 1414 (!) 97.3 F (36.3 C)     Temp Source 06/17/22 1414 Oral     SpO2 06/17/22 1414 97 %     Weight 06/17/22 1413 179 lb (81.2 kg)     Height 06/17/22 1413 $RemoveBefor'5\' 8"'RMYNspVvZWwz$  (1.727 m)     Head Circumference --      Peak Flow --      Pain Score  06/17/22 1413 10     Pain Loc --      Pain Edu? --      Excl. in [place]? --    No data found.  Updated Vital Signs BP (!) 167/92 (BP Location: Left Arm)   Pulse 85   Temp (!) 97.3 F (36.3 C) (Oral)   Resp 12   Ht $R'5\' 8"'lK$  (1.727 m)   Wt 81.2 kg   SpO2 97%   BMI 27.22 kg/m   Visual Acuity Right Eye Distance:   Left Eye Distance:   Bilateral Distance:    Right Eye Near:   Left Eye Near:    Bilateral Near:     Physical Exam Vitals and nursing note reviewed.  HENT:     Mouth/Throat:     Comments: Dental cavity in the third left mandibular tooth. Cardiovascular:     Rate and Rhythm: Normal rate and regular rhythm.     Pulses: Normal pulses.     Heart sounds: Normal heart sounds.  Pulmonary:     Effort: Pulmonary effort is normal.     Breath sounds: Normal breath sounds.  Neurological:     Mental Status: He is alert.      UC Treatments / Results  Labs (all labs ordered are listed, but only abnormal results are displayed) Labs Reviewed - No data to display  EKG   Radiology No results found.  Procedures Procedures (including critical care time)  Medications Ordered in UC Medications  ketorolac (TORADOL) 30 MG/ML injection 30 mg (30 mg Intramuscular Given 06/17/22 1502)    Initial Impression / Assessment and Plan / UC Course  I have reviewed the triage vital signs and the nursing notes.  Pertinent labs & imaging results that were available during my care of the patient were reviewed by me and considered in my medical decision making (see chart for details).     1.  Dental infection: Toradol 30 mg IM x1 dose Augmentin 875-125 mg twice daily for 7  days Ibuprofen 600 mg every 6 hours as needed for pain Hydrocodone 1 tablet every 6-8 hours as needed for pain Patient is encouraged to follow-up with a dentist for dental extraction.  Dental resources was given to the patient. Final Clinical Impressions(s) / UC Diagnoses   Final diagnoses:  Dental infection     Discharge Instructions      Please take medications as prescribed Attached to your discharge paperwork or resources for affordable dental care Please reach out to any of these dental practices to have dental evaluation. Return to urgent care if you have any other concerns.   ED Prescriptions     Medication Sig Dispense Auth. Provider   amoxicillin-clavulanate (AUGMENTIN) 875-125 MG tablet Take 1 tablet by mouth every 12 (twelve) hours. 14 tablet Aerie Donica, Myrene Galas, MD   HYDROcodone-acetaminophen (NORCO/VICODIN) 5-325 MG tablet Take 1 tablet by mouth every 8 (eight) hours as needed. 12 tablet Janthony Holleman, Myrene Galas, MD   ibuprofen (ADVIL) 600 MG tablet Take 1 tablet (600 mg total) by mouth every 6 (six) hours as needed. 30 tablet Naylin Burkle, Myrene Galas, MD      I have reviewed the PDMP during this encounter.   Chase Picket, MD 06/17/22 [phone]

## 2022-06-23 ENCOUNTER — Ambulatory Visit: Payer: 59 | Admitting: Nurse Practitioner

## 2022-07-27 ENCOUNTER — Other Ambulatory Visit: Payer: Self-pay

## 2022-07-27 ENCOUNTER — Encounter: Payer: Self-pay | Admitting: Nurse Practitioner

## 2022-07-27 ENCOUNTER — Ambulatory Visit: Payer: Commercial Managed Care - HMO | Attending: Nurse Practitioner | Admitting: Nurse Practitioner

## 2022-07-27 VITALS — BP 147/74 | HR 60 | Temp 97.8°F | Ht 68.0 in | Wt 201.6 lb

## 2022-07-27 DIAGNOSIS — E1165 Type 2 diabetes mellitus with hyperglycemia: Secondary | ICD-10-CM

## 2022-07-27 DIAGNOSIS — L409 Psoriasis, unspecified: Secondary | ICD-10-CM

## 2022-07-27 DIAGNOSIS — E785 Hyperlipidemia, unspecified: Secondary | ICD-10-CM

## 2022-07-27 DIAGNOSIS — I252 Old myocardial infarction: Secondary | ICD-10-CM

## 2022-07-27 DIAGNOSIS — E1142 Type 2 diabetes mellitus with diabetic polyneuropathy: Secondary | ICD-10-CM | POA: Diagnosis not present

## 2022-07-27 DIAGNOSIS — K219 Gastro-esophageal reflux disease without esophagitis: Secondary | ICD-10-CM

## 2022-07-27 DIAGNOSIS — I10 Essential (primary) hypertension: Secondary | ICD-10-CM | POA: Diagnosis not present

## 2022-07-27 DIAGNOSIS — M79605 Pain in left leg: Secondary | ICD-10-CM

## 2022-07-27 LAB — GLUCOSE, POCT (MANUAL RESULT ENTRY): POC Glucose: 132 mg/dl — AB (ref 70–99)

## 2022-07-27 LAB — POCT GLYCOSYLATED HEMOGLOBIN (HGB A1C): Hemoglobin A1C: 7.2 % — AB (ref 4.0–5.6)

## 2022-07-27 MED ORDER — GABAPENTIN 600 MG PO TABS
600.0000 mg | ORAL_TABLET | Freq: Three times a day (TID) | ORAL | 1 refills | Status: DC
  Filled 2022-07-27: qty 90, 30d supply, fill #0
  Filled 2022-10-02: qty 90, 30d supply, fill #1
  Filled 2022-11-24 – 2022-12-08 (×3): qty 90, 30d supply, fill #2
  Filled 2023-03-07: qty 90, 30d supply, fill #3

## 2022-07-27 MED ORDER — ATORVASTATIN CALCIUM 80 MG PO TABS
80.0000 mg | ORAL_TABLET | Freq: Every day | ORAL | 1 refills | Status: DC
  Filled 2022-07-27: qty 30, 30d supply, fill #0
  Filled 2022-11-24: qty 30, 30d supply, fill #1
  Filled 2023-03-07: qty 30, 30d supply, fill #2

## 2022-07-27 MED ORDER — BETAMETHASONE DIPROPIONATE 0.05 % EX CREA
TOPICAL_CREAM | CUTANEOUS | 2 refills | Status: DC
  Filled 2022-07-27: qty 15, 28d supply, fill #0
  Filled 2022-10-04: qty 15, 28d supply, fill #1
  Filled 2022-11-24 – 2023-04-04 (×3): qty 15, 28d supply, fill #2
  Filled 2023-04-14: qty 45, 30d supply, fill #2

## 2022-07-27 MED ORDER — OMEPRAZOLE 40 MG PO CPDR
40.0000 mg | DELAYED_RELEASE_CAPSULE | Freq: Every day | ORAL | 1 refills | Status: DC
  Filled 2022-07-27: qty 30, 30d supply, fill #0
  Filled 2022-10-02: qty 30, 30d supply, fill #1
  Filled 2022-11-24: qty 30, 30d supply, fill #2
  Filled 2023-03-07: qty 30, 30d supply, fill #3

## 2022-07-27 MED ORDER — DICLOFENAC SODIUM 1 % EX GEL
2.0000 g | Freq: Four times a day (QID) | CUTANEOUS | 3 refills | Status: DC
  Filled 2022-07-27: qty 100, 30d supply, fill #0

## 2022-07-27 MED ORDER — ASPIRIN 81 MG PO TBEC: 81.0000 mg | DELAYED_RELEASE_TABLET | Freq: Every day | ORAL | 12 refills | Status: AC

## 2022-07-27 MED ORDER — AMLODIPINE BESYLATE 10 MG PO TABS: 10.0000 mg | ORAL_TABLET | Freq: Every day | ORAL | 1 refills | Status: DC

## 2022-07-27 MED ORDER — LISINOPRIL 40 MG PO TABS
40.0000 mg | ORAL_TABLET | Freq: Every day | ORAL | 1 refills | Status: DC
  Filled 2022-07-27: qty 30, 30d supply, fill #0
  Filled 2022-10-02: qty 30, 30d supply, fill #1
  Filled 2022-11-24 – 2023-03-07 (×3): qty 30, 30d supply, fill #2

## 2022-07-27 MED ORDER — METFORMIN HCL 1000 MG PO TABS
1000.0000 mg | ORAL_TABLET | Freq: Two times a day (BID) | ORAL | 1 refills | Status: DC
  Filled 2022-07-27: qty 60, 30d supply, fill #0
  Filled 2023-03-07 (×2): qty 60, 30d supply, fill #1

## 2022-07-27 MED ORDER — METOPROLOL SUCCINATE ER 25 MG PO TB24
ORAL_TABLET | Freq: Every day | ORAL | 1 refills | Status: DC
  Filled 2022-07-27: qty 30, 30d supply, fill #0
  Filled 2022-10-02: qty 30, 30d supply, fill #1
  Filled 2022-11-24 – 2023-03-07 (×3): qty 30, 30d supply, fill #2

## 2022-07-27 MED ORDER — HYDROCHLOROTHIAZIDE 25 MG PO TABS
25.0000 mg | ORAL_TABLET | Freq: Every day | ORAL | 3 refills | Status: DC
  Filled 2022-07-27: qty 30, 30d supply, fill #0
  Filled 2022-10-02: qty 30, 30d supply, fill #1
  Filled 2022-11-24: qty 30, 30d supply, fill #2
  Filled 2023-03-07: qty 30, 30d supply, fill #3

## 2022-07-27 NOTE — Progress Notes (Signed)
Assessment & Plan:  Bradley Wolfe was seen today for hypertension and diabetes.  Diagnoses and all orders for this visit:  Essential hypertension -     amLODipine (NORVASC) 10 MG tablet; Take 1 tablet (10 mg total) by mouth daily. FOR BLOOD PRESSURE. Please fill as a 90 day supply -     hydrochlorothiazide (HYDRODIURIL) 25 MG tablet; Take 1 tablet (25 mg total) by mouth daily FOR BLOOD PRESSURE -     lisinopril (ZESTRIL) 40 MG tablet; Take 1 tablet (40 mg total) by mouth daily FOR BLOOD PRESSURE -     metoprolol succinate (TOPROL-XL) 25 MG 24 hr tablet; TAKE 1 TABLET (25 MG TOTAL) BY MOUTH DAILY FOR BLOOD PRESSURE -     CMP14+EGFR  Type 2 diabetes mellitus with hyperglycemia, without long-term current use of insulin (HCC) -     POCT glycosylated hemoglobin (Hb A1C) -     POCT glucose (manual entry) -     lisinopril (ZESTRIL) 40 MG tablet; Take 1 tablet (40 mg total) by mouth daily FOR BLOOD PRESSURE -     metFORMIN (GLUCOPHAGE) 1000 MG tablet; Take 1 tablet (1,000 mg total) by mouth 2 (two) times daily with a meaL FOR Diabetes -     CMP14+EGFR  Hyperlipidemia LDL goal <70 -     atorvastatin (LIPITOR) 80 MG tablet; Take 1 tablet (80 mg total) by mouth daily FOR CHOLESTEROL  Diabetic polyneuropathy associated with type 2 diabetes mellitus (HCC) -     gabapentin (NEURONTIN) 600 MG tablet; Take 1 tablet (600 mg total) by mouth 3 (three) times daily FOR LEG PAIN  GERD without esophagitis Well controlled.  -     omeprazole (PRILOSEC) 40 MG capsule; Take 1 capsule (40 mg total) by mouth daily FOR ACID REFLUX INSTRUCTIONS: Avoid GERD Triggers: acidic, spicy or fried foods, caffeine, coffee, sodas,  alcohol and chocolate.     Left leg pain Wear knee sleeve at work. -     diclofenac Sodium (VOLTAREN) 1 % GEL; Apply 2 g topically 4 (four) times daily FOR LEFT LEG PAIN  Psoriasis Needs to see derm. Waiting on insurance open enrollment -     betamethasone dipropionate 0.05 % cream; APPLY  TOPICALLY TWO TIMES DAILY.  History of MI (myocardial infarction) -     aspirin EC 81 MG tablet; Take 1 tablet (81 mg total) by mouth daily. Swallow whole.    Patient has been counseled on age-appropriate routine health concerns for screening and prevention. These are reviewed and up-to-date. Referrals have been placed accordingly. Immunizations are up-to-date or declined.    Subjective:   Chief Complaint  Patient presents with   Hypertension   Diabetes   HPI [name]. 51 y.o. male presents to office today to follow up to HTN and DM  He has a past medical history of CAD, native coronary artery (12/21/12), DM2, GERD, Hyperlipidemia, Hypertension, NSTEMI, Psoriasis, and Tobacco abuse   HTN Poorly controlled. He is not taking his medications daily as prescribed. States he sometimes forgets to take them based on his work schedule. He is also working night shift which may be affecting his blood pressure.  He is currently prescribed lisinopril 40 mg daily, toprol XL 25 mg daily, HCTZ 25 mg and amlodipine $RemoveBefore'10mg'UoLMZDbzREvpv$  daily.  BP Readings from Last 3 Encounters:  07/27/22 (!) 147/74  06/17/22 (!) 167/92  04/28/22 (!) 157/66    DM 2 Not well controlled with metformin 1000 mg BID. He admits to missing doses  of his diabetes medication as well. LDL at goal with high intensity statin.  Lab Results  Component Value Date   HGBA1C 7.2 (A) 07/27/2022    Lab Results  Component Value Date   HGBA1C 5.5 12/18/2021    Lab Results  Component Value Date   LDLCALC 42 12/18/2021    Knee Pain Feels posterior knee pain. Aggravating factors: Flexing and extending the left leg, bending the left knee backwards or applying pressure to the back of the knee.  Unrelated to any injury or trauma.     Review of Systems  Constitutional:  Negative for fever, malaise/fatigue and weight loss.  HENT: Negative.  Negative for nosebleeds.   Eyes: Negative.  Negative for blurred vision, double vision and  photophobia.  Respiratory: Negative.  Negative for cough and shortness of breath.   Cardiovascular: Negative.  Negative for chest pain, palpitations and leg swelling.  Gastrointestinal: Negative.  Negative for heartburn, nausea and vomiting.  Musculoskeletal:  Positive for joint pain. Negative for myalgias.  Neurological: Negative.  Negative for dizziness, focal weakness, seizures and headaches.  Psychiatric/Behavioral: Negative.  Negative for suicidal ideas.     Past Medical History:  Diagnosis Date   CAD (coronary artery disease), native coronary artery 12/21/12   a. NSTEMI 2/14 => LHC 12/21/12: mLAD 90%, OM1 20%, mRCA 100%, EF 55-60%.  PCI: Xience Xpedition DES to mRCA and Xience Xpedition DES to mLAD.    Diabetes mellitus without complication (HCC)    GERD (gastroesophageal reflux disease)    Hyperlipidemia    Hypertension    NSTEMI    Psoriasis    Tobacco abuse     Past Surgical History:  Procedure Laterality Date   CORONARY ANGIOPLASTY WITH STENT PLACEMENT  12/21/12   90% mid LAD s/p DES, 20% OM1, 100% mid RCA s/p DES; LVEF 55-60%   LEFT HEART CATHETERIZATION WITH CORONARY ANGIOGRAM N/A 12/21/2012   Procedure: LEFT HEART CATHETERIZATION WITH CORONARY ANGIOGRAM;  Surgeon: Burnell Blanks, MD;  Location: [facility] CATH LAB;  Service: Cardiovascular;  Laterality: N/A;    Family History  Problem Relation Age of Onset   Heart attack Father    Heart attack Mother     Social History Reviewed with no changes to be made today.   Outpatient Medications Prior to Visit  Medication Sig Dispense Refill   albuterol (VENTOLIN HFA) 108 (90 Base) MCG/ACT inhaler Inhale 2 puffs into the lungs every 6 (six) hours as needed for wheezing or shortness of breath. 18 g 1   [place] Extract (PSORIASIN EX) Apply 1 application topically as needed (for psoriasis).     HYDROcodone-acetaminophen (NORCO/VICODIN) 5-325 MG tablet Take 1 tablet by mouth every 8 (eight) hours as needed. 12 tablet 0    ibuprofen (ADVIL) 600 MG tablet Take 1 tablet (600 mg total) by mouth every 6 (six) hours as needed. 30 tablet 0   lidocaine (XYLOCAINE) 2 % solution Use as directed 15 mLs in the mouth or throat as needed for mouth pain. 300 mL 0   nitroGLYCERIN (NITROSTAT) 0.4 MG SL tablet Place 1 tablet (0.4 mg total) under the tongue every 5 (five) minutes as needed for chest pain. Report to ER if you have taken  pills or more in 15 minute timeframe 25 tablet 1   TRUEplus Lancets 26G MISC 1 each by Does not apply route every 8 (eight) hours as needed. 100 each 12   amLODipine (NORVASC) 10 MG tablet Take 1 tablet (10 mg total) by mouth daily.  90 tablet 0   aspirin 81 MG EC tablet Take 1 tablet (81 mg total) by mouth daily. Swallow whole. 30 tablet 12   atorvastatin (LIPITOR) 80 MG tablet Take 1 tablet (80 mg total) by mouth daily. Please fill as a 90 day supply. NEEDS PASS 90 tablet 1   betamethasone dipropionate 0.05 % cream APPLY TOPICALLY TWO TIMES DAILY. 45 g 1   gabapentin (NEURONTIN) 600 MG tablet Take 1 tablet (600 mg total) by mouth 3 (three) times daily. 270 tablet 1   hydrochlorothiazide (HYDRODIURIL) 25 MG tablet Take 1 tablet (25 mg total) by mouth daily. 90 tablet 3   lisinopril (ZESTRIL) 40 MG tablet Take 1 tablet (40 mg total) by mouth daily. NEEDS PASS. Please fill as a 90 day supply 90 tablet 1   metFORMIN (GLUCOPHAGE) 1000 MG tablet Take 1 tablet (1,000 mg total) by mouth 2 (two) times daily with a meal. 180 tablet 1   metoprolol succinate (TOPROL-XL) 25 MG 24 hr tablet TAKE 1 TABLET (25 MG TOTAL) BY MOUTH DAILY. 30 tablet 0   omeprazole (PRILOSEC) 40 MG capsule Take 1 capsule (40 mg total) by mouth daily. NEEDS PASS.  Please fill as a 90 day supply 90 capsule 1   Blood Glucose Monitoring Suppl (TRUE METRIX METER) w/Device KIT Needs new meter. We tested the his current one with a new battery and it is defective. Patient will pick up scripts today. (Patient not taking: Reported on 07/27/2022) 1 kit 0    glucose blood test strip Use as instructed (Patient not taking: Reported on 07/27/2022) 100 each 12   amoxicillin-clavulanate (AUGMENTIN) 875-125 MG tablet Take 1 tablet by mouth every 12 (twelve) hours. (Patient not taking: Reported on 07/27/2022) 14 tablet 0   No facility-administered medications prior to visit.    No Known Allergies     Objective:    BP (!) 147/74   Pulse 60   Temp 97.8 F (36.6 C) (Oral)   Ht $R'5\' 8"'uW$  (1.727 m)   Wt 201 lb 9.6 oz (91.4 kg)   SpO2 100%   BMI 30.65 kg/m  Wt Readings from Last 3 Encounters:  07/27/22 201 lb 9.6 oz (91.4 kg)  06/17/22 179 lb (81.2 kg)  03/17/22 197 lb 9.6 oz (89.6 kg)    Physical Exam Vitals and nursing note reviewed.  Constitutional:      Appearance: He is well-developed.  HENT:     Head: Normocephalic and atraumatic.  Cardiovascular:     Rate and Rhythm: Normal rate and regular rhythm.     Heart sounds: Normal heart sounds. No murmur heard.    No friction rub. No gallop.  Pulmonary:     Effort: Pulmonary effort is normal. No tachypnea or respiratory distress.     Breath sounds: Normal breath sounds. No decreased breath sounds, wheezing, rhonchi or rales.  Chest:     Chest wall: No tenderness.  Abdominal:     General: Bowel sounds are normal.     Palpations: Abdomen is soft.  Musculoskeletal:        General: Normal range of motion.     Cervical back: Normal range of motion.  Skin:    General: Skin is warm and dry.  Neurological:     Mental Status: He is alert and oriented to person, place, and time.     Coordination: Coordination normal.  Psychiatric:        Behavior: Behavior normal. Behavior is cooperative.        Thought  Content: Thought content normal.        Judgment: Judgment normal.          Patient has been counseled extensively about nutrition and exercise as well as the importance of adherence with medications and regular follow-up. The patient was given clear instructions to go to ER or return  to medical center if symptoms don't improve, worsen or new problems develop. The patient verbalized understanding.   Follow-up: Return in about 3 months (around 10/27/2022) for HTN.   Gildardo Pounds, FNP-BC [facility] and [facility] [place], [place]   07/27/2022, 10:23 AM

## 2022-07-28 ENCOUNTER — Other Ambulatory Visit: Payer: Self-pay

## 2022-07-28 LAB — CMP14+EGFR
ALT: 25 IU/L (ref 0–44)
AST: 24 IU/L (ref 0–40)
Albumin/Globulin Ratio: 1.8 (ref 1.2–2.2)
Albumin: 4.6 g/dL (ref 3.8–4.9)
Alkaline Phosphatase: 111 IU/L (ref 44–121)
BUN/Creatinine Ratio: 17 (ref 9–20)
BUN: 18 mg/dL (ref 6–24)
Bilirubin Total: <0.2 mg/dL (ref 0.0–1.2)
CO2: 22 mmol/L (ref 20–29)
Calcium: 9.7 mg/dL (ref 8.7–10.2)
Chloride: 103 mmol/L (ref 96–106)
Creatinine, Ser: 1.05 mg/dL (ref 0.76–1.27)
Globulin, Total: 2.6 g/dL (ref 1.5–4.5)
Glucose: 84 mg/dL (ref 70–99)
Potassium: 4.8 mmol/L (ref 3.5–5.2)
Sodium: 142 mmol/L (ref 134–144)
Total Protein: 7.2 g/dL (ref 6.0–8.5)
eGFR: 85 mL/min/{1.73_m2} (ref >59)

## 2022-08-05 ENCOUNTER — Other Ambulatory Visit: Payer: Self-pay

## 2022-08-05 ENCOUNTER — Other Ambulatory Visit: Payer: Self-pay | Admitting: Pharmacist

## 2022-08-05 DIAGNOSIS — I10 Essential (primary) hypertension: Secondary | ICD-10-CM

## 2022-08-05 MED ORDER — AMLODIPINE BESYLATE 10 MG PO TABS
10.0000 mg | ORAL_TABLET | Freq: Every day | ORAL | 1 refills | Status: DC
  Filled 2022-08-05: qty 30, 30d supply, fill #0
  Filled 2022-10-02: qty 30, 30d supply, fill #1
  Filled 2022-11-24 – 2023-03-07 (×3): qty 30, 30d supply, fill #2

## 2022-08-06 ENCOUNTER — Other Ambulatory Visit: Payer: Self-pay

## 2022-10-04 ENCOUNTER — Other Ambulatory Visit: Payer: Self-pay

## 2022-10-06 ENCOUNTER — Other Ambulatory Visit: Payer: Self-pay

## 2022-10-09 DIAGNOSIS — R638 Other symptoms and signs concerning food and fluid intake: Secondary | ICD-10-CM | POA: Diagnosis not present

## 2022-10-09 DIAGNOSIS — Z7982 Long term (current) use of aspirin: Secondary | ICD-10-CM | POA: Diagnosis not present

## 2022-10-09 DIAGNOSIS — Z7984 Long term (current) use of oral hypoglycemic drugs: Secondary | ICD-10-CM | POA: Diagnosis not present

## 2022-10-09 DIAGNOSIS — R112 Nausea with vomiting, unspecified: Secondary | ICD-10-CM | POA: Insufficient documentation

## 2022-10-09 DIAGNOSIS — R197 Diarrhea, unspecified: Secondary | ICD-10-CM | POA: Diagnosis not present

## 2022-10-09 DIAGNOSIS — R0609 Other forms of dyspnea: Secondary | ICD-10-CM | POA: Diagnosis not present

## 2022-10-09 DIAGNOSIS — Z1152 Encounter for screening for COVID-19: Secondary | ICD-10-CM | POA: Insufficient documentation

## 2022-10-09 DIAGNOSIS — E119 Type 2 diabetes mellitus without complications: Secondary | ICD-10-CM | POA: Insufficient documentation

## 2022-10-09 DIAGNOSIS — R519 Headache, unspecified: Secondary | ICD-10-CM | POA: Diagnosis not present

## 2022-10-10 ENCOUNTER — Emergency Department (HOSPITAL_COMMUNITY)
Admission: EM | Admit: 2022-10-10 | Discharge: 2022-10-10 | Disposition: A | Discharge status: Home / Self Care | Payer: Commercial Managed Care - HMO | Attending: Emergency Medicine | Admitting: Emergency Medicine

## 2022-10-10 ENCOUNTER — Other Ambulatory Visit: Payer: Self-pay

## 2022-10-10 ENCOUNTER — Encounter (HOSPITAL_COMMUNITY): Payer: Self-pay

## 2022-10-10 DIAGNOSIS — R110 Nausea: Secondary | ICD-10-CM

## 2022-10-10 LAB — CBC WITH DIFFERENTIAL/PLATELET
Abs Immature Granulocytes: 0.01 10*3/uL (ref 0.00–0.07)
Basophils Absolute: 0 10*3/uL (ref 0.0–0.1)
Basophils Relative: 1 %
Eosinophils Absolute: 0 10*3/uL (ref 0.0–0.5)
Eosinophils Relative: 1 %
HCT: 45.9 % (ref 39.0–52.0)
Hemoglobin: 15.3 g/dL (ref 13.0–17.0)
Immature Granulocytes: 0 %
Lymphocytes Relative: 34 %
Lymphs Abs: 1.4 10*3/uL (ref 0.7–4.0)
MCH: 29.6 pg (ref 26.0–34.0)
MCHC: 33.3 g/dL (ref 30.0–36.0)
MCV: 88.8 fL (ref 80.0–100.0)
Monocytes Absolute: 0.6 10*3/uL (ref 0.1–1.0)
Monocytes Relative: 15 %
Neutro Abs: 2 10*3/uL (ref 1.7–7.7)
Neutrophils Relative %: 49 %
Platelets: 227 10*3/uL (ref 150–400)
RBC: 5.17 MIL/uL (ref 4.22–5.81)
RDW: 12.3 % (ref 11.5–15.5)
WBC: 4 10*3/uL (ref 4.0–10.5)
nRBC: 0 % (ref 0.0–0.2)

## 2022-10-10 LAB — COMPREHENSIVE METABOLIC PANEL
ALT: 35 U/L (ref 0–44)
AST: 31 U/L (ref 15–41)
Albumin: 4 g/dL (ref 3.5–5.0)
Alkaline Phosphatase: 85 U/L (ref 38–126)
Anion gap: 10 (ref 5–15)
BUN: 27 mg/dL — ABNORMAL HIGH (ref 6–20)
CO2: 23 mmol/L (ref 22–32)
Calcium: 8.7 mg/dL — ABNORMAL LOW (ref 8.9–10.3)
Chloride: 104 mmol/L (ref 98–111)
Creatinine, Ser: 0.9 mg/dL (ref 0.61–1.24)
GFR, Estimated: >60 mL/min (ref >60)
Glucose, Bld: 106 mg/dL — ABNORMAL HIGH (ref 70–99)
Potassium: 3.7 mmol/L (ref 3.5–5.1)
Sodium: 137 mmol/L (ref 135–145)
Total Bilirubin: 0.4 mg/dL (ref 0.3–1.2)
Total Protein: 7.9 g/dL (ref 6.5–8.1)

## 2022-10-10 LAB — RESP PANEL BY RT-PCR (RSV, FLU A&B, COVID)  RVPGX2
Influenza A by PCR: NEGATIVE
Influenza B by PCR: NEGATIVE
Resp Syncytial Virus by PCR: NEGATIVE
SARS Coronavirus 2 by RT PCR: NEGATIVE

## 2022-10-10 LAB — LIPASE, BLOOD: Lipase: 32 U/L (ref 11–51)

## 2022-10-10 LAB — TROPONIN I (HIGH SENSITIVITY)
Troponin I (High Sensitivity): 4 ng/L (ref <18)
Troponin I (High Sensitivity): 3 ng/L (ref <18)

## 2022-10-10 MED ORDER — ONDANSETRON 4 MG PO TBDP
4.0000 mg | ORAL_TABLET | Freq: Three times a day (TID) | ORAL | 0 refills | Status: DC | PRN
  Filled 2022-10-10: qty 20, 7d supply, fill #0

## 2022-10-10 MED ORDER — LACTATED RINGERS IV BOLUS
1000.0000 mL | Freq: Once | INTRAVENOUS | Status: AC
  Administered 2022-10-10: 1000 mL via INTRAVENOUS

## 2022-10-10 MED ORDER — ONDANSETRON HCL 4 MG/2ML IJ SOLN
4.0000 mg | Freq: Once | INTRAMUSCULAR | Status: AC
  Administered 2022-10-10: 4 mg via INTRAVENOUS
  Filled 2022-10-10: qty 2

## 2022-10-10 NOTE — ED Provider Notes (Signed)
  Physical Exam  BP 116/72 (BP Location: Left Arm)   Pulse 77   Temp 98.5 F (36.9 C) (Oral)   Resp 16   Ht 5\' 8"  (1.727 m)   Wt 91 kg   SpO2 100%   BMI 30.50 kg/m   Physical Exam Vitals and nursing note reviewed.  Constitutional:      General: He is not in acute distress.    Appearance: Normal appearance. He is normal weight. He is not ill-appearing, toxic-appearing or diaphoretic.  HENT:     Head: Normocephalic and atraumatic.  Cardiovascular:     Rate and Rhythm: Normal rate and regular rhythm.  Pulmonary:     Effort: Pulmonary effort is normal. No respiratory distress.  Abdominal:     General: Abdomen is flat.     Tenderness: There is no abdominal tenderness. There is no guarding.  Musculoskeletal:        General: Normal range of motion.     Cervical back: Neck supple.  Skin:    General: Skin is warm and dry.  Neurological:     Mental Status: He is alert and oriented to person, place, and time.  Psychiatric:        Mood and Affect: Mood normal.        Behavior: Behavior normal.     Procedures  Procedures  ED Course / MDM    Medical Decision Making Amount and/or Complexity of Data Reviewed Labs: ordered.  Risk Prescription drug management.  Assumed care at shift change from [place], PA-C.  Please see her note for full HPI.  In short, patient is a 51 year old male with previous MI who presents to the ED for evaluation of nausea without emesis.  States it feels similar to when he had his MI in the past.  Specifically denies chest pain or shortness of breath.  Current plan is to trend second troponin and to discharge with Zofran if negative.  0945-second troponin negative at 3.  Patient still without chest pain or shortness of breath.  Is asymptomatic at rest.  Would like to go home.  Will send prescription for Zofran.  Vitals have been stable throughout his stay.  Appropriate for discharge.  At this time there does not appear to be any evidence of an acute  emergency medical condition and the patient appears stable for discharge with appropriate outpatient follow up. Diagnosis was discussed with patient who verbalizes understanding of care plan and is agreeable to discharge. I have discussed return precautions with patient who verbalizes understanding. Patient encouraged to follow-up with their PCP within 1 week. All questions answered.  Note: Portions of this report may have been transcribed using voice recognition software. Every effort was made to ensure accuracy; however, inadvertent computerized transcription errors may still be present.         44, PA-C 10/10/22 1011    10/12/22, MD 10/11/22 1408

## 2022-10-10 NOTE — ED Provider Notes (Signed)
[facility] DEPT Provider Note   CSN: [id number] Arrival date & time: 10/09/22  2307     History  Chief Complaint  Patient presents with   Nausea    Bradley Wolfe. is a 51 y.o. male who presents with concern for N/V x 48 hours, DOE, weakness, appetite change.  No known ill contacts, did have a mild headache to his arrival to ED this evening.  Difficulty tolerating p.o. at this time.  I personally reviewed his medical records.  History of NSTEMI, GERD, psoriasis and type 2 diabetes.  Did not use any nitroglycerin this evening.  No chest pain or shortness of breath.  He is not anticoagulated. HPI     Home Medications Prior to Admission medications   Medication Sig Start Date End Date Taking? Authorizing Provider  albuterol (VENTOLIN HFA) 108 (90 Base) MCG/ACT inhaler Inhale 2 puffs into the lungs every 6 (six) hours as needed for wheezing or shortness of breath. 10/14/21   Gildardo Pounds, NP  amLODipine (NORVASC) 10 MG tablet Take 1 tablet (10 mg total) by mouth daily FOR BLOOD PRESSURE. 08/05/22   Charlott Rakes, MD  aspirin EC 81 MG tablet Take 1 tablet (81 mg total) by mouth daily. Swallow whole. 07/27/22   Gildardo Pounds, NP  atorvastatin (LIPITOR) 80 MG tablet Take 1 tablet (80 mg total) by mouth daily FOR CHOLESTEROL 07/27/22   Gildardo Pounds, NP  betamethasone dipropionate 0.05 % cream APPLY TOPICALLY TWO TIMES DAILY. 07/27/22   Gildardo Pounds, NP  Blood Glucose Monitoring Suppl (TRUE METRIX METER) w/Device KIT Needs new meter. We tested the his current one with a new battery and it is defective. Patient will pick up scripts today. Patient not taking: Reported on 07/27/2022 04/25/19   Gildardo Pounds, NP  Coal Tar Extract (PSORIASIN EX) Apply 1 application topically as needed (for psoriasis).    [provider]  diclofenac Sodium (VOLTAREN) 1 % GEL Apply 2 g topically 4 (four) times daily FOR LEFT LEG PAIN 07/27/22   Gildardo Pounds, NP  gabapentin (NEURONTIN) 600 MG tablet Take 1 tablet (600 mg total) by mouth 3 (three) times daily FOR LEG PAIN 07/27/22   Gildardo Pounds, NP  glucose blood test strip Use as instructed Patient not taking: Reported on 07/27/2022 08/13/20   Elsie Stain, MD  hydrochlorothiazide (HYDRODIURIL) 25 MG tablet Take 1 tablet (25 mg total) by mouth daily FOR BLOOD PRESSURE 07/27/22   Gildardo Pounds, NP  HYDROcodone-acetaminophen (NORCO/VICODIN) 5-325 MG tablet Take 1 tablet by mouth every 8 (eight) hours as needed. 06/17/22   Chase Picket, MD  ibuprofen (ADVIL) 600 MG tablet Take 1 tablet (600 mg total) by mouth every 6 (six) hours as needed. 06/17/22   Lamptey, Myrene Galas, MD  lidocaine (XYLOCAINE) 2 % solution Use as directed 15 mLs in the mouth or throat as needed for mouth pain. 01/30/22   Loni Beckwith, PA-C  lisinopril (ZESTRIL) 40 MG tablet Take 1 tablet (40 mg total) by mouth daily FOR BLOOD PRESSURE 07/27/22   Gildardo Pounds, NP  metFORMIN (GLUCOPHAGE) 1000 MG tablet Take 1 tablet (1,000 mg total) by mouth 2 (two) times daily with a meaL FOR Diabetes 07/27/22   Gildardo Pounds, NP  metoprolol succinate (TOPROL-XL) 25 MG 24 hr tablet TAKE 1 TABLET (25 MG TOTAL) BY MOUTH DAILY FOR BLOOD PRESSURE 07/27/22   Gildardo Pounds, NP  nitroGLYCERIN (NITROSTAT) 0.4 MG  SL tablet Place 1 tablet (0.4 mg total) under the tongue every 5 (five) minutes as needed for chest pain. Report to ER if you have taken  pills or more in 15 minute timeframe 12/18/21   Gildardo Pounds, NP  omeprazole (PRILOSEC) 40 MG capsule Take 1 capsule (40 mg total) by mouth daily FOR ACID REFLUX 07/27/22   Gildardo Pounds, NP  TRUEplus Lancets 26G MISC 1 each by Does not apply route every 8 (eight) hours as needed. 08/13/20   Elsie Stain, MD      Allergies    Patient has no known allergies.    Review of Systems   Review of Systems  Constitutional:  Positive for appetite change. Negative for activity change,  chills, fatigue and fever.  HENT: Negative.    Respiratory: Negative.    Cardiovascular: Negative.   Gastrointestinal:  Positive for nausea and vomiting. Negative for abdominal pain and diarrhea.  Genitourinary: Negative.   Neurological: Negative.     Physical Exam Updated Vital Signs BP (!) 122/100 (BP Location: Right Arm)   Pulse 68   Temp 98.2 F (36.8 C) (Oral)   Resp 18   Ht _0  (1.727 m)   Wt 91 kg   SpO2 100%   BMI 30.50 kg/m  Physical Exam Vitals and nursing note reviewed.  Constitutional:      Appearance: He is not ill-appearing or toxic-appearing.  HENT:     Head: Normocephalic and atraumatic.     Mouth/Throat:     Mouth: Mucous membranes are moist.     Pharynx: No oropharyngeal exudate or posterior oropharyngeal erythema.  Eyes:     General:        Right eye: No discharge.        Left eye: No discharge.     Extraocular Movements: Extraocular movements intact.     Conjunctiva/sclera: Conjunctivae normal.     Pupils: Pupils are equal, round, and reactive to light.  Cardiovascular:     Rate and Rhythm: Normal rate and regular rhythm.     Pulses: Normal pulses.     Heart sounds: Normal heart sounds. No murmur heard. Pulmonary:     Effort: Pulmonary effort is normal. No respiratory distress.     Breath sounds: Normal breath sounds. No wheezing or rales.  Abdominal:     General: Bowel sounds are normal. There is no distension.     Palpations: Abdomen is soft.     Tenderness: There is no abdominal tenderness. There is no guarding or rebound.  Musculoskeletal:        General: No deformity.     Cervical back: Neck supple.     Right lower leg: No edema.     Left lower leg: No edema.  Skin:    General: Skin is warm and dry.     Capillary Refill: Capillary refill takes less than 2 seconds.  Neurological:     General: No focal deficit present.     Mental Status: He is alert and oriented to person, place, and time. Mental status is at baseline.  Psychiatric:         Mood and Affect: Mood normal.     ED Results / Procedures / Treatments   Labs (all labs ordered are listed, but only abnormal results are displayed) Labs Reviewed  RESP PANEL BY RT-PCR (RSV, FLU A&B, COVID)  [id number]    EKG None  Radiology No results found.  Procedures Procedures   Medications Ordered in ED  Medications - No data to display  ED Course/ Medical Decision Making/ A&P                           Medical Decision Making 51 year old male presents with weakness, nausea vomiting and few episodes of diarrhea.  On intake, vitals otherwise normal.  Cardiopulmonary abdominal exams are benign.  Patient neurovascular intact in all extremities.  Given history of MI with vague symptoms in the past we will proceed with chest pain workup in addition to abdominal workup.  Amount and/or Complexity of Data Reviewed Labs: ordered.    Details: RVP negative.  Risk Prescription drug management.   Patient administered fluids and IV Zofran, resting comfortably at time of my reevaluation.  Care of this patient signed out to oncoming ED provider Cyndi Bender, PA-C at time of shift change.  Patient pending completion of laboratory studies and reevaluation.  Most likely etiology is acute viral gastroenteritis but ultimate diagnosis, disposition and treatment plan to be determined based on completion of workup and clinical judgment of oncoming ED team.  Ollen Gross  voiced understanding of his medical evaluation and treatment plan. Each of their questions answered to their expressed satisfaction.   This chart was dictated using voice recognition software, Dragon. Despite the best efforts of this provider to proofread and correct errors, errors may still occur which can change documentation meaning.    Final Clinical Impression(s) / ED Diagnoses Final diagnoses:  None    Rx / DC Orders ED Discharge Orders     None         Aura Dials 10/10/22 [id number]     Merryl Hacker, MD 10/10/22 [phone]

## 2022-10-10 NOTE — ED Triage Notes (Signed)
Pt. States that he has been having cold and hot flashes, for the past couple of days with nausea and vomiting. States that he has had a HA today thinks his BP may be high.

## 2022-10-10 NOTE — Discharge Instructions (Addendum)
You have been seen today for your complaint of nausea. Your lab work was reassuring and showed no abnormalities. Your discharge medications include Zofran.  This is a nausea medication.  It is a dissolvable tablet.  You should take it as needed to treat nausea. Home care instructions are as follows:  Continue to eat and drink normal diet Follow up with: Your primary care provider in 1 week for reevaluation Please seek immediate medical care if you develop any of the following symptoms: You have pain in your chest, neck, arm, or jaw. You feel extremely weak or you faint. You have vomit that is bright red or looks like coffee grounds. You have bloody or black stools (feces) or stools that look like tar. You have a severe headache, a stiff neck, or both. You have severe pain, cramping, or bloating in your abdomen. You have difficulty breathing or are breathing very quickly. Your heart is beating very quickly. Your skin feels cold and clammy. You feel confused. You have signs of dehydration, such as: Dark urine, very little urine, or no urine. Cracked lips. Dry mouth. Sunken eyes. Sleepiness. Weakness. At this time there does not appear to be the presence of an emergent medical condition, however there is always the potential for conditions to change. Please read and follow the below instructions.  Do not take your medicine if  develop an itchy rash, swelling in your mouth or lips, or difficulty breathing; call 911 and seek immediate emergency medical attention if this occurs.  You may review your lab tests and imaging results in their entirety on your MyChart account.  Please discuss all results of fully with your primary care provider and other specialist at your follow-up visit.  Note: Portions of this text may have been transcribed using voice recognition software. Every effort was made to ensure accuracy; however, inadvertent computerized transcription errors may still be present.

## 2022-10-11 ENCOUNTER — Other Ambulatory Visit (HOSPITAL_COMMUNITY): Payer: Self-pay

## 2022-10-11 ENCOUNTER — Other Ambulatory Visit: Payer: Self-pay

## 2022-10-27 ENCOUNTER — Ambulatory Visit: Payer: Commercial Managed Care - HMO | Admitting: Nurse Practitioner

## 2022-11-25 ENCOUNTER — Other Ambulatory Visit: Payer: Self-pay

## 2022-11-30 ENCOUNTER — Other Ambulatory Visit: Payer: Self-pay

## 2022-12-07 ENCOUNTER — Other Ambulatory Visit: Payer: Self-pay

## 2022-12-08 ENCOUNTER — Other Ambulatory Visit: Payer: Self-pay

## 2022-12-09 ENCOUNTER — Telehealth: Payer: Self-pay

## 2022-12-09 NOTE — Telephone Encounter (Signed)
Patient attempted to be outreached by Junius Finner, PharmD Candidate on 12/09/2022 to discuss hypertension. Unable to leave voicemail as mailbox is full.   [place] [facility]  PharmD Candidate 2024   Maryan Puls, PharmD PGY-1 [facility] Pharmacy Resident

## 2022-12-16 NOTE — Telephone Encounter (Signed)
Patient attempted to be outreached by Junius Finner, PharmD Candidate on 12/16/2022 to discuss hypertension. Unable to leave voicemail because the mailbox is full.   [place] [facility]  PharmD Candidate 2024   Maryan Puls, PharmD PGY-1 [facility] Pharmacy Resident

## 2022-12-20 NOTE — Telephone Encounter (Signed)
Patient attempted to be outreached by Levi Aland, PharmD Candidate on 12/20/22 to discuss hypertension. Unable to leave voicemail as mailbox is full.   Levi Aland, PharmD Candidate, Class of 2024  [place] E. Marsh, PharmD PGY-1 [facility] Resident

## 2022-12-22 ENCOUNTER — Other Ambulatory Visit: Payer: Self-pay

## 2022-12-28 ENCOUNTER — Other Ambulatory Visit: Payer: Self-pay

## 2023-02-08 ENCOUNTER — Other Ambulatory Visit: Payer: Self-pay

## 2023-02-08 ENCOUNTER — Encounter (HOSPITAL_COMMUNITY): Payer: Self-pay | Admitting: Emergency Medicine

## 2023-02-08 ENCOUNTER — Ambulatory Visit (HOSPITAL_COMMUNITY)
Admission: EM | Admit: 2023-02-08 | Discharge: 2023-02-08 | Disposition: A | Discharge status: Home / Self Care | Payer: Commercial Managed Care - HMO | Attending: Emergency Medicine | Admitting: Emergency Medicine

## 2023-02-08 DIAGNOSIS — I498 Other specified cardiac arrhythmias: Secondary | ICD-10-CM

## 2023-02-08 DIAGNOSIS — K0889 Other specified disorders of teeth and supporting structures: Secondary | ICD-10-CM

## 2023-02-08 DIAGNOSIS — R001 Bradycardia, unspecified: Secondary | ICD-10-CM

## 2023-02-08 MED ORDER — NAPROXEN 500 MG PO TABS
500.0000 mg | ORAL_TABLET | Freq: Two times a day (BID) | ORAL | 0 refills | Status: DC
  Filled 2023-02-08: qty 30, 15d supply, fill #0

## 2023-02-08 MED ORDER — AMOXICILLIN-POT CLAVULANATE 875-125 MG PO TABS
1.0000 | ORAL_TABLET | Freq: Two times a day (BID) | ORAL | 0 refills | Status: DC
  Filled 2023-02-08: qty 14, 7d supply, fill #0

## 2023-02-08 NOTE — ED Triage Notes (Signed)
Patient has not purchased medicines due to his belief he did not have insurance, but now thinks he has been paying for insurance and plans to follow up on this

## 2023-02-08 NOTE — Discharge Instructions (Addendum)
Your EKG had some abnormalities. Please call cardiology and set a follow-up appointment for further evaluation. Please seek immediate care if you develop shortness of breath or chest pain.   Below are some dental resources for you to contact. I am covering you with antibiotics and NSAIDs for potential dental infection. You need to get dental treatment for this infection to not re-occur.   [facility] [place] Address: [address] [place], [place], [place] [address] Phone: [phone]  [facility] [phone] extension [phone] [address]  Dr. Lawrence Marseilles [phone] [address]  [place] [phone] [address] [facility] [place].  [facility] [phone] extension [phone] [place], [place], [address] First come first serve for the first 10 clients.  May do simple extractions only, no wisdom teeth or surgery.  You may try the second for Thursday of the month starting at 6:30 AM.  [facility] You may call the school to see if they are still helping to provide dental care for emergent cases.

## 2023-02-08 NOTE — ED Triage Notes (Signed)
Dental infection or abscess on left , lower gum.  Patient reports he needs to have 5 teeth pulled but does not have insurance to do this.  Pain and swelling in mouth and reports pain in left ear.

## 2023-02-08 NOTE — ED Provider Notes (Signed)
[facility]    CSN: [id number] Arrival date & time: 02/08/23  1240      History   Chief Complaint Chief Complaint  Patient presents with   Abscess    HPI Bradley Wolfe. is a 52 y.o. male.    Patient presents to clinic for left lower jaw dental issues and swelling. Reports needing multiple teeth pulled in his mouth.  He currently has a broken molar to his left lower jaw that causes him pain when he eats.  He does notice that his left sided lower jaw is swollen.  No current pain.  Of note, heart rate is 41 on physical exam.  Patient denies any chest pain.  Denies shortness of breath.  Reports intermittent shortness of breath, he does have a long walk from his car to his job will sometimes get short of breath on this walk.  History of coronary artery disease, NSTEMI, hypertension, hyperlipidemia and tobacco abuse.  Has been off of all prescription medications for months due to insurance issues.   The history is provided by the patient and medical records.  Abscess Associated symptoms: no fatigue     Past Medical History:  Diagnosis Date   CAD (coronary artery disease), native coronary artery 12/21/12   a. NSTEMI 2/14 => LHC 12/21/12: mLAD 90%, OM1 20%, mRCA 100%, EF 55-60%.  PCI: Xience Xpedition DES to mRCA and Xience Xpedition DES to mLAD.    Diabetes mellitus without complication    GERD (gastroesophageal reflux disease)    Hyperlipidemia    Hypertension    NSTEMI    Psoriasis    Tobacco abuse     Patient Active Problem List   Diagnosis Date Noted   Type 2 diabetes mellitus 07/01/2017   Psoriasis 07/14/2016   CAD (coronary artery disease), native coronary artery 12/22/2012   Hyperlipidemia 12/22/2012   History of non-ST elevation myocardial infarction (NSTEMI) 12/21/2012   Essential hypertension 12/21/2012   GERD (gastroesophageal reflux disease) 12/21/2012    Past Surgical History:  Procedure Laterality Date   CORONARY ANGIOPLASTY WITH STENT  PLACEMENT  12/21/12   90% mid LAD s/p DES, 20% OM1, 100% mid RCA s/p DES; LVEF 55-60%   LEFT HEART CATHETERIZATION WITH CORONARY ANGIOGRAM N/A 12/21/2012   Procedure: LEFT HEART CATHETERIZATION WITH CORONARY ANGIOGRAM;  Surgeon: Kathleene Hazel, MD;  Location: [facility] CATH LAB;  Service: Cardiovascular;  Laterality: N/A;       Home Medications    Prior to Admission medications   Medication Sig Start Date End Date Taking? Authorizing Provider  amoxicillin-clavulanate (AUGMENTIN) 875-125 MG tablet Take 1 tablet by mouth every 12 (twelve) hours. 02/08/23  Yes Rinaldo Ratel, [place], FNP  gabapentin (NEURONTIN) 600 MG tablet Take 1 tablet (600 mg total) by mouth 3 (three) times daily FOR LEG PAIN 07/27/22  Yes Claiborne Rigg, NP  naproxen (NAPROSYN) 500 MG tablet Take 1 tablet (500 mg total) by mouth 2 (two) times daily. 02/08/23  Yes Rinaldo Ratel, [place] N, FNP  albuterol (VENTOLIN HFA) 108 (90 Base) MCG/ACT inhaler Inhale 2 puffs into the lungs every 6 (six) hours as needed for wheezing or shortness of breath. 10/14/21   Claiborne Rigg, NP  amLODipine (NORVASC) 10 MG tablet Take 1 tablet (10 mg total) by mouth daily FOR BLOOD PRESSURE. Patient not taking: Reported on 02/08/2023 08/05/22   Hoy Register, MD  aspirin EC 81 MG tablet Take 1 tablet (81 mg total) by mouth daily. Swallow whole. 07/27/22   [name]  W, NP  atorvastatin (LIPITOR) 80 MG tablet Take 1 tablet (80 mg total) by mouth daily FOR CHOLESTEROL Patient not taking: Reported on 02/08/2023 07/27/22   Claiborne Rigg, NP  betamethasone dipropionate 0.05 % cream APPLY TOPICALLY TWO TIMES DAILY. Patient not taking: Reported on 02/08/2023 07/27/22   Claiborne Rigg, NP  Blood Glucose Monitoring Suppl (TRUE METRIX METER) w/Device KIT Needs new meter. We tested the his current one with a new battery and it is defective. Patient will pick up scripts today. Patient not taking: Reported on 07/27/2022 04/25/19   Claiborne Rigg, NP  Co[place]  Extract (PSORIASIN EX) Apply 1 application topically as needed (for psoriasis). Patient not taking: Reported on 02/08/2023    [provider]  diclofenac Sodium (VOLTAREN) 1 % GEL Apply 2 g topically 4 (four) times daily FOR LEFT LEG PAIN 07/27/22   Claiborne Rigg, NP  glucose blood test strip Use as instructed Patient not taking: Reported on 07/27/2022 08/13/20   Storm Frisk, MD  hydrochlorothiazide (HYDRODIURIL) 25 MG tablet Take 1 tablet (25 mg total) by mouth daily FOR BLOOD PRESSURE 07/27/22   Claiborne Rigg, NP  HYDROcodone-acetaminophen (NORCO/VICODIN) 5-325 MG tablet Take 1 tablet by mouth every 8 (eight) hours as needed. 06/17/22   Merrilee Jansky, MD  ibuprofen (ADVIL) 600 MG tablet Take 1 tablet (600 mg total) by mouth every 6 (six) hours as needed. 06/17/22   Lamptey, Britta Mccreedy, MD  lidocaine (XYLOCAINE) 2 % solution Use as directed 15 mLs in the mouth or throat as needed for mouth pain. Patient not taking: Reported on 02/08/2023 01/30/22   Haskel Schroeder, PA-C  lisinopril (ZESTRIL) 40 MG tablet Take 1 tablet (40 mg total) by mouth daily FOR BLOOD PRESSURE Patient not taking: Reported on 02/08/2023 07/27/22   Claiborne Rigg, NP  metFORMIN (GLUCOPHAGE) 1000 MG tablet Take 1 tablet (1,000 mg total) by mouth 2 (two) times daily with a meaL FOR Diabetes Patient not taking: Reported on 02/08/2023 07/27/22   Claiborne Rigg, NP  metoprolol succinate (TOPROL-XL) 25 MG 24 hr tablet TAKE 1 TABLET (25 MG TOTAL) BY MOUTH DAILY FOR BLOOD PRESSURE Patient not taking: Reported on 02/08/2023 07/27/22   Claiborne Rigg, NP  nitroGLYCERIN (NITROSTAT) 0.4 MG SL tablet Place 1 tablet (0.4 mg total) under the tongue every 5 (five) minutes as needed for chest pain. Report to ER if you have taken  pills or more in 15 minute timeframe 12/18/21   Claiborne Rigg, NP  omeprazole (PRILOSEC) 40 MG capsule Take 1 capsule (40 mg total) by mouth daily FOR ACID REFLUX Patient not taking: Reported on  02/08/2023 07/27/22   Claiborne Rigg, NP  ondansetron (ZOFRAN-ODT) 4 MG disintegrating tablet Take 1 tablet (4 mg total) by mouth every 8 (eight) hours as needed for nausea or vomiting. 10/10/22   Schutt, Edsel Petrin, PA-C  TRUEplus Lancets 26G MISC 1 each by Does not apply route every 8 (eight) hours as needed. 08/13/20   Storm Frisk, MD    Family History Family History  Problem Relation Age of Onset   Heart attack Mother    Heart attack Father     Social History Social History   Tobacco Use   Smoking status: Former    Packs/day: 0.25    Years: 20.00    Additional pack years: 0.00    Total pack years: 5.00    Types: Cigarettes    Quit date: 02/20/2016  Years since quitting: 6.9   Smokeless tobacco: Never   Tobacco comments:    Smoke [place] OCC.   Vaping Use   Vaping Use: Never used  Substance Use Topics   Alcohol use: Yes    Alcohol/week: 1.0 standard drink of alcohol    Types: 1 Standard drinks or equivalent per week    Comment: rarely    Drug use: Yes    Types: Marijuana     Allergies   Patient has no known allergies.   Review of Systems Review of Systems  Constitutional:  Negative for chills and fatigue.  HENT:  Positive for dental problem.   Respiratory:  Positive for shortness of breath. Negative for cough.   Cardiovascular:  Negative for chest pain.  Gastrointestinal:  Negative for abdominal pain.     Physical Exam Triage Vital Signs ED Triage Vitals  Enc Vitals Group     BP 02/08/23 1325 (!) 168/81     Pulse Rate 02/08/23 1325 (!) 44     Resp 02/08/23 1325 20     Temp 02/08/23 1325 97.8 F (36.6 C)     Temp Source 02/08/23 1325 Oral     SpO2 02/08/23 1325 98 %     Weight --      Height --      Head Circumference --      Peak Flow --      Pain Score 02/08/23 1321 4     Pain Loc --      Pain Edu? --      Excl. in GC? --    No data found.  Updated Vital Signs BP (!) 168/81 (BP Location: Left Arm)   Pulse (!) 44   Temp 97.8  F (36.6 C) (Oral)   Resp 20   SpO2 98%   Visual Acuity Right Eye Distance:   Left Eye Distance:   Bilateral Distance:    Right Eye Near:   Left Eye Near:    Bilateral Near:     Physical Exam Vitals and nursing note reviewed.  Constitutional:      Appearance: Normal appearance.  HENT:     Head: Normocephalic and atraumatic.     Right Ear: External ear normal.     Left Ear: External ear normal.     Nose: Nose normal.     Mouth/Throat:     Mouth: Mucous membranes are moist.     Dentition: Abnormal dentition. Dental tenderness and dental caries present.     Pharynx: Oropharynx is clear. Uvula midline.      Comments: Pain at left back molar, gingival tenderness, pain with eating and drinking. Eyes:     Conjunctiva/sclera: Conjunctivae normal.  Cardiovascular:     Rate and Rhythm: Bradycardia present. Rhythm irregular.     Heart sounds: Normal heart sounds.  Pulmonary:     Effort: Pulmonary effort is normal. No respiratory distress.  Skin:    General: Skin is warm and dry.  Neurological:     Mental Status: He is alert and oriented to person, place, and time.  Psychiatric:        Mood and Affect: Mood normal.        Behavior: Behavior normal. Behavior is cooperative.      UC Treatments / Results  Labs (all labs ordered are listed, but only abnormal results are displayed) Labs Reviewed - No data to display  EKG   Radiology No results found.  Procedures Procedures (including critical care time)  Medications Ordered  in UC Medications - No data to display  Initial Impression / Assessment and Plan / UC Course  I have reviewed the triage vital signs and the nursing notes.  Pertinent labs & imaging results that were available during my care of the patient were reviewed by me and considered in my medical decision making (see chart for details).  Vitals and triage reviewed.  Patient is hypertensive, has been off of antihypertensive medication for months.  Patient  is also bradycardic in the 40s, has been bradycardic at 54 bpm in December of 2023 at an ED visit.  Denies chest pain or current shortness of breath, is occasionally short of breath with the long walk from his car to his job. Has not used tobacco in over 8 years.   EKG in clinic with sinus bradycardia with marked sinus arrhythmia. Without STE or STD. Independently reviewed with Dr. Franklyn Lor, MD. Advised to follow-up with cardiology for further evaluation, patient verbalized understating.   Provided with dental resources, Augmentin and NSAIDs for dental pain. No obvious PTA, uvula midline, no obvious abscess.     Final Clinical Impressions(s) / UC Diagnoses   Final diagnoses:  Pain, dental  Sinus arrhythmia seen on electrocardiogram  Sinus bradycardia     Discharge Instructions      Your EKG had some abnormalities. Please call cardiology and set a follow-up appointment for further evaluation. Please seek immediate care if you develop shortness of breath or chest pain.   Below are some dental resources for you to contact. I am covering you with antibiotics and NSAIDs for potential dental infection. You need to get dental treatment for this infection to not re-occur.   [facility] [place] Address: [address] [place], [place], [place] [address] Phone: [phone]  [facility] [phone] extension [phone] [address]  Dr. Lawrence Marseilles [phone] [address]  [place] [phone] [address] [facility].  [facility] [phone] extension [phone] [place], [place], [address] First come first serve for the first 10 clients.  May do simple extractions only, no wisdom teeth or surgery.  You may try the second for Thursday of the month starting at 6:30 AM.  [facility] You may call the school to see if they are still helping to provide dental care for emergent cases.     ED Prescriptions      Medication Sig Dispense Auth. Provider   amoxicillin-clavulanate (AUGMENTIN) 875-125 MG tablet Take 1 tablet by mouth every 12 (twelve) hours. 14 tablet Rinaldo Ratel, [place] N, [place]   naproxen (NAPROSYN) 500 MG tablet Take 1 tablet (500 mg total) by mouth 2 (two) times daily. 30 tablet Traylen Eckels, [place], [place]      PDMP not reviewed this encounter.   Jefrey Raburn, [place] N, [place] 02/08/23 1556

## 2023-02-18 ENCOUNTER — Telehealth: Payer: Self-pay

## 2023-02-18 NOTE — Telephone Encounter (Signed)
Copied from CRM [id number]. Topic: Appointment Scheduling - Scheduling Inquiry for Clinic >> Feb 18, 2023  1:52 PM [place] wrote: Reason for CRM: Pt stated he recently had a physical done for his employer, and he was advised to reach out to PCP for a second opinion on the recent physical that showed an irregular heartbeat.  Pt is requesting to come in for an appointment as soon as possible. The first available date that pt was scheduled for is 05/15. Pt is requesting an earlier appointment if possible.  Please call pt at [phone] Please advise.

## 2023-02-21 NOTE — Telephone Encounter (Signed)
Return call unanswered. There are no sooner appointments available. Mobile bus is an option.

## 2023-03-07 ENCOUNTER — Other Ambulatory Visit: Payer: Self-pay

## 2023-03-09 ENCOUNTER — Ambulatory Visit: Payer: Self-pay | Attending: Physician Assistant | Admitting: Physician Assistant

## 2023-03-09 VITALS — BP 140/74 | HR 56 | Ht 68.0 in | Wt 201.4 lb

## 2023-03-09 DIAGNOSIS — I491 Atrial premature depolarization: Secondary | ICD-10-CM

## 2023-03-09 DIAGNOSIS — E1165 Type 2 diabetes mellitus with hyperglycemia: Secondary | ICD-10-CM

## 2023-03-09 DIAGNOSIS — I25118 Atherosclerotic heart disease of native coronary artery with other forms of angina pectoris: Secondary | ICD-10-CM

## 2023-03-09 NOTE — Patient Instructions (Signed)
work at a goal of eliminating sugary drinks, candy, desserts, sweets, refined sugars, processed foods, and white carbohydrates.    Drink 80-100 ounces water daily 

## 2023-03-09 NOTE — Progress Notes (Signed)
Patient ID: Bradley Maffucci., male   DOB: April 26, 1971, 52 y.o.   MRN: [id number]   Bradley Wolfe, is a 52 y.o. male  WUJ:[id number]  [id number]  DOB - [birth year]  Chief Complaint  Patient presents with   low heart rate     Patient reports no chest pain or shortness of breath        Subjective:   Bradley Wolfe is a 52 y.o. male here today for a "second opinion" about his heart.  He apparently had some sort of work physical and they said he has a "low heart rate."  However, I see his history is + for NSTEMI and recent EKG showed PACs.  He says his MI was 8 years ago.  He has not had f/up by cardiology in years.  He denies CP/SOB/awareness of abnormal beats/paresthesias.  He is not taking metformin due to GI upset.  His last A1C was 7 months ago and 7.2.  has been off BP meds for a while and restarted them yesterday.  No problems updated.  ALLERGIES: No Known Allergies  PAST MEDICAL HISTORY: Past Medical History:  Diagnosis Date   CAD (coronary artery disease), native coronary artery 12/21/12   a. NSTEMI 2/14 => LHC 12/21/12: mLAD 90%, OM1 20%, mRCA 100%, EF 55-60%.  PCI: Xience Xpedition DES to mRCA and Xience Xpedition DES to mLAD.    Diabetes mellitus without complication (HCC)    GERD (gastroesophageal reflux disease)    Hyperlipidemia    Hypertension    NSTEMI    Psoriasis    Tobacco abuse     MEDICATIONS AT HOME: Prior to Admission medications   Medication Sig Start Date End Date Taking? Authorizing Provider  albuterol (VENTOLIN HFA) 108 (90 Base) MCG/ACT inhaler Inhale 2 puffs into the lungs every 6 (six) hours as needed for wheezing or shortness of breath. 10/14/21  Yes Claiborne Rigg, NP  amLODipine (NORVASC) 10 MG tablet Take 1 tablet (10 mg total) by mouth daily FOR BLOOD PRESSURE. 08/05/22  Yes Hoy Register, MD  amoxicillin-clavulanate (AUGMENTIN) 875-125 MG tablet Take 1 tablet by mouth every 12 (twelve) hours. 02/08/23  Yes Rinaldo Ratel, [place] N, FNP   aspirin EC 81 MG tablet Take 1 tablet (81 mg total) by mouth daily. Swallow whole. 07/27/22  Yes Claiborne Rigg, NP  atorvastatin (LIPITOR) 80 MG tablet Take 1 tablet (80 mg total) by mouth daily FOR CHOLESTEROL 07/27/22  Yes Claiborne Rigg, NP  betamethasone dipropionate 0.05 % cream APPLY TOPICALLY TWO TIMES DAILY. 07/27/22  Yes Claiborne Rigg, NP  Blood Glucose Monitoring Suppl (TRUE METRIX METER) w/Device KIT Needs new meter. We tested the his current one with a new battery and it is defective. Patient will pick up scripts today. 04/25/19  Yes Claiborne Rigg, NP  [employer] Extract (PSORIASIN EX) Apply 1 application  topically as needed (for psoriasis).   Yes [provider]  diclofenac Sodium (VOLTAREN) 1 % GEL Apply 2 g topically 4 (four) times daily FOR LEFT LEG PAIN 07/27/22  Yes Claiborne Rigg, NP  gabapentin (NEURONTIN) 600 MG tablet Take 1 tablet (600 mg total) by mouth 3 (three) times daily FOR LEG PAIN 07/27/22  Yes Claiborne Rigg, NP  glucose blood test strip Use as instructed 08/13/20  Yes Storm Frisk, MD  hydrochlorothiazide (HYDRODIURIL) 25 MG tablet Take 1 tablet (25 mg total) by mouth daily FOR BLOOD PRESSURE 07/27/22  Yes Claiborne Rigg, NP  HYDROcodone-acetaminophen (NORCO/VICODIN) 5-325 MG  tablet Take 1 tablet by mouth every 8 (eight) hours as needed. 06/17/22  Yes Lamptey, Britta Mccreedy, MD  ibuprofen (ADVIL) 600 MG tablet Take 1 tablet (600 mg total) by mouth every 6 (six) hours as needed. 06/17/22  Yes Lamptey, Britta Mccreedy, MD  lidocaine (XYLOCAINE) 2 % solution Use as directed 15 mLs in the mouth or throat as needed for mouth pain. 01/30/22  Yes Haskel Schroeder, PA-C  lisinopril (ZESTRIL) 40 MG tablet Take 1 tablet (40 mg total) by mouth daily FOR BLOOD PRESSURE 07/27/22  Yes Claiborne Rigg, NP  metoprolol succinate (TOPROL-XL) 25 MG 24 hr tablet TAKE 1 TABLET (25 MG TOTAL) BY MOUTH DAILY FOR BLOOD PRESSURE 07/27/22  Yes Claiborne Rigg, NP  naproxen (NAPROSYN)  500 MG tablet Take 1 tablet (500 mg total) by mouth 2 (two) times daily. 02/08/23  Yes Rinaldo Ratel, [place] N, FNP  nitroGLYCERIN (NITROSTAT) 0.4 MG SL tablet Place 1 tablet (0.4 mg total) under the tongue every 5 (five) minutes as needed for chest pain. Report to ER if you have taken  pills or more in 15 minute timeframe 12/18/21  Yes Claiborne Rigg, NP  omeprazole (PRILOSEC) 40 MG capsule Take 1 capsule (40 mg total) by mouth daily FOR ACID REFLUX 07/27/22  Yes Claiborne Rigg, NP  ondansetron (ZOFRAN-ODT) 4 MG disintegrating tablet Take 1 tablet (4 mg total) by mouth every 8 (eight) hours as needed for nausea or vomiting. 10/10/22  Yes Schutt, Edsel Petrin, PA-C  TRUEplus Lancets 26G MISC 1 each by Does not apply route every 8 (eight) hours as needed. 08/13/20  Yes Storm Frisk, MD    ROS: Neg HEENT Neg resp Neg cardiac Neg GI Neg GU Neg MS Neg psych Neg neuro  Objective:   Vitals:   03/09/23 1047  BP: (!) 160/93  Pulse: (!) 56  SpO2: 100%  Weight: 201 lb 6.4 oz (91.4 kg)  Height: 5\' 8"  (1.727 m)   Exam General appearance : Awake, alert, not in any distress. Speech Clear. Not toxic looking HEENT: Atraumatic and Normocephalic Neck: Supple, no JVD. No cervical lymphadenopathy.  Chest: Good air entry bilaterally, CTAB.  No rales/rhonchi/wheezing CVS: S1 S2 regular but with occasional ectopic beat, no murmurs.  Extremities: B/L Lower Ext shows no edema, both legs are warm to touch Neurology: Awake alert, and oriented X 3, CN II-XII intact, Non focal Skin: No Rash  Data Review Lab Results  Component Value Date   HGBA1C 7.2 (A) 07/27/2022   HGBA1C 5.5 12/18/2021   HGBA1C 5.5 12/25/2020    Assessment & Plan   1. Coronary artery disease involving native coronary artery of native heart with other form of angina pectoris (HCC) Has been lost to follow up.  Poor adherence to medication regimen and appointments - Ambulatory referral to Cardiology  2. Type 2 diabetes mellitus  with hyperglycemia, unspecified whether long term insulin use (HCC) If still elevated, will consider for liberate study and glipizide - Hemoglobin A1c  3. PAC (premature atrial contraction) asymptomatic - Ambulatory referral to Cardiology  Take BP meds and check BP OOO and record.  Return in about 3 months (around 06/09/2023) for PCP Chronic conditions.  The patient was given clear instructions to go to ER or return to medical center if symptoms don't improve, worsen or new problems develop. The patient verbalized understanding. The patient was told to call to get lab results if they haven't heard anything in the next week.    Georgian Co,  PA-C [facility] [place], [place] [phone]   03/09/2023, 12:24 PM

## 2023-03-10 LAB — HEMOGLOBIN A1C
Est. average glucose Bld gHb Est-mCnc: 134 mg/dL
Hgb A1c MFr Bld: 6.3 % — ABNORMAL HIGH (ref 4.8–5.6)

## 2023-04-04 ENCOUNTER — Other Ambulatory Visit: Payer: Self-pay

## 2023-04-05 ENCOUNTER — Ambulatory Visit: Payer: Self-pay | Attending: Nurse Practitioner | Admitting: Nurse Practitioner

## 2023-04-05 ENCOUNTER — Encounter: Payer: Self-pay | Admitting: Nurse Practitioner

## 2023-04-05 VITALS — BP 133/68 | HR 51 | Ht 68.0 in | Wt 198.0 lb

## 2023-04-05 DIAGNOSIS — I25118 Atherosclerotic heart disease of native coronary artery with other forms of angina pectoris: Secondary | ICD-10-CM

## 2023-04-05 DIAGNOSIS — E1165 Type 2 diabetes mellitus with hyperglycemia: Secondary | ICD-10-CM

## 2023-04-05 DIAGNOSIS — I252 Old myocardial infarction: Secondary | ICD-10-CM

## 2023-04-05 DIAGNOSIS — Z1211 Encounter for screening for malignant neoplasm of colon: Secondary | ICD-10-CM

## 2023-04-05 DIAGNOSIS — I10 Essential (primary) hypertension: Secondary | ICD-10-CM

## 2023-04-05 NOTE — Patient Instructions (Signed)
Placed in [facility] [place] [facility] [address] Ph# [phone]  

## 2023-04-05 NOTE — Progress Notes (Signed)
Assessment & Plan:  Bradley Wolfe was seen today for hypertension.  Diagnoses and all orders for this visit:  Primary hypertension Continue all antihypertensives as prescribed.  Reminded to bring in blood pressure log for follow  up appointment.  RECOMMENDATIONS: DASH/Mediterranean Diets are healthier choices for HTN.    Type 2 diabetes mellitus with hyperglycemia, unspecified whether long term insulin use (HCC) -     Microalbumin / creatinine urine ratio  Colon cancer screening -     Fecal occult blood, imunochemical  Coronary artery disease involving native coronary artery of native heart with other form of angina pectoris (HCC) -     Ambulatory referral to Cardiology  History of non-ST elevation myocardial infarction (NSTEMI) -     Ambulatory referral to Cardiology  Low calcium levels -     CMP14+EGFR    Patient has been counseled on age-appropriate routine health concerns for screening and prevention. These are reviewed and up-to-date. Referrals have been placed accordingly. Immunizations are up-to-date or declined.    Subjective:   Chief Complaint  Patient presents with   Hypertension   HPI Bradley Wolfe. 52 y.o. male presents to office today for follow up to HTN.   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   Diabetes is well-controlled. Lab Results  Component Value Date   HGBA1C 6.3 (H) 03/09/2023    HTN Blood pressure is well-controlled with amlodipine 10  mg, hydrochlorothiazide 25 mg, lisinopril 40 mg daily and Toprol-XL 25 mg daily. BP Readings from Last 3 Encounters:  04/05/23 133/68  03/09/23 (!) 140/74  02/08/23 (!) 168/81       Review of Systems  Constitutional:  Negative for fever, malaise/fatigue and weight loss.  HENT: Negative.  Negative for nosebleeds.   Eyes: Negative.  Negative for blurred vision, double vision and photophobia.  Respiratory: Negative.  Negative for cough and shortness of breath.   Cardiovascular: Negative.  Negative for chest pain, palpitations and leg swelling.  Gastrointestinal: Negative.  Negative for heartburn, nausea and vomiting.  Musculoskeletal: Negative.  Negative for myalgias.  Neurological: Negative.  Negative for dizziness, focal weakness, seizures and headaches.  Psychiatric/Behavioral: Negative.  Negative for suicidal ideas.     Past Medical History:  Diagnosis Date   CAD (coronary artery disease), native coronary artery 12/21/12   a. NSTEMI 2/14 => LHC 12/21/12: mLAD 90%, OM1 20%, mRCA 100%, EF 55-60%.  PCI: Xience Xpedition DES to mRCA and Xience Xpedition DES to mLAD.    Diabetes mellitus without complication (HCC)    GERD (gastroesophageal reflux disease)    Hyperlipidemia    Hypertension    NSTEMI    Psoriasis    Tobacco abuse     Past Surgical History:  Procedure Laterality Date   CORONARY ANGIOPLASTY WITH STENT PLACEMENT  12/21/12   90% mid LAD s/p DES, 20% OM1, 100% mid RCA s/p DES; LVEF 55-60%   LEFT HEART CATHETERIZATION WITH CORONARY ANGIOGRAM N/A 12/21/2012  Procedure: LEFT HEART CATHETERIZATION WITH CORONARY ANGIOGRAM;  Surgeon: Kathleene Hazel, MD;  Location: [facility] CATH LAB;  Service: Cardiovascular;  Laterality: N/A;    Family History  Problem Relation Age of Onset   Heart attack Mother    Heart attack Father     Social History Reviewed with no changes to be made today.   Outpatient Medications Prior to Visit  Medication Sig Dispense Refill   albuterol  (VENTOLIN HFA) 108 (90 Base) MCG/ACT inhaler Inhale 2 puffs into the lungs every 6 (six) hours as needed for wheezing or shortness of breath. 18 g 1   amLODipine (NORVASC) 10 MG tablet Take 1 tablet (10 mg total) by mouth daily FOR BLOOD PRESSURE. 90 tablet 1   aspirin EC 81 MG tablet Take 1 tablet (81 mg total) by mouth daily. Swallow whole. 30 tablet 12   atorvastatin (LIPITOR) 80 MG tablet Take 1 tablet (80 mg total) by mouth daily FOR CHOLESTEROL 90 tablet 1   betamethasone dipropionate 0.05 % cream APPLY TOPICALLY TWO TIMES DAILY. 45 g 2   Blood Glucose Monitoring Suppl (TRUE METRIX METER) w/Device KIT Needs new meter. We tested the his current one with a new battery and it is defective. Patient will pick up scripts today. 1 kit 0   Coal Tar Extract (PSORIASIN EX) Apply 1 application  topically as needed (for psoriasis).     diclofenac Sodium (VOLTAREN) 1 % GEL Apply 2 g topically 4 (four) times daily FOR LEFT LEG PAIN 200 g 3   gabapentin (NEURONTIN) 600 MG tablet Take 1 tablet (600 mg total) by mouth 3 (three) times daily FOR LEG PAIN 270 tablet 1   glucose blood test strip Use as instructed 100 each 12   hydrochlorothiazide (HYDRODIURIL) 25 MG tablet Take 1 tablet (25 mg total) by mouth daily FOR BLOOD PRESSURE 90 tablet 3   HYDROcodone-acetaminophen (NORCO/VICODIN) 5-325 MG tablet Take 1 tablet by mouth every 8 (eight) hours as needed. 12 tablet 0   ibuprofen (ADVIL) 600 MG tablet Take 1 tablet (600 mg total) by mouth every 6 (six) hours as needed. 30 tablet 0   lidocaine (XYLOCAINE) 2 % solution Use as directed 15 mLs in the mouth or throat as needed for mouth pain. 300 mL 0   lisinopril (ZESTRIL) 40 MG tablet Take 1 tablet (40 mg total) by mouth daily FOR BLOOD PRESSURE 90 tablet 1   metoprolol succinate (TOPROL-XL) 25 MG 24 hr tablet TAKE 1 TABLET (25 MG TOTAL) BY MOUTH DAILY FOR BLOOD PRESSURE 90 tablet 1   naproxen (NAPROSYN) 500 MG tablet Take 1 tablet (500 mg total) by mouth 2 (two)  times daily. 30 tablet 0   nitroGLYCERIN (NITROSTAT) 0.4 MG SL tablet Place 1 tablet (0.4 mg total) under the tongue every 5 (five) minutes as needed for chest pain. Report to ER if you have taken  pills or more in 15 minute timeframe 25 tablet 1   omeprazole (PRILOSEC) 40 MG capsule Take 1 capsule (40 mg total) by mouth daily FOR ACID REFLUX 90 capsule 1   ondansetron (ZOFRAN-ODT) 4 MG disintegrating tablet Take 1 tablet (4 mg total) by mouth every 8 (eight) hours as needed for nausea or vomiting. 20 tablet 0   TRUEplus Lancets 26G MISC 1 each by Does not apply route every 8 (eight) hours as needed. 100 each 12   amoxicillin-clavulanate (AUGMENTIN) 875-125 MG tablet Take 1 tablet by mouth every 12 (twelve) hours. 14 tablet 0   No facility-administered  medications prior to visit.    No Known Allergies     Objective:    BP 133/68 (BP Location: Left Arm, Patient Position: Sitting, Cuff Size: Normal)   Pulse (!) 51   Ht 5\' 8"  (1.727 m)   Wt 198 lb (89.8 kg)   SpO2 99%   BMI 30.11 kg/m  Wt Readings from Last 3 Encounters:  04/05/23 198 lb (89.8 kg)  03/09/23 201 lb 6.4 oz (91.4 kg)  10/10/22 200 lb 9.9 oz (91 kg)    Physical Exam Vitals and nursing note reviewed.  Constitutional:      Appearance: He is well-developed.  HENT:     Head: Normocephalic and atraumatic.  Cardiovascular:     Rate and Rhythm: Regular rhythm. Bradycardia present.     Heart sounds: Normal heart sounds. No murmur heard.    No friction rub. No gallop.  Pulmonary:     Effort: Pulmonary effort is normal. No tachypnea or respiratory distress.     Breath sounds: Normal breath sounds. No decreased breath sounds, wheezing, rhonchi or rales.  Chest:     Chest wall: No tenderness.  Abdominal:     General: Bowel sounds are normal.     Palpations: Abdomen is soft.  Musculoskeletal:        General: Normal range of motion.     Cervical back: Normal range of motion.  Skin:    General: Skin is warm and dry.   Neurological:     Mental Status: He is alert and oriented to person, place, and time.     Coordination: Coordination normal.  Psychiatric:        Behavior: Behavior normal. Behavior is cooperative.        Thought Content: Thought content normal.        Judgment: Judgment normal.          Patient has been counseled extensively about nutrition and exercise as well as the importance of adherence with medications and regular follow-up. The patient was given clear instructions to go to ER or return to medical center if symptoms don't improve, worsen or new problems develop. The patient verbalized understanding.   Follow-up: Return in about 3 months (around 07/06/2023).   Claiborne Rigg, FNP-BC [facility] [place], [place] [phone]   04/05/2023, 1:46 PM

## 2023-04-06 LAB — CMP14+EGFR
ALT: 20 IU/L (ref 0–44)
AST: 24 IU/L (ref 0–40)
Albumin/Globulin Ratio: 1.8
Albumin: 4.6 g/dL (ref 3.8–4.9)
Alkaline Phosphatase: 120 IU/L (ref 44–121)
BUN/Creatinine Ratio: 20 (ref 9–20)
BUN: 23 mg/dL (ref 6–24)
Bilirubin Total: 0.2 mg/dL (ref 0.0–1.2)
CO2: 24 mmol/L (ref 20–29)
Calcium: 9.1 mg/dL (ref 8.7–10.2)
Chloride: 103 mmol/L (ref 96–106)
Creatinine, Ser: 1.15 mg/dL (ref 0.76–1.27)
Globulin, Total: 2.5 g/dL (ref 1.5–4.5)
Glucose: 91 mg/dL (ref 70–99)
Potassium: 4.5 mmol/L (ref 3.5–5.2)
Sodium: 141 mmol/L (ref 134–144)
Total Protein: 7.1 g/dL (ref 6.0–8.5)
eGFR: 77 mL/min/{1.73_m2} (ref >59)

## 2023-04-06 LAB — MICROALBUMIN / CREATININE URINE RATIO
Creatinine, Urine: 175.4 mg/dL
Microalb/Creat Ratio: 4 mg/g creat (ref 0–29)
Microalbumin, Urine: 6.3 ug/mL

## 2023-04-08 ENCOUNTER — Other Ambulatory Visit: Payer: Self-pay

## 2023-04-14 ENCOUNTER — Other Ambulatory Visit: Payer: Self-pay

## 2023-04-15 ENCOUNTER — Other Ambulatory Visit: Payer: Self-pay

## 2023-04-16 LAB — FECAL OCCULT BLOOD, IMMUNOCHEMICAL: Fecal Occult Bld: NEGATIVE

## 2023-04-22 ENCOUNTER — Other Ambulatory Visit: Payer: Self-pay

## 2023-05-03 NOTE — Progress Notes (Unsigned)
No chief complaint on file.     History of Present Illness: 52 yo with history of CAD, DM, GERD, HLD, HTN and tobacco abuse who is here today to reestablish care. He is followed in our office by Dr. Jens Som but has not been seen since December 2020. He had a NSTEMI in 2014 and had drug eluting stents placed in the LAD and the RCA. Last echo in 2017 with normal LV systolic function and no valve disease.   He tells me today that he ***  Primary Care Physician: Claiborne Rigg, NP   Past Medical History:  Diagnosis Date   CAD (coronary artery disease), native coronary artery 12/21/12   a. NSTEMI 2/14 => LHC 12/21/12: mLAD 90%, OM1 20%, mRCA 100%, EF 55-60%.  PCI: Xience Xpedition DES to mRCA and Xience Xpedition DES to mLAD.    Diabetes mellitus without complication (HCC)    GERD (gastroesophageal reflux disease)    Hyperlipidemia    Hypertension    NSTEMI    Psoriasis    Tobacco abuse     Past Surgical History:  Procedure Laterality Date   CORONARY ANGIOPLASTY WITH STENT PLACEMENT  12/21/12   90% mid LAD s/p DES, 20% OM1, 100% mid RCA s/p DES; LVEF 55-60%   LEFT HEART CATHETERIZATION WITH CORONARY ANGIOGRAM N/A 12/21/2012   Procedure: LEFT HEART CATHETERIZATION WITH CORONARY ANGIOGRAM;  Surgeon: Kathleene Hazel, MD;  Location: [facility] CATH LAB;  Service: Cardiovascular;  Laterality: N/A;    Current Outpatient Medications  Medication Sig Dispense Refill   albuterol (VENTOLIN HFA) 108 (90 Base) MCG/ACT inhaler Inhale 2 puffs into the lungs every 6 (six) hours as needed for wheezing or shortness of breath. 18 g 1   amLODipine (NORVASC) 10 MG tablet Take 1 tablet (10 mg total) by mouth daily FOR BLOOD PRESSURE. 90 tablet 1   aspirin EC 81 MG tablet Take 1 tablet (81 mg total) by mouth daily. Swallow whole. 30 tablet 12   atorvastatin (LIPITOR) 80 MG tablet Take 1 tablet (80 mg total) by mouth daily FOR CHOLESTEROL 90 tablet 1   betamethasone dipropionate 0.05 % cream APPLY  TOPICALLY TWO TIMES DAILY. 45 g 2   Blood Glucose Monitoring Suppl (TRUE METRIX METER) w/Device KIT Needs new meter. We tested the his current one with a new battery and it is defective. Patient will pick up scripts today. 1 kit 0   Coal Tar Extract (PSORIASIN EX) Apply 1 application  topically as needed (for psoriasis).     diclofenac Sodium (VOLTAREN) 1 % GEL Apply 2 g topically 4 (four) times daily FOR LEFT LEG PAIN 200 g 3   gabapentin (NEURONTIN) 600 MG tablet Take 1 tablet (600 mg total) by mouth 3 (three) times daily FOR LEG PAIN 270 tablet 1   glucose blood test strip Use as instructed 100 each 12   hydrochlorothiazide (HYDRODIURIL) 25 MG tablet Take 1 tablet (25 mg total) by mouth daily FOR BLOOD PRESSURE 90 tablet 3   HYDROcodone-acetaminophen (NORCO/VICODIN) 5-325 MG tablet Take 1 tablet by mouth every 8 (eight) hours as needed. 12 tablet 0   ibuprofen (ADVIL) 600 MG tablet Take 1 tablet (600 mg total) by mouth every 6 (six) hours as needed. 30 tablet 0   lidocaine (XYLOCAINE) 2 % solution Use as directed 15 mLs in the mouth or throat as needed for mouth pain. 300 mL 0   lisinopril (ZESTRIL) 40 MG tablet Take 1 tablet (40 mg total) by mouth daily  FOR BLOOD PRESSURE 90 tablet 1   metoprolol succinate (TOPROL-XL) 25 MG 24 hr tablet TAKE 1 TABLET (25 MG TOTAL) BY MOUTH DAILY FOR BLOOD PRESSURE 90 tablet 1   naproxen (NAPROSYN) 500 MG tablet Take 1 tablet (500 mg total) by mouth 2 (two) times daily. 30 tablet 0   nitroGLYCERIN (NITROSTAT) 0.4 MG SL tablet Place 1 tablet (0.4 mg total) under the tongue every 5 (five) minutes as needed for chest pain. Report to ER if you have taken  pills or more in 15 minute timeframe 25 tablet 1   omeprazole (PRILOSEC) 40 MG capsule Take 1 capsule (40 mg total) by mouth daily FOR ACID REFLUX 90 capsule 1   ondansetron (ZOFRAN-ODT) 4 MG disintegrating tablet Take 1 tablet (4 mg total) by mouth every 8 (eight) hours as needed for nausea or vomiting. 20 tablet 0    TRUEplus Lancets 26G MISC 1 each by Does not apply route every 8 (eight) hours as needed. 100 each 12   No current facility-administered medications for this visit.    No Known Allergies  Social History   Socioeconomic History   Marital status: Single    Spouse name: Not on file   Number of children: Not on file   Years of education: Not on file   Highest education level: Not on file  Occupational History   Not on file  Tobacco Use   Smoking status: Former    Packs/day: 0.25    Years: 20.00    Additional pack years: 0.00    Total pack years: 5.00    Types: Cigarettes    Quit date: 02/20/2016    Years since quitting: 7.2   Smokeless tobacco: Never   Tobacco comments:    Smoke [place] OCC.   Vaping Use   Vaping Use: Former  Substance and Sexual Activity   Alcohol use: Not Currently    Alcohol/week: 1.0 standard drink of alcohol    Types: 1 Standard drinks or equivalent per week    Comment: rarely    Drug use: Yes    Types: Marijuana   Sexual activity: Not on file  Other Topics Concern   Not on file  Social History Narrative   Not on file   Social Determinants of Health   Financial Resource Strain: Not on file  Food Insecurity: Not on file  Transportation Needs: Not on file  Physical Activity: Not on file  Stress: Not on file  Social Connections: Not on file  Intimate Partner Violence: Not on file    Family History  Problem Relation Age of Onset   Heart attack Mother    Heart attack Father     Review of Systems:  As stated in the HPI and otherwise negative.   There were no vitals taken for this visit.  Physical Examination: General: Well developed, well nourished, NAD  HEENT: OP clear, mucus membranes moist  SKIN: warm, dry. No rashes. Neuro: No focal deficits  Musculoskeletal: Muscle strength 5/5 all ext  Psychiatric: Mood and affect normal  Neck: No JVD, no carotid bruits, no thyromegaly, no lymphadenopathy.  Lungs:Clear bilaterally, no  wheezes, rhonci, crackles Cardiovascular: Regular rate and rhythm. No murmurs, gallops or rubs. Abdomen:Soft. Bowel sounds present. Non-tender.  Extremities: No lower extremity edema. Pulses are 2 + in the bilateral DP/PT.  EKG:  EKG {ACTION; IS/IS [id number]} ordered today. The ekg ordered today demonstrates ***  Recent Labs: 10/10/2022: Hemoglobin 15.3; Platelets 227 04/05/2023: ALT 20; BUN 23; Creatinine, Ser 1.15;  Potassium 4.5; Sodium 141   Lipid Panel    Component Value Date/Time   CHOL 125 12/18/2021 0936   TRIG 49 12/18/2021 0936   HDL 71 12/18/2021 0936   CHOLHDL 1.8 12/18/2021 0936   CHOLHDL 2 05/15/2015 1102   VLDL 11.8 05/15/2015 1102   LDLCALC 42 12/18/2021 0936     Wt Readings from Last 3 Encounters:  04/05/23 89.8 kg  03/09/23 91.4 kg  10/10/22 91 kg      Assessment and Plan:   1. CAD without angina:   2. HTN:   3. Hyperlipidemia:   Labs/ tests ordered today include:  No orders of the defined types were placed in this encounter.    Disposition:   F/U with Dr. Jens Som in ***     Signed, Verne Carrow, MD, [facility] 05/03/2023 11:58 AM    [facility] [address] [place], [place], [place]  [address] Phone: [phone]; Fax: [phone]

## 2023-05-04 ENCOUNTER — Ambulatory Visit: Payer: 59 | Attending: Cardiovascular Disease | Admitting: Cardiovascular Disease

## 2023-05-04 ENCOUNTER — Encounter: Payer: Self-pay | Admitting: Cardiovascular Disease

## 2023-05-04 ENCOUNTER — Other Ambulatory Visit: Payer: Self-pay

## 2023-05-04 VITALS — BP 152/92 | HR 64 | Ht 68.0 in | Wt 193.0 lb

## 2023-05-04 DIAGNOSIS — I2510 Atherosclerotic heart disease of native coronary artery without angina pectoris: Secondary | ICD-10-CM

## 2023-05-04 DIAGNOSIS — I10 Essential (primary) hypertension: Secondary | ICD-10-CM

## 2023-05-04 DIAGNOSIS — E7800 Pure hypercholesterolemia, unspecified: Secondary | ICD-10-CM

## 2023-05-04 MED ORDER — METOPROLOL SUCCINATE ER 25 MG PO TB24
25.0000 mg | ORAL_TABLET | Freq: Every day | ORAL | 3 refills | Status: DC
Start: 2023-05-04 — End: 2023-07-11
  Filled 2023-05-04 – 2023-05-16 (×4): qty 90, 90d supply, fill #0

## 2023-05-04 MED ORDER — LISINOPRIL 40 MG PO TABS
40.0000 mg | ORAL_TABLET | Freq: Every day | ORAL | 3 refills | Status: DC
  Filled 2023-05-04 – 2023-05-16 (×4): qty 90, 90d supply, fill #0
  Filled 2023-08-15: qty 90, 90d supply, fill #1
  Filled 2024-04-10: qty 30, 30d supply, fill #2

## 2023-05-04 MED ORDER — ATORVASTATIN CALCIUM 80 MG PO TABS
80.0000 mg | ORAL_TABLET | Freq: Every day | ORAL | 3 refills | Status: DC
  Filled 2023-05-04 – 2023-05-13 (×3): qty 90, 90d supply, fill #0
  Filled 2023-08-15: qty 90, 90d supply, fill #1
  Filled 2024-04-10: qty 30, 30d supply, fill #2

## 2023-05-04 MED ORDER — AMLODIPINE BESYLATE 10 MG PO TABS
10.0000 mg | ORAL_TABLET | Freq: Every day | ORAL | 3 refills | Status: DC
  Filled 2023-05-04 – 2023-05-13 (×3): qty 90, 90d supply, fill #0
  Filled 2023-08-15: qty 90, 90d supply, fill #1

## 2023-05-04 NOTE — Patient Instructions (Signed)
Medication Instructions:  Your heart medications have been refilled.  Please take as directed per your mediation list attached.  *If you need a refill on your cardiac medications before your next appointment, please call your pharmacy*   Lab Work: Today: bmet, lipids, liver function  If you have labs (blood work) drawn today and your tests are completely normal, you will receive your results only by: MyChart Message (if you have MyChart) OR A paper copy in the mail If you have any lab test that is abnormal or we need to change your treatment, we will call you to review the results.   Testing/Procedures: none   Follow-Up: At [facility], you and your health needs are our priority.  As part of our continuing mission to provide you with exceptional heart care, we have created designated Provider Care Teams.  These Care Teams include your primary Cardiologist (physician) and Advanced Practice Providers (APPs -  Physician Assistants and Nurse Practitioners) who all work together to provide you with the care you need, when you need it.  We recommend signing up for the patient portal called "MyChart".  Sign up information is provided on this After Visit Summary.  MyChart is used to connect with patients for Virtual Visits (Telemedicine).  Patients are able to view lab/test results, encounter notes, upcoming appointments, etc.  Non-urgent messages can be sent to your provider as well.   To learn more about what you can do with MyChart, go to [email or web address].    Your next appointment:   12 month(s)  Provider:   Dr. Olga Millers

## 2023-05-05 LAB — LIPID PANEL
Chol/HDL Ratio: 1.9 ratio (ref 0.0–5.0)
Cholesterol, Total: 122 mg/dL (ref 100–199)
HDL: 63 mg/dL (ref >39)
LDL Chol Calc (NIH): 48 mg/dL (ref 0–99)
Triglycerides: 42 mg/dL (ref 0–149)
VLDL Cholesterol Cal: 11 mg/dL (ref 5–40)

## 2023-05-05 LAB — BASIC METABOLIC PANEL
BUN/Creatinine Ratio: 12 (ref 9–20)
BUN: 12 mg/dL (ref 6–24)
CO2: 25 mmol/L (ref 20–29)
Calcium: 9.1 mg/dL (ref 8.7–10.2)
Chloride: 103 mmol/L (ref 96–106)
Creatinine, Ser: 1.02 mg/dL (ref 0.76–1.27)
Glucose: 85 mg/dL (ref 70–99)
Potassium: 4.3 mmol/L (ref 3.5–5.2)
Sodium: 140 mmol/L (ref 134–144)
eGFR: 88 mL/min/{1.73_m2} (ref >59)

## 2023-05-05 LAB — HEPATIC FUNCTION PANEL
ALT: 22 IU/L (ref 0–44)
AST: 19 IU/L (ref 0–40)
Albumin: 4.6 g/dL (ref 3.8–4.9)
Alkaline Phosphatase: 117 IU/L (ref 44–121)
Bilirubin Total: 0.3 mg/dL (ref 0.0–1.2)
Bilirubin, Direct: 0.11 mg/dL (ref 0.00–0.40)
Total Protein: 7 g/dL (ref 6.0–8.5)

## 2023-05-10 ENCOUNTER — Other Ambulatory Visit: Payer: Self-pay

## 2023-05-13 ENCOUNTER — Other Ambulatory Visit: Payer: Self-pay

## 2023-05-13 ENCOUNTER — Other Ambulatory Visit (HOSPITAL_COMMUNITY): Payer: Self-pay

## 2023-05-16 ENCOUNTER — Other Ambulatory Visit: Payer: Self-pay

## 2023-05-17 ENCOUNTER — Other Ambulatory Visit (HOSPITAL_COMMUNITY): Payer: Self-pay

## 2023-07-11 ENCOUNTER — Encounter: Payer: Self-pay | Admitting: Nurse Practitioner

## 2023-07-11 ENCOUNTER — Other Ambulatory Visit: Payer: Self-pay

## 2023-07-11 ENCOUNTER — Ambulatory Visit: Payer: 59 | Attending: Nurse Practitioner | Admitting: Nurse Practitioner

## 2023-07-11 VITALS — BP 136/73 | HR 44 | Ht 68.0 in | Wt 193.6 lb

## 2023-07-11 DIAGNOSIS — E1165 Type 2 diabetes mellitus with hyperglycemia: Secondary | ICD-10-CM | POA: Diagnosis not present

## 2023-07-11 DIAGNOSIS — I10 Essential (primary) hypertension: Secondary | ICD-10-CM | POA: Diagnosis not present

## 2023-07-11 DIAGNOSIS — Z23 Encounter for immunization: Secondary | ICD-10-CM | POA: Diagnosis not present

## 2023-07-11 DIAGNOSIS — K219 Gastro-esophageal reflux disease without esophagitis: Secondary | ICD-10-CM

## 2023-07-11 MED ORDER — OMEPRAZOLE 40 MG PO CPDR
40.0000 mg | DELAYED_RELEASE_CAPSULE | Freq: Every day | ORAL | 1 refills | Status: AC
Start: 2023-07-11 — End: ?
  Filled 2023-07-11 – 2023-09-29 (×2): qty 90, 90d supply, fill #0
  Filled 2024-04-10 – 2024-05-09 (×2): qty 30, 30d supply, fill #1

## 2023-07-11 MED ORDER — METOPROLOL SUCCINATE ER 25 MG PO TB24
12.5000 mg | ORAL_TABLET | Freq: Every day | ORAL | 3 refills | Status: DC
Start: 2023-07-11 — End: 2024-08-29
  Filled 2023-07-11 – 2023-09-29 (×2): qty 45, 90d supply, fill #0
  Filled 2024-01-22: qty 45, 90d supply, fill #1
  Filled 2024-05-09: qty 45, 90d supply, fill #2
  Filled 2024-07-06: qty 45, 90d supply, fill #3

## 2023-07-11 NOTE — Progress Notes (Signed)
Assessment & Plan:  Bradley Wolfe was seen today for medical management of chronic issues.  Diagnoses and all orders for this visit:  Type 2 diabetes mellitus with hyperglycemia, without long-term current use of insulin (HCC) Continue blood sugar control as discussed in office today, low carbohydrate diet, and regular physical exercise as tolerated, 150 minutes per week (30 min each day, 5 days per week, or 50 min 3 days per week). Keep blood sugar logs with fasting goal of 90-130 mg/dl, post prandial (after you eat) less than 180.  For Hypoglycemia: BS <60 and Hyperglycemia BS >400; contact the clinic ASAP. Annual eye exams and foot exams are recommended.   GERD without esophagitis -     omeprazole (PRILOSEC) 40 MG capsule; Take 1 capsule (40 mg total) by mouth daily FOR ACID REFLUX INSTRUCTIONS: Avoid GERD Triggers: acidic, spicy or fried foods, caffeine, coffee, sodas,  alcohol and chocolate.    Need for shingles vaccine -     Varicella-zoster vaccine IM  Primary hypertension Metoprolol decreased today due to bradycardia -     metoprolol succinate (TOPROL-XL) 25 MG 24 hr tablet; Take 0.5 tablets (12.5 mg total) by mouth daily for blood pressure.    Patient has been counseled on age-appropriate routine health concerns for screening and prevention. These are reviewed and up-to-date. Referrals have been placed accordingly. Immunizations are up-to-date or declined.    Subjective:   Chief Complaint  Patient presents with   Medical Management of Chronic Issues   HPI [name]. 52 y.o. male presents to office today to follow up to HTN.  He has a past medical history of CAD, native coronary artery (12/21/12), DM2, GERD, Hyperlipidemia, Hypertension, NSTEMI, Psoriasis, and Tobacco abuse   HTN Blood pressure is at goal.  He cannot name any of the medications that he is currently taking and does not have that with him today.  He is currently prescribed amlodipine 10 mg daily,  lisinopril 40 mg daily and Toprol-XL 25 mg daily.  Heart rate in the 40s today.  At this time we will decrease Toprol-XL from 25 mg to 12.5 mg. BP Readings from Last 3 Encounters:  07/11/23 136/73  05/04/23 (!) 152/92  04/05/23 133/68     DM 2 Diabetes is well-controlled with diet and A1c at goal of less than 6.5 currently Lab Results  Component Value Date   HGBA1C 6.3 (H) 03/09/2023  LDL at goal.  She is on high intensity statin due to history of L5. Lab Results  Component Value Date   LDLCALC 48 05/04/2023     Endorses neck and shoulder stiffness over the past month which has currently been improving over the past several days due to an OTC roll-on topical analgesic he has been applying.      Review of Systems  Constitutional:  Negative for fever, malaise/fatigue and weight loss.  HENT: Negative.  Negative for nosebleeds.   Eyes: Negative.  Negative for blurred vision, double vision and photophobia.  Respiratory: Negative.  Negative for cough and shortness of breath.   Cardiovascular: Negative.  Negative for chest pain, palpitations and leg swelling.  Gastrointestinal: Negative.  Negative for heartburn, nausea and vomiting.  Musculoskeletal:  Positive for myalgias and neck pain.  Neurological: Negative.  Negative for dizziness, focal weakness, seizures and headaches.  Psychiatric/Behavioral: Negative.  Negative for suicidal ideas.     Past Medical History:  Diagnosis Date   CAD (coronary artery disease), native coronary artery 12/21/12   a. NSTEMI 2/14 =>  LHC 12/21/12: mLAD 90%, OM1 20%, mRCA 100%, EF 55-60%.  PCI: Xience Xpedition DES to mRCA and Xience Xpedition DES to mLAD.    Diabetes mellitus without complication (HCC)    GERD (gastroesophageal reflux disease)    Hyperlipidemia    Hypertension    NSTEMI    Psoriasis    Tobacco abuse     Past Surgical History:  Procedure Laterality Date   CORONARY ANGIOPLASTY WITH STENT PLACEMENT  12/21/12   90% mid LAD s/p DES,  20% OM1, 100% mid RCA s/p DES; LVEF 55-60%   LEFT HEART CATHETERIZATION WITH CORONARY ANGIOGRAM N/A 12/21/2012   Procedure: LEFT HEART CATHETERIZATION WITH CORONARY ANGIOGRAM;  Surgeon: Kathleene Hazel, MD;  Location: [facility] CATH LAB;  Service: Cardiovascular;  Laterality: N/A;    Family History  Problem Relation Age of Onset   Heart attack Mother    Heart attack Father     Social History Reviewed with no changes to be made today.   Outpatient Medications Prior to Visit  Medication Sig Dispense Refill   albuterol (VENTOLIN HFA) 108 (90 Base) MCG/ACT inhaler Inhale 2 puffs into the lungs every 6 (six) hours as needed for wheezing or shortness of breath. 18 g 1   amLODipine (NORVASC) 10 MG tablet Take 1 tablet (10 mg total) by mouth daily FOR BLOOD PRESSURE. 90 tablet 3   aspirin EC 81 MG tablet Take 1 tablet (81 mg total) by mouth daily. Swallow whole. 30 tablet 12   atorvastatin (LIPITOR) 80 MG tablet Take 1 tablet (80 mg total) by mouth daily FOR CHOLESTEROL 90 tablet 3   Blood Glucose Monitoring Suppl (TRUE METRIX METER) w/Device KIT Needs new meter. We tested the his current one with a new battery and it is defective. Patient will pick up scripts today. 1 kit 0   gabapentin (NEURONTIN) 600 MG tablet Take 1 tablet (600 mg total) by mouth 3 (three) times daily FOR LEG PAIN 270 tablet 1   glucose blood test strip Use as instructed 100 each 12   lisinopril (ZESTRIL) 40 MG tablet Take 1 tablet (40 mg total) by mouth daily FOR BLOOD PRESSURE 90 tablet 3   nitroGLYCERIN (NITROSTAT) 0.4 MG SL tablet Place 1 tablet (0.4 mg total) under the tongue every 5 (five) minutes as needed for chest pain. Report to ER if you have taken  pills or more in 15 minute timeframe 25 tablet 1   TRUEplus Lancets 26G MISC 1 each by Does not apply route every 8 (eight) hours as needed. 100 each 12   betamethasone dipropionate 0.05 % cream APPLY TOPICALLY TWO TIMES DAILY. 45 g 2   Coal Tar Extract (PSORIASIN EX)  Apply 1 application  topically as needed (for psoriasis).     diclofenac Sodium (VOLTAREN) 1 % GEL Apply 2 g topically 4 (four) times daily FOR LEFT LEG PAIN 200 g 3   HYDROcodone-acetaminophen (NORCO/VICODIN) 5-325 MG tablet Take 1 tablet by mouth every 8 (eight) hours as needed. 12 tablet 0   ibuprofen (ADVIL) 600 MG tablet Take 1 tablet (600 mg total) by mouth every 6 (six) hours as needed. 30 tablet 0   lidocaine (XYLOCAINE) 2 % solution Use as directed 15 mLs in the mouth or throat as needed for mouth pain. 300 mL 0   metoprolol succinate (TOPROL-XL) 25 MG 24 hr tablet Take 1 tablet (25 mg total) by mouth daily. 90 tablet 3   naproxen (NAPROSYN) 500 MG tablet Take 1 tablet (500 mg total) by  mouth 2 (two) times daily. 30 tablet 0   ondansetron (ZOFRAN-ODT) 4 MG disintegrating tablet Take 1 tablet (4 mg total) by mouth every 8 (eight) hours as needed for nausea or vomiting. 20 tablet 0   omeprazole (PRILOSEC) 40 MG capsule Take 1 capsule (40 mg total) by mouth daily FOR ACID REFLUX (Patient not taking: Reported on 07/11/2023) 90 capsule 1   No facility-administered medications prior to visit.    No Known Allergies     Objective:    BP 136/73 (BP Location: Left Arm, Patient Position: Sitting, Cuff Size: Normal)   Pulse (!) 44   Ht 5\' 8"  (1.727 m)   Wt 193 lb 9.6 oz (87.8 kg)   SpO2 99%   BMI 29.44 kg/m  Wt Readings from Last 3 Encounters:  07/11/23 193 lb 9.6 oz (87.8 kg)  05/04/23 193 lb (87.5 kg)  04/05/23 198 lb (89.8 kg)    Physical Exam Vitals and nursing note reviewed.  Constitutional:      Appearance: He is well-developed.  HENT:     Head: Normocephalic and atraumatic.  Cardiovascular:     Rate and Rhythm: Regular rhythm. Bradycardia present.     Heart sounds: Normal heart sounds. No murmur heard.    No friction rub. No gallop.     Comments: Asymptomatic Pulmonary:     Effort: Pulmonary effort is normal. No tachypnea or respiratory distress.     Breath sounds:  Normal breath sounds. No decreased breath sounds, wheezing, rhonchi or rales.  Chest:     Chest wall: No tenderness.  Abdominal:     General: Bowel sounds are normal.     Palpations: Abdomen is soft.  Musculoskeletal:        General: Normal range of motion.     Cervical back: Normal range of motion.  Skin:    General: Skin is warm and dry.  Neurological:     Mental Status: He is alert and oriented to person, place, and time.     Coordination: Coordination normal.  Psychiatric:        Behavior: Behavior normal. Behavior is cooperative.        Thought Content: Thought content normal.        Judgment: Judgment normal.          Patient has been counseled extensively about nutrition and exercise as well as the importance of adherence with medications and regular follow-up. The patient was given clear instructions to go to ER or return to medical center if symptoms don't improve, worsen or new problems develop. The patient verbalized understanding.   Follow-up: Return in about 3 months (around 10/10/2023).   Claiborne Rigg, FNP-BC [facility] [place], [place] [phone]   07/11/2023, 11:41 AM

## 2023-07-18 ENCOUNTER — Other Ambulatory Visit: Payer: Self-pay

## 2023-07-28 ENCOUNTER — Other Ambulatory Visit: Payer: Self-pay

## 2023-08-15 ENCOUNTER — Other Ambulatory Visit: Payer: Self-pay

## 2023-08-15 ENCOUNTER — Other Ambulatory Visit: Payer: Self-pay | Admitting: Nurse Practitioner

## 2023-08-15 DIAGNOSIS — E1142 Type 2 diabetes mellitus with diabetic polyneuropathy: Secondary | ICD-10-CM

## 2023-08-15 DIAGNOSIS — L409 Psoriasis, unspecified: Secondary | ICD-10-CM

## 2023-08-16 MED ORDER — GABAPENTIN 600 MG PO TABS
600.0000 mg | ORAL_TABLET | Freq: Three times a day (TID) | ORAL | 0 refills | Status: DC
Start: 2023-08-16 — End: 2023-10-10
  Filled 2023-08-16 – 2023-09-29 (×2): qty 270, 90d supply, fill #0

## 2023-08-16 NOTE — Telephone Encounter (Signed)
Requested medication (s) are due for refill today: No  Requested medication (s) are on the active medication list: No  Last refill:  12/18/21  Future visit scheduled: Yes  Notes to clinic:  Manual review.    Requested Prescriptions  Pending Prescriptions Disp Refills   betamethasone dipropionate 0.05 % cream 45 g 1    Sig: APPLY TOPICALLY TWO TIMES DAILY.     Off-Protocol Failed - 08/15/2023  4:04 PM      Failed - Medication not assigned to a protocol, review manually.      Passed - Valid encounter within last 12 months    Recent Outpatient Visits           1 month ago Primary hypertension   [place] [facility] [place], [place] W, NP   4 months ago Primary hypertension   [place] [facility] [place], [place] W, NP   5 months ago Coronary artery disease involving native coronary artery of native heart with other form of angina pectoris [facility])   [place] [facility] [place], [place], [place]   1 year ago Essential hypertension   [place] [facility] [place], [place], NP   1 year ago Essential hypertension   [place] [facility] [place], [place] Stakes, NP       Future Appointments             In 1 month Claiborne Rigg, NP [place] [facility]            Signed Prescriptions Disp Refills   gabapentin (NEURONTIN) 600 MG tablet 270 tablet 0    Sig: Take 1 tablet (600 mg total) by mouth 3 (three) times daily FOR LEG PAIN     Neurology: Anticonvulsants - gabapentin Passed - 08/15/2023  4:04 PM      Passed - Cr in normal range and within 360 days    Creat  Date Value Ref Range Status  09/28/2016 1.02 0.60 - 1.35 mg/dL Final   Creatinine, Ser  Date Value Ref Range Status  05/04/2023 1.02 0.76 - 1.27 mg/dL Final   Creatinine,U  Date Value Ref Range Status  12/21/2012 93.0 mg/dL Final    Comment:    (NOTE) Cutoff  Values for Urine Drug Screen:        Drug Class           Cutoff (ng/mL)        Amphetamines            1000        Barbiturates             200        Cocaine Metabolites      300        Benzodiazepines          200        Methadone                300        Opiates                 2000        Phencyclidine             25        Propoxyphene             300        Marijuana  Metabolites     50 For medical purposes only.         Passed - Completed PHQ-2 or PHQ-9 in the last 360 days      Passed - Valid encounter within last 12 months    Recent Outpatient Visits           1 month ago Primary hypertension   [place] [facility] [place], [place], NP   4 months ago Primary hypertension   [place] [facility] [place], [place] W, NP   5 months ago Coronary artery disease involving native coronary artery of native heart with other form of angina pectoris [facility])   [place] [facility] [place], Marzella Schlein, [place]   1 year ago Essential hypertension   [place] [facility] [place], Shea Stakes, NP   1 year ago Essential hypertension   [place] [facility] [place] Claiborne Rigg, NP       Future Appointments             In 1 month Claiborne Rigg, NP [facility]

## 2023-08-16 NOTE — Telephone Encounter (Signed)
Requested Prescriptions  Pending Prescriptions Disp Refills   betamethasone dipropionate 0.05 % cream 45 g 1    Sig: APPLY TOPICALLY TWO TIMES DAILY.     Off-Protocol Failed - 08/15/2023  4:04 PM      Failed - Medication not assigned to a protocol, review manually.      Passed - Valid encounter within last 12 months    Recent Outpatient Visits           1 month ago Primary hypertension   [place] [facility] [place], [place] W, NP   4 months ago Primary hypertension   [place] [facility] [place], [place] W, NP   5 months ago Coronary artery disease involving native coronary artery of native heart with other form of angina pectoris [facility])   [place] [facility] [place], [place], [place]   1 year ago Essential hypertension   [place] [place], [place], NP   1 year ago Essential hypertension   [place] [facility] & [facility] [place], Shea Stakes, NP       Future Appointments             In 1 month Claiborne Rigg, NP  [facility]             gabapentin (NEURONTIN) 600 MG tablet 270 tablet 0    Sig: Take 1 tablet (600 mg total) by mouth 3 (three) times daily FOR LEG PAIN     Neurology: Anticonvulsants - gabapentin Passed - 08/15/2023  4:04 PM      Passed - Cr in normal range and within 360 days    Creat  Date Value Ref Range Status  09/28/2016 1.02 0.60 - 1.35 mg/dL Final   Creatinine, Ser  Date Value Ref Range Status  05/04/2023 1.02 0.76 - 1.27 mg/dL Final   Creatinine,U  Date Value Ref Range Status  12/21/2012 93.0 mg/dL Final    Comment:    (NOTE) Cutoff Values for Urine Drug Screen:        Drug Class           Cutoff (ng/mL)        Amphetamines            1000        Barbiturates             200        Cocaine Metabolites      300        Benzodiazepines          200        Methadone                300         Opiates                 2000        Phencyclidine             25        Propoxyphene             300        Marijuana Metabolites     50 For medical purposes only.         Passed - Completed PHQ-2 or PHQ-9 in the last 360 days      Passed - Valid encounter within last 12 months  Recent Outpatient Visits           1 month ago Primary hypertension   [place] [facility] [place], Shea Stakes, NP   4 months ago Primary hypertension   [place] [facility] [place], [place] W, NP   5 months ago Coronary artery disease involving native coronary artery of native heart with other form of angina pectoris [facility])   [place] [facility] [place], [place]zella [place]lein, [place]   1 year ago Essential hypertension   [place] [facility] [place], Shea Stakes, NP   1 year ago Essential hypertension   [place] [facility] Claiborne Rigg, NP       Future Appointments             In 1 month Claiborne Rigg, NP [facility]

## 2023-08-17 ENCOUNTER — Other Ambulatory Visit: Payer: Self-pay

## 2023-08-21 MED ORDER — BETAMETHASONE DIPROPIONATE 0.05 % EX CREA
1.0000 | TOPICAL_CREAM | Freq: Two times a day (BID) | CUTANEOUS | 1 refills | Status: AC
Start: 2023-08-21 — End: 2024-08-20
  Filled 2023-08-21 – 2023-09-29 (×2): qty 45, 30d supply, fill #0

## 2023-08-22 ENCOUNTER — Other Ambulatory Visit: Payer: Self-pay

## 2023-08-29 ENCOUNTER — Other Ambulatory Visit: Payer: Self-pay

## 2023-09-01 ENCOUNTER — Other Ambulatory Visit: Payer: Self-pay

## 2023-09-29 ENCOUNTER — Other Ambulatory Visit: Payer: Self-pay

## 2023-09-30 ENCOUNTER — Other Ambulatory Visit: Payer: Self-pay

## 2023-10-10 ENCOUNTER — Encounter: Payer: Self-pay | Admitting: Nurse Practitioner

## 2023-10-10 ENCOUNTER — Ambulatory Visit: Payer: 59 | Attending: Nurse Practitioner | Admitting: Nurse Practitioner

## 2023-10-10 VITALS — BP 136/72 | HR 56 | Temp 97.8°F | Resp 20 | Ht 68.0 in | Wt 198.8 lb

## 2023-10-10 DIAGNOSIS — E1165 Type 2 diabetes mellitus with hyperglycemia: Secondary | ICD-10-CM | POA: Diagnosis not present

## 2023-10-10 DIAGNOSIS — Z0289 Encounter for other administrative examinations: Secondary | ICD-10-CM

## 2023-10-10 DIAGNOSIS — E1142 Type 2 diabetes mellitus with diabetic polyneuropathy: Secondary | ICD-10-CM

## 2023-10-10 DIAGNOSIS — I10 Essential (primary) hypertension: Secondary | ICD-10-CM | POA: Diagnosis not present

## 2023-10-10 LAB — POCT GLYCOSYLATED HEMOGLOBIN (HGB A1C): Hemoglobin A1C: 7.1 % — AB (ref 4.0–5.6)

## 2023-10-10 MED ORDER — GABAPENTIN 600 MG PO TABS
600.0000 mg | ORAL_TABLET | Freq: Three times a day (TID) | ORAL | 0 refills | Status: AC
  Filled 2023-10-10: qty 270, 90d supply, fill #0
  Filled 2024-04-10: qty 90, 30d supply, fill #0
  Filled 2024-05-09: qty 270, 90d supply, fill #0
  Filled 2024-08-29: qty 90, 30d supply, fill #0
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
## 2023-10-11 ENCOUNTER — Other Ambulatory Visit: Payer: Self-pay

## 2023-10-11 LAB — CMP14+EGFR
ALT: 24 [IU]/L (ref 0–44)
AST: 22 [IU]/L (ref 0–40)
Albumin: 4.2 g/dL (ref 3.8–4.9)
Alkaline Phosphatase: 115 [IU]/L (ref 44–121)
BUN/Creatinine Ratio: 15 (ref 9–20)
BUN: 16 mg/dL (ref 6–24)
Bilirubin Total: 0.3 mg/dL (ref 0.0–1.2)
CO2: 21 mmol/L (ref 20–29)
Calcium: 8.6 mg/dL — ABNORMAL LOW (ref 8.7–10.2)
Chloride: 104 mmol/L (ref 96–106)
Creatinine, Ser: 1.09 mg/dL (ref 0.76–1.27)
Globulin, Total: 2.3 g/dL (ref 1.5–4.5)
Glucose: 117 mg/dL — ABNORMAL HIGH (ref 70–99)
Potassium: 4.3 mmol/L (ref 3.5–5.2)
Sodium: 142 mmol/L (ref 134–144)
Total Protein: 6.5 g/dL (ref 6.0–8.5)
eGFR: 81 mL/min/{1.73_m2} (ref >59)

## 2023-11-15 ENCOUNTER — Other Ambulatory Visit: Payer: Self-pay

## 2023-11-15 ENCOUNTER — Encounter (HOSPITAL_COMMUNITY): Payer: Self-pay | Admitting: Emergency Medicine

## 2023-11-15 ENCOUNTER — Emergency Department (HOSPITAL_COMMUNITY)
Admission: EM | Admit: 2023-11-15 | Discharge: 2023-11-15 | Disposition: A | Discharge status: Home / Self Care | Payer: 59 | Attending: Emergency Medicine | Admitting: Emergency Medicine

## 2023-11-15 DIAGNOSIS — K0889 Other specified disorders of teeth and supporting structures: Secondary | ICD-10-CM | POA: Diagnosis present

## 2023-11-15 MED ORDER — AMOXICILLIN 500 MG PO CAPS
500.0000 mg | ORAL_CAPSULE | Freq: Once | ORAL | Status: AC
  Administered 2023-11-15: 500 mg via ORAL
  Filled 2023-11-15: qty 1

## 2023-11-15 MED ORDER — AMOXICILLIN 500 MG PO CAPS
500.0000 mg | ORAL_CAPSULE | Freq: Three times a day (TID) | ORAL | 0 refills | Status: AC
  Filled 2023-11-15: qty 30, 10d supply, fill #0

## 2023-11-15 MED ORDER — OXYCODONE-ACETAMINOPHEN 5-325 MG PO TABS
1.0000 | ORAL_TABLET | Freq: Once | ORAL | Status: AC
  Administered 2023-11-15: 1 via ORAL
  Filled 2023-11-15: qty 1

## 2023-11-15 NOTE — ED Triage Notes (Signed)
Pt reports tooth pain on lower left since yesterday, denies ear pain, neck pain

## 2023-11-15 NOTE — ED Provider Notes (Signed)
[facility]-EMERGENCY DEPT [facility] Emergency Department Provider Note MRN:  [id number]  Arrival date & time: 11/15/23     Chief Complaint   Dental Pain   History of Present Illness   Bradley Wolfe. is a 53 y.o. year-old male presents to the ED with chief complaint of dental pain that started yesterday.  Reports that his back left lower molar broke a while ago.  Now he is having pain.  He doesn't have a dentist.  He denies any successful treatments PTA.  History provided by patient.   Review of Systems  Pertinent positive and negative review of systems noted in HPI.    Physical Exam   Vitals:   11/15/23 0558  BP: (!) 149/81  Pulse: 63  Resp: 18  Temp: 98.2 F (36.8 C)  SpO2: 100%    CONSTITUTIONAL:  Non toxic-appearing, NAD NEURO:  Alert and oriented x 3, CN 3-12 grossly intact EYES:  eyes equal and reactive ENT/NECK:  Supple, no stridor, poor dentition throughout, no visible abscess CARDIO:  normal rate, appears well-perfused  PULM:  No respiratory distress,  GI/GU:  non-distended,  MSK/SPINE:  No gross deformities, no edema, moves all extremities  SKIN:  no rash, atraumatic   *Additional and/or pertinent findings included in MDM below  Diagnostic and Interventional Summary    EKG Interpretation Date/Time:    Ventricular Rate:    PR Interval:    QRS Duration:    QT Interval:    QTC Calculation:   R Axis:      Text Interpretation:         Labs Reviewed - No data to display  No orders to display    Medications  amoxicillin (AMOXIL) capsule 500 mg (has no administration in time range)  oxyCODONE-acetaminophen (PERCOCET/ROXICET) 5-325 MG per tablet 1 tablet (has no administration in time range)     Procedures  /  Critical Care Procedures  ED Course and Medical Decision Making  I have reviewed the triage vital signs, the nursing notes, and pertinent available records from the EMR.  Social Determinants Affecting Complexity of  Care: Patient has no clinically significant social determinants affecting this chief complaint..   ED Course:    Medical Decision Making Patient with dentalgia.  No abscess requiring immediate incision and drainage.  Exam not concerning for Ludwig's angina or pharyngeal abscess.  Will treat with amox. Pt instructed to follow-up with dentist.  Discussed return precautions. Pt safe for discharge.   Risk Prescription drug management.         Consultants: No consultations were needed in caring for this patient.   Treatment and Plan: Emergency department workup does not suggest an emergent condition requiring admission or immediate intervention beyond  what has been performed at this time. The patient is safe for discharge and has  been instructed to return immediately for worsening symptoms, change in  symptoms or any other concerns    Final Clinical Impressions(s) / ED Diagnoses     ICD-10-CM   1. Pain, dental  K08.89       ED Discharge Orders          Ordered    amoxicillin (AMOXIL) 500 MG capsule  3 times daily        11/15/23 0608              Discharge Instructions Discussed with and Provided to Patient:   Discharge Instructions   None      Roxy Horseman, PA-C 11/15/23 [id number]  Melene Plan, DO 11/15/23 [phone]

## 2024-01-09 ENCOUNTER — Ambulatory Visit: Payer: 59 | Admitting: Nurse Practitioner

## 2024-01-19 IMAGING — CT CT HEAD W/O CM
4 series · 17 of 47 positions shown, 19 images · non-contrast
Comparison: None.

CLINICAL DATA: Headache for several days, intermittent nausea,
drowsiness



[Series 3: head without · axial · non-contrast · 0.44mm/px · z∈[-194,-68]mm · 7 of 35 slices shown, 9 images]
[im 5/35  brain]
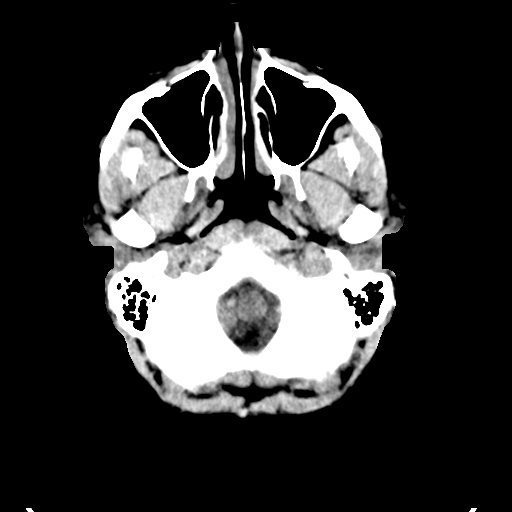
[im 5/35  bone]
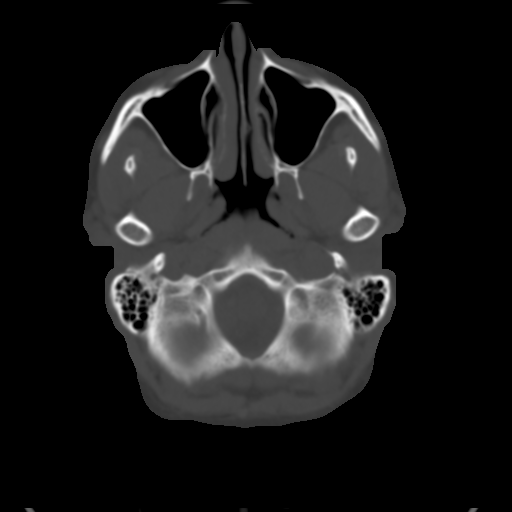
[im 9/35  brain]
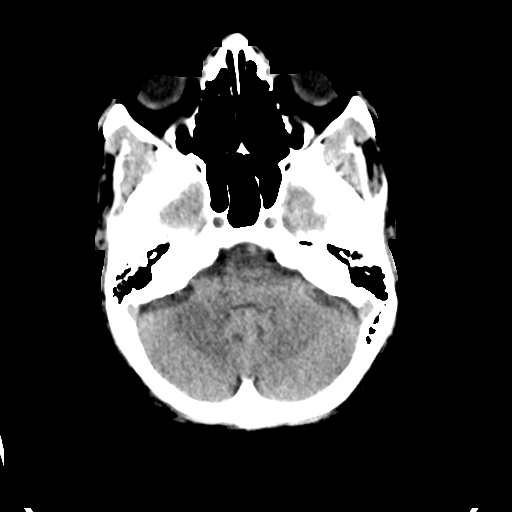
[im 13/35  brain]
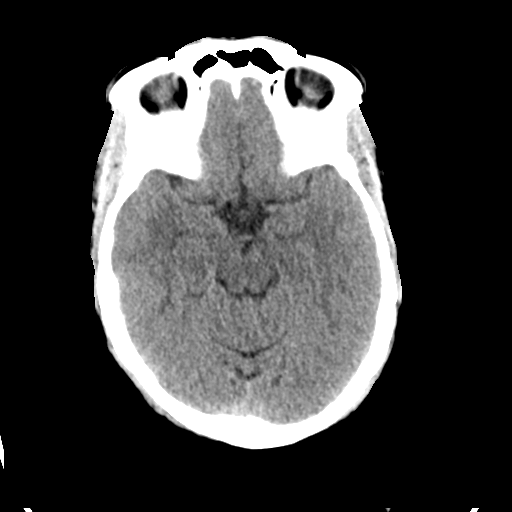
[im 18/35  brain]
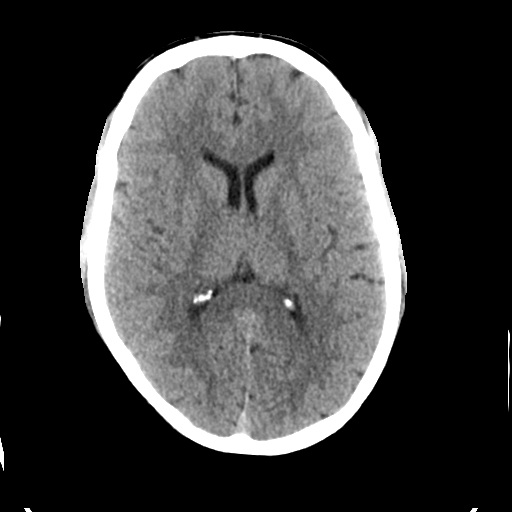
[im 22/35  brain]
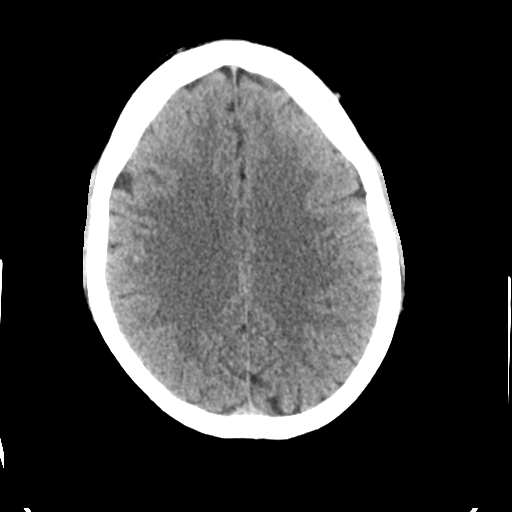
[im 22/35  bone]
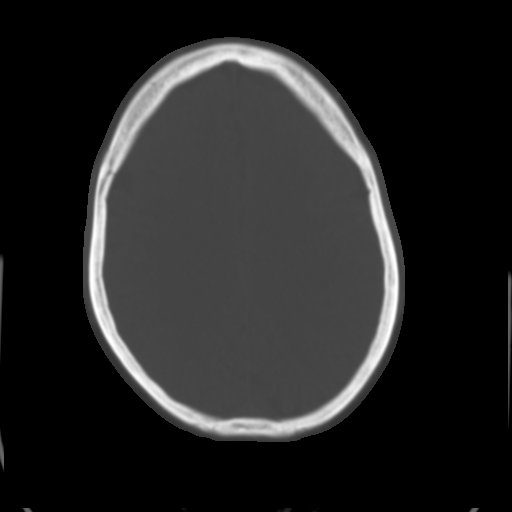
[im 26/35  brain]
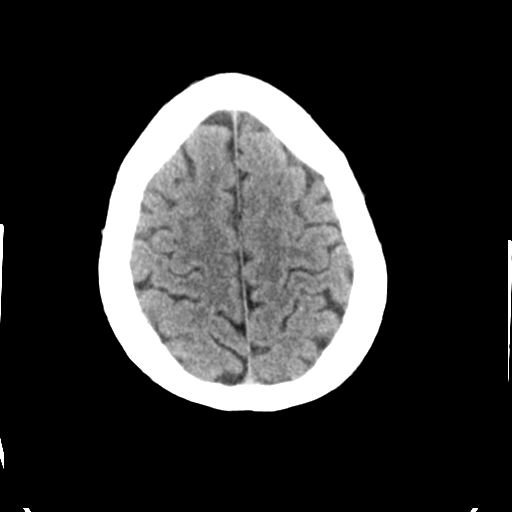
[im 30/35  brain]
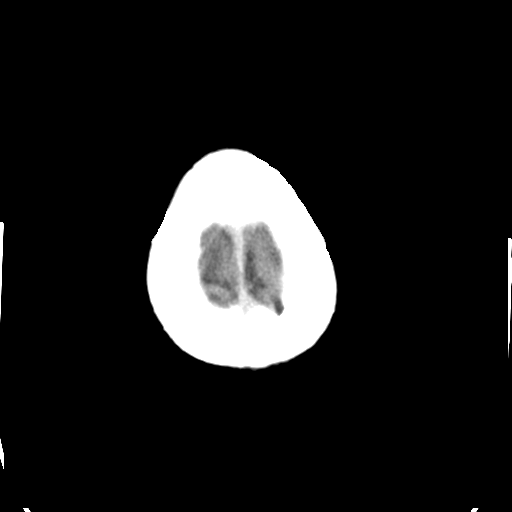

[Series 4: head bone · axial · 0.44mm/px · z∈[-198,-138]mm · 4 of 87 slices shown]
[im 9/87  bone]
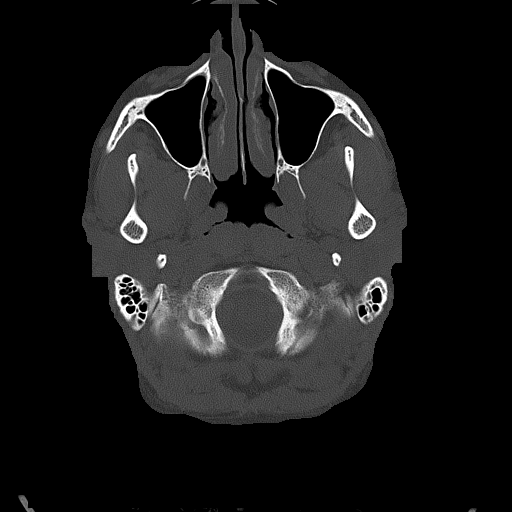
[im 18/87  bone]
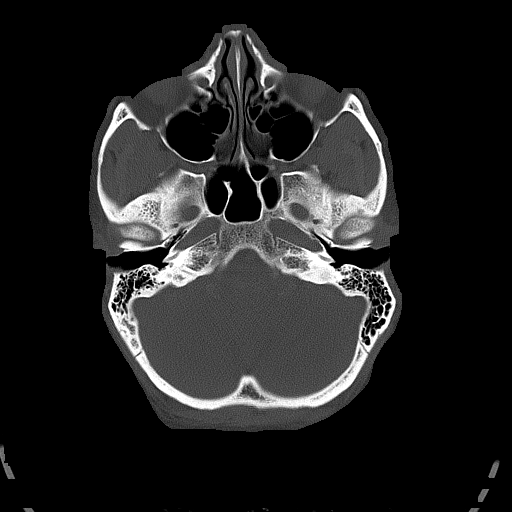
[im 26/87  bone]
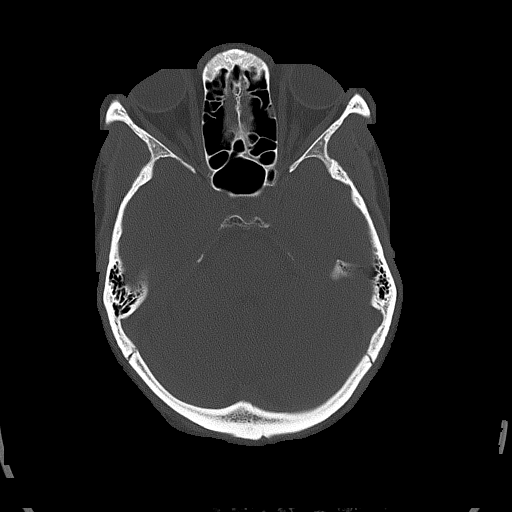
[im 39/87  bone]
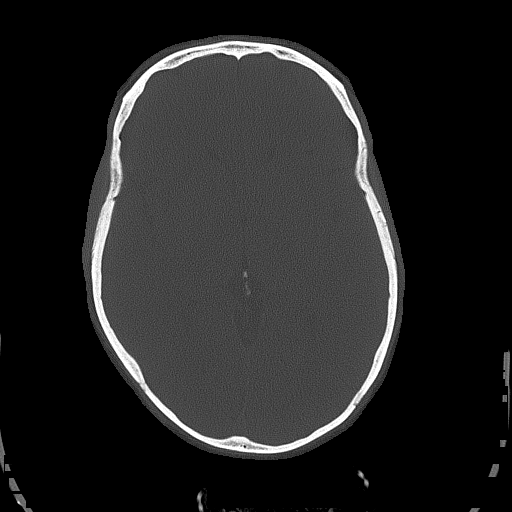

[Series 5: head without cor · coronal · non-contrast · 0.35mm/px · 3 of 69 slices shown]
[im 23/69  brain]
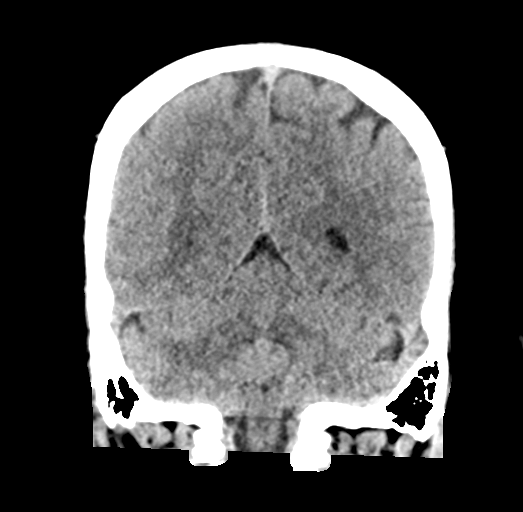
[im 31/69  brain]
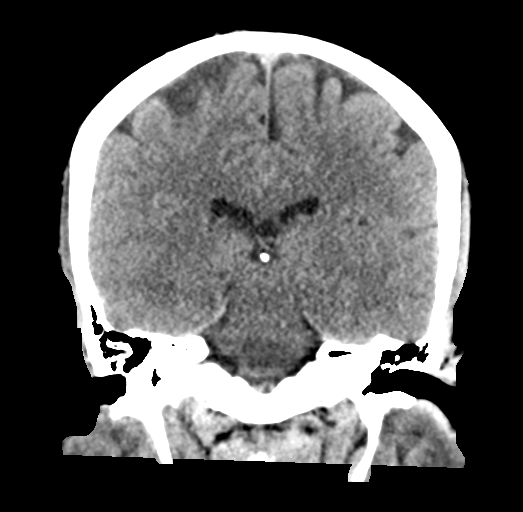
[im 38/69  brain]
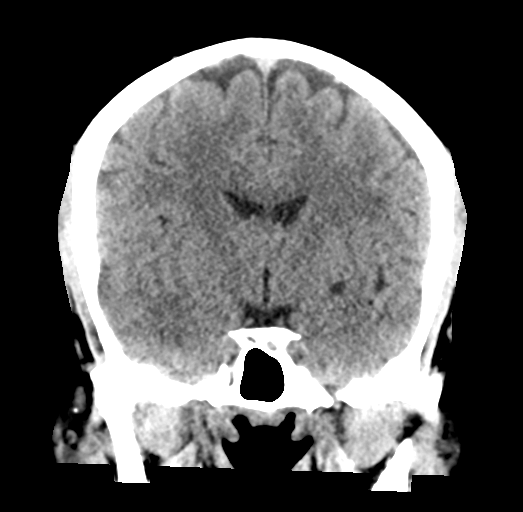

[Series 6: head without sag · sagittal · non-contrast · 0.35mm/px · 3 of 57 slices shown]
[im 19/57  brain]
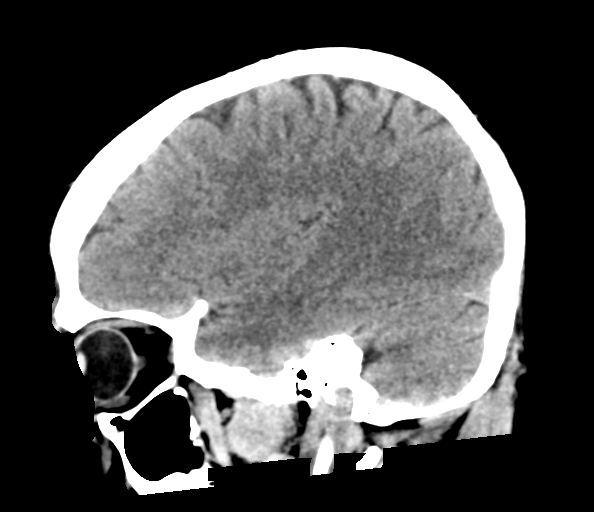
[im 29/57  brain]
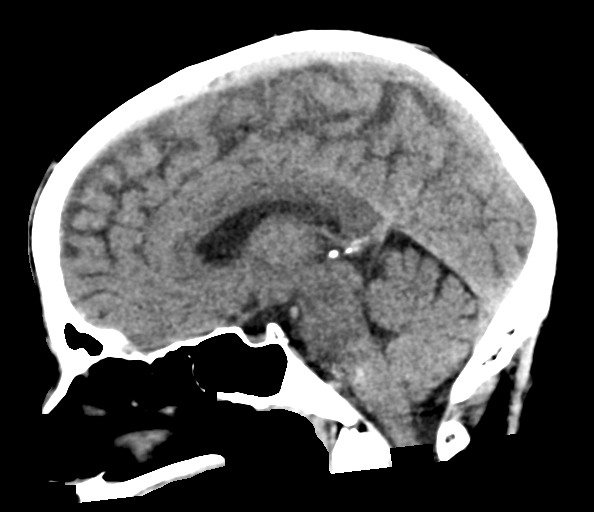
[im 38/57  brain]
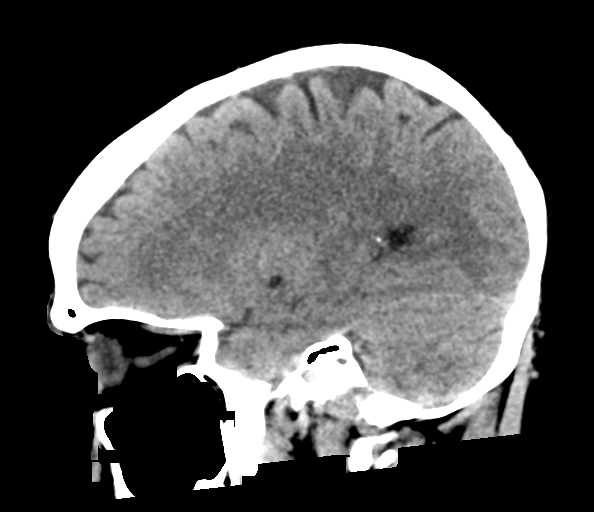

[17 of 47 positions shown; findings below may reference images not displayed]

FINDINGS: Brain: No acute infarct or hemorrhage. Focal hypodensity left basal
ganglia consistent with dilated perivascular space or prior lacunar
infarct. Lateral ventricles and remaining midline structures are
unremarkable. No acute extra-axial fluid collections. No mass
effect.

Vascular: No hyperdense vessel or unexpected calcification.

Skull: Normal. Negative for fracture or focal lesion.

Sinuses/Orbits: No acute finding.

Other: None.
IMPRESSION: 1. No acute intracranial process.
2. Dilated perivascular space versus chronic lacunar infarct within
the left basal ganglia.

## 2024-01-26 ENCOUNTER — Other Ambulatory Visit: Payer: Self-pay

## 2024-04-10 ENCOUNTER — Encounter (HOSPITAL_COMMUNITY): Payer: Self-pay

## 2024-04-10 ENCOUNTER — Emergency Department (HOSPITAL_COMMUNITY)
Admission: EM | Admit: 2024-04-10 | Discharge: 2024-04-10 | Disposition: A | Discharge status: Home / Self Care | Payer: Self-pay | Attending: Emergency Medicine | Admitting: Emergency Medicine

## 2024-04-10 ENCOUNTER — Other Ambulatory Visit: Payer: Self-pay

## 2024-04-10 ENCOUNTER — Emergency Department (HOSPITAL_COMMUNITY): Payer: Self-pay

## 2024-04-10 DIAGNOSIS — I2510 Atherosclerotic heart disease of native coronary artery without angina pectoris: Secondary | ICD-10-CM | POA: Insufficient documentation

## 2024-04-10 DIAGNOSIS — M25531 Pain in right wrist: Secondary | ICD-10-CM | POA: Insufficient documentation

## 2024-04-10 DIAGNOSIS — E119 Type 2 diabetes mellitus without complications: Secondary | ICD-10-CM | POA: Insufficient documentation

## 2024-04-10 DIAGNOSIS — Z7982 Long term (current) use of aspirin: Secondary | ICD-10-CM | POA: Insufficient documentation

## 2024-04-10 DIAGNOSIS — Z79899 Other long term (current) drug therapy: Secondary | ICD-10-CM | POA: Insufficient documentation

## 2024-04-10 DIAGNOSIS — I10 Essential (primary) hypertension: Secondary | ICD-10-CM | POA: Insufficient documentation

## 2024-04-10 DIAGNOSIS — Z87891 Personal history of nicotine dependence: Secondary | ICD-10-CM | POA: Insufficient documentation

## 2024-04-10 DIAGNOSIS — K0889 Other specified disorders of teeth and supporting structures: Secondary | ICD-10-CM | POA: Insufficient documentation

## 2024-04-10 MED ORDER — AMOXICILLIN-POT CLAVULANATE 875-125 MG PO TABS
1.0000 | ORAL_TABLET | Freq: Two times a day (BID) | ORAL | 0 refills | Status: AC
  Filled 2024-04-10: qty 14, 7d supply, fill #0

## 2024-04-10 MED ORDER — ACETAMINOPHEN 325 MG PO TABS
650.0000 mg | ORAL_TABLET | Freq: Once | ORAL | Status: AC
  Administered 2024-04-10: 650 mg via ORAL
  Filled 2024-04-10: qty 2

## 2024-04-10 MED ORDER — KETOROLAC TROMETHAMINE 15 MG/ML IJ SOLN
15.0000 mg | Freq: Once | INTRAMUSCULAR | Status: AC
  Administered 2024-04-10: 15 mg via INTRAMUSCULAR
  Filled 2024-04-10: qty 1

## 2024-04-10 NOTE — ED Triage Notes (Signed)
 Pt complains of left lower tooth abscess and right wrist pain that he has had for awhile. Denies specific injury

## 2024-04-10 NOTE — Discharge Instructions (Signed)
 Please use Tylenol or ibuprofen for pain.  You may use 600 mg ibuprofen every 6 hours or 1000 mg of Tylenol every 6 hours.  You may choose to alternate between the 2.  This would be most effective.  Not to exceed 4 g of Tylenol within 24 hours.  Not to exceed 3200 mg ibuprofen 24 hours.

## 2024-04-10 NOTE — ED Provider Notes (Signed)
 [place] EMERGENCY DEPARTMENT AT [facility] Provider Note   CSN: [id number] Arrival date & time: 04/10/24  [id number]     Patient presents with: Dental Pain   Bradley Wolfe. is a 53 y.o. male with past medical history sniffer hypertension, CAD, tobacco abuse, GERD, diabetes who presents with concern for 2 complaints today.  Patient reports that he has had some left lower tooth pain that has been hurting worse for the last week.  Has history of similar.  Does not have a dentist at this time.  Reports that he just got insurance.  Also reports that he has some right wrist pain for the last week.  No known injury.    Dental Pain      Prior to Admission medications   Medication Sig Start Date End Date Taking? Authorizing Provider  amoxicillin -clavulanate (AUGMENTIN ) 875-125 MG tablet Take 1 tablet by mouth every 12 (twelve) hours. 04/10/24  Yes Anel Purohit H, PA-C  albuterol  (VENTOLIN  HFA) 108 (90 Base) MCG/ACT inhaler Inhale 2 puffs into the lungs every 6 (six) hours as needed for wheezing or shortness of breath. 10/14/21   Fleming, Zelda W, NP  amLODipine  (NORVASC ) 10 MG tablet Take 1 tablet (10 mg total) by mouth daily FOR BLOOD PRESSURE. 05/04/23   Odie Benne, MD  amoxicillin  (AMOXIL ) 500 MG capsule Take 1 capsule (500 mg total) by mouth 3 (three) times daily. 11/15/23   Sherel Dikes, PA-C  aspirin  EC 81 MG tablet Take 1 tablet (81 mg total) by mouth daily. Swallow whole. 07/27/22   Fleming, Zelda W, NP  atorvastatin  (LIPITOR ) 80 MG tablet Take 1 tablet (80 mg total) by mouth daily FOR CHOLESTEROL 05/04/23   Odie Benne, MD  betamethasone  dipropionate 0.05 % cream Apply 1 Application topically 2 (two) times daily. 08/21/23   Fleming, Zelda W, NP  Blood Glucose Monitoring Suppl (TRUE METRIX METER) w/Device KIT Needs new meter. We tested the his current one with a new battery and it is defective. Patient will pick up scripts today. Patient  not taking: Reported on 10/10/2023 04/25/19   Fleming, Zelda W, NP  gabapentin  (NEURONTIN ) 600 MG tablet Take 1 tablet (600 mg total) by mouth 3 (three) times daily FOR LEG PAIN 10/10/23   Collins Dean, NP  glucose blood test strip Use as instructed Patient not taking: Reported on 10/10/2023 08/13/20   Vernell Goldsmith, MD  lisinopril  (ZESTRIL ) 40 MG tablet Take 1 tablet (40 mg total) by mouth daily FOR BLOOD PRESSURE 05/04/23   Odie Benne, MD  metoprolol  succinate (TOPROL -XL) 25 MG 24 hr tablet Take 0.5 tablets (12.5 mg total) by mouth daily for blood pressure. 07/11/23   Fleming, Zelda W, NP  nitroGLYCERIN  (NITROSTAT ) 0.4 MG SL tablet Place 1 tablet (0.4 mg total) under the tongue every 5 (five) minutes as needed for chest pain. Report to ER if you have taken  pills or more in 15 minute timeframe 12/18/21   Fleming, Zelda W, NP  omeprazole  (PRILOSEC ) 40 MG capsule Take 1 capsule (40 mg total) by mouth daily FOR ACID REFLUX 07/11/23   Collins Dean, NP  TRUEplus Lancets 26G MISC 1 each by Does not apply route every 8 (eight) hours as needed. Patient not taking: Reported on 10/10/2023 08/13/20   Vernell Goldsmith, MD    Allergies: Patient has no known allergies.    Review of Systems  All other systems reviewed and are negative.   Updated Vital Signs BP Aaron Aas)  196/97 (BP Location: Left Arm)   Pulse 92   Temp 98.3 F (36.8 C) (Oral)   Resp 18   SpO2 100%   Physical Exam Vitals and nursing note reviewed.  Constitutional:      General: He is not in acute distress.    Appearance: Normal appearance.  HENT:     Head: Normocephalic and atraumatic.     Mouth/Throat:     Comments: Broken teeth, halitosis, some mild redness of gums but no significant focal swelling or abscess noted.  No face swelling, no cellulitis.  No floor of mouth edema, uvula midline.  Swallow appropriate.  Eyes:     General:        Right eye: No discharge.        Left eye: No discharge.     Cardiovascular:     Rate and Rhythm: Normal rate and regular rhythm.  Pulmonary:     Effort: Pulmonary effort is normal. No respiratory distress.   Musculoskeletal:        General: No deformity.     Comments: Some tenderness to palpation of right wrist without step-off, deformity.  No significant focal swelling.  Normal range of motion to flexion, extension.  Negative Tinel's sign.   Skin:    General: Skin is warm and dry.   Neurological:     Mental Status: He is alert and oriented to person, place, and time.   Psychiatric:        Mood and Affect: Mood normal.        Behavior: Behavior normal.     (all labs ordered are listed, but only abnormal results are displayed) Labs Reviewed - No data to display  EKG: None  Radiology: DG Wrist Complete Right Result Date: 04/10/2024 CLINICAL DATA:  wrist pain EXAM: RIGHT WRIST - COMPLETE 3+ VIEW COMPARISON:  None Available. FINDINGS: There is no evidence of fracture or dislocation. There is no evidence of arthropathy or other focal bone abnormality. Soft tissues are unremarkable. IMPRESSION: Negative. Electronically Signed   By: Morgane  Naveau M.D.   On: 04/10/2024 01:54     Procedures   Medications Ordered in the ED  ketorolac  (TORADOL ) 15 MG/ML injection 15 mg (15 mg Intramuscular Given 04/10/24 0211)  acetaminophen  (TYLENOL ) tablet 650 mg (650 mg Oral Given 04/10/24 0213)                                    Medical Decision Making Amount and/or Complexity of Data Reviewed Radiology: ordered.  Risk OTC drugs. Prescription drug management.    This an overall well-appearing 53 y.o. male who presents with concern for dental pain.  Physical exam reveals cavities, broken teeth in mouth.  Patient with redness, gum swelling without evidence of gum abscess, periapical abscess, PTA, uvula deviation, pharyngitis, epiglottitis, dysphonia, stridor.  Patient without difficulty swallowing.  No systemic fever, chills.  Given patient's  symptoms think it is reasonable to treat with antibiotics.  Encouraged ibuprofen , Tylenol , Orajel, ice for pain control. Considered stronger pain control with toradol , tylenol  in ed. Encouraged urgent dental follow-up, dental resource guide provided.  Provided Augmentin  for antibiotic coverage.  Patient discharged in stable condition at this time, return precautions given.  On exam patient with overall normal appearance of wrist.  Suspect some tendinitis, plain film x-ray of the affected wrist shows no evidence of fracture, dislocation.  Will discharge with plan for ibuprofen , Tylenol , dentist and orthopedic  follow-up.  Pain somewhat improved in ED. additionally provided wrist brace for suspected chronic overuse injury.   Final diagnoses:  Pain, dental  Right wrist pain    ED Discharge Orders          Ordered    amoxicillin -clavulanate (AUGMENTIN ) 875-125 MG tablet  Every 12 hours        04/10/24 0223               Nishtha Raider H, PA-C 04/10/24 0226    Albertus Hughs, DO 04/10/24 [id number]

## 2024-04-19 ENCOUNTER — Other Ambulatory Visit: Payer: Self-pay

## 2024-05-09 ENCOUNTER — Other Ambulatory Visit: Payer: Self-pay

## 2024-05-09 ENCOUNTER — Other Ambulatory Visit: Payer: Self-pay | Admitting: Cardiovascular Disease

## 2024-05-09 DIAGNOSIS — I10 Essential (primary) hypertension: Secondary | ICD-10-CM

## 2024-05-10 ENCOUNTER — Other Ambulatory Visit: Payer: Self-pay

## 2024-05-11 ENCOUNTER — Other Ambulatory Visit: Payer: Self-pay

## 2024-05-11 MED ORDER — AMLODIPINE BESYLATE 10 MG PO TABS
10.0000 mg | ORAL_TABLET | Freq: Every day | ORAL | 0 refills | Status: AC
  Filled 2024-05-11: qty 90, 90d supply, fill #0
  Filled 2024-05-21: qty 30, 30d supply, fill #0
  Filled 2024-08-29 (×2): qty 30, 30d supply, fill #1

## 2024-05-11 MED ORDER — ATORVASTATIN CALCIUM 80 MG PO TABS
80.0000 mg | ORAL_TABLET | Freq: Every day | ORAL | 0 refills | Status: AC
  Filled 2024-05-11: qty 90, 90d supply, fill #0
  Filled 2024-05-21: qty 30, 30d supply, fill #0
  Filled 2024-08-29: qty 30, 30d supply, fill #1

## 2024-05-11 MED ORDER — LISINOPRIL 40 MG PO TABS
40.0000 mg | ORAL_TABLET | Freq: Every day | ORAL | 0 refills | Status: AC
  Filled 2024-05-11: qty 90, 90d supply, fill #0
  Filled 2024-05-21: qty 30, 30d supply, fill #0
  Filled 2024-08-29: qty 30, 30d supply, fill #1

## 2024-05-21 ENCOUNTER — Other Ambulatory Visit: Payer: Self-pay

## 2024-07-06 ENCOUNTER — Other Ambulatory Visit: Payer: Self-pay

## 2024-08-29 ENCOUNTER — Other Ambulatory Visit: Payer: Self-pay | Admitting: Nurse Practitioner

## 2024-08-29 ENCOUNTER — Other Ambulatory Visit: Payer: Self-pay

## 2024-08-29 DIAGNOSIS — K219 Gastro-esophageal reflux disease without esophagitis: Secondary | ICD-10-CM

## 2024-08-29 DIAGNOSIS — I10 Essential (primary) hypertension: Secondary | ICD-10-CM

## 2024-08-29 MED ORDER — METOPROLOL SUCCINATE ER 25 MG PO TB24
12.5000 mg | ORAL_TABLET | Freq: Every day | ORAL | 0 refills | Status: AC
  Filled 2024-08-29: qty 15, 30d supply, fill #0
